# Patient Record
Sex: Male | Born: 1950 | Race: White | Hispanic: No | Marital: Married | Smoking: Never smoker
Health system: Southern US, Community
[De-identification: ages and names within clinical notes are randomized; demographics above are authoritative.]

## PROBLEM LIST (undated history)

## (undated) DIAGNOSIS — M1990 Unspecified osteoarthritis, unspecified site: Secondary | ICD-10-CM

## (undated) DIAGNOSIS — C801 Malignant (primary) neoplasm, unspecified: Secondary | ICD-10-CM

## (undated) DIAGNOSIS — A419 Sepsis, unspecified organism: Secondary | ICD-10-CM

## (undated) DIAGNOSIS — I499 Cardiac arrhythmia, unspecified: Secondary | ICD-10-CM

## (undated) DIAGNOSIS — K2980 Duodenitis without bleeding: Secondary | ICD-10-CM

## (undated) DIAGNOSIS — F32A Depression, unspecified: Secondary | ICD-10-CM

## (undated) DIAGNOSIS — Z87442 Personal history of urinary calculi: Secondary | ICD-10-CM

## (undated) DIAGNOSIS — F329 Major depressive disorder, single episode, unspecified: Secondary | ICD-10-CM

## (undated) DIAGNOSIS — N390 Urinary tract infection, site not specified: Secondary | ICD-10-CM

## (undated) DIAGNOSIS — I480 Paroxysmal atrial fibrillation: Secondary | ICD-10-CM

## (undated) DIAGNOSIS — R972 Elevated prostate specific antigen [PSA]: Secondary | ICD-10-CM

## (undated) HISTORY — PX: VASECTOMY: SHX75

## (undated) HISTORY — DX: Duodenitis without bleeding: K29.80

## (undated) HISTORY — DX: Depression, unspecified: F32.A

## (undated) HISTORY — PX: JOINT REPLACEMENT: SHX530

## (undated) HISTORY — PX: PROSTATE BIOPSY: SHX241

## (undated) HISTORY — DX: Major depressive disorder, single episode, unspecified: F32.9

## (undated) HISTORY — DX: Elevated prostate specific antigen (PSA): R97.20

## (undated) HISTORY — PX: APPENDECTOMY: SHX54

## (undated) HISTORY — DX: Paroxysmal atrial fibrillation: I48.0

## (undated) HISTORY — DX: Sepsis, unspecified organism: A41.9

## (undated) HISTORY — DX: Urinary tract infection, site not specified: N39.0

---

## 2004-04-19 ENCOUNTER — Emergency Department (HOSPITAL_COMMUNITY): Admission: EM | Admit: 2004-04-19 | Discharge: 2004-04-19 | Payer: Self-pay | Admitting: Emergency Medicine

## 2011-11-27 ENCOUNTER — Encounter: Payer: Self-pay | Admitting: *Deleted

## 2011-11-27 DIAGNOSIS — G4700 Insomnia, unspecified: Secondary | ICD-10-CM

## 2011-11-27 DIAGNOSIS — E785 Hyperlipidemia, unspecified: Secondary | ICD-10-CM

## 2011-11-27 DIAGNOSIS — R972 Elevated prostate specific antigen [PSA]: Secondary | ICD-10-CM

## 2011-11-27 DIAGNOSIS — F329 Major depressive disorder, single episode, unspecified: Secondary | ICD-10-CM

## 2011-12-04 ENCOUNTER — Other Ambulatory Visit: Payer: Self-pay | Admitting: Emergency Medicine

## 2011-12-04 ENCOUNTER — Ambulatory Visit (INDEPENDENT_AMBULATORY_CARE_PROVIDER_SITE_OTHER): Payer: 59 | Admitting: Emergency Medicine

## 2011-12-04 VITALS — BP 130/83 | HR 83 | Temp 98.1°F | Resp 16 | Ht 68.0 in | Wt 171.2 lb

## 2011-12-04 DIAGNOSIS — F339 Major depressive disorder, recurrent, unspecified: Secondary | ICD-10-CM

## 2011-12-04 DIAGNOSIS — E782 Mixed hyperlipidemia: Secondary | ICD-10-CM

## 2011-12-04 DIAGNOSIS — Z0000 Encounter for general adult medical examination without abnormal findings: Secondary | ICD-10-CM

## 2011-12-04 LAB — POCT URINALYSIS DIPSTICK
Blood, UA: NEGATIVE
Nitrite, UA: NEGATIVE
Protein, UA: NEGATIVE
Urobilinogen, UA: 0.2
pH, UA: 7

## 2011-12-04 LAB — POCT UA - MICROSCOPIC ONLY
Bacteria, U Microscopic: NEGATIVE
Casts, Ur, LPF, POC: NEGATIVE
Crystals, Ur, HPF, POC: NEGATIVE
Yeast, UA: NEGATIVE

## 2011-12-04 LAB — IFOBT (OCCULT BLOOD): IFOBT: NEGATIVE

## 2011-12-04 MED ORDER — ATORVASTATIN CALCIUM 10 MG PO TABS: 10.0000 mg | ORAL_TABLET | Freq: Every day | ORAL | Status: DC

## 2011-12-04 MED ORDER — PAROXETINE HCL 30 MG PO TABS: 30.0000 mg | ORAL_TABLET | ORAL | Status: DC

## 2011-12-04 MED ORDER — AMITRIPTYLINE HCL 25 MG PO TABS: 25.0000 mg | ORAL_TABLET | Freq: Every day | ORAL | Status: DC

## 2011-12-04 MED ORDER — ZOLPIDEM TARTRATE 10 MG PO TABS: 10.0000 mg | ORAL_TABLET | Freq: Every evening | ORAL | Status: DC | PRN

## 2011-12-04 MED ORDER — ASPIRIN 81 MG PO TABS: 81.0000 mg | ORAL_TABLET | Freq: Every day | ORAL | Status: DC

## 2011-12-04 MED ORDER — BUTALBITAL-APAP-CAFF-COD 50-325-40-30 MG PO CAPS: 1.0000 | ORAL_CAPSULE | ORAL | Status: DC | PRN

## 2011-12-04 NOTE — Progress Notes (Signed)
  Subjective:    Patient ID: Brian [name], male    DOB: November 28, 1950, 61 y.o.   MRN: [id number]  HPI patient enters for his annual physical exam    Review of Systems  Constitutional: Negative.   HENT: Negative.   Eyes: Positive for visual disturbance.       Patient wears glasses.  Respiratory: Negative.   Cardiovascular: Negative.   Gastrointestinal: Negative.   Genitourinary: Negative.        Patient is aware he has an elevated PSA. His PSA had fallen on his last check. We are rechecking a PSA today if it shows a trend of thenar will advise a urological consult. His PSAs have gone from 1.95-2.7 want but on repeat was down to 2.48.  Musculoskeletal: Negative.   Neurological:       Patient has intermittent problems with headaches but they are not debilitating. He takes over-the-counter medications for these.  Hematological: Negative.   Psychiatric/Behavioral:       Currently under treatment for depression with SSRIs.       Objective:   Physical Exam  Constitutional: He appears well-developed and well-nourished.  HENT:  Head: Normocephalic.  Right Ear: External ear normal.  Left Ear: External ear normal.  Nose: Nose normal.  Mouth/Throat: Oropharynx is clear and moist.  Eyes: Conjunctivae and EOM are normal. Pupils are equal, round, and reactive to light. Right eye exhibits no discharge. Left eye exhibits no discharge. No scleral icterus.  Neck: No JVD present. No tracheal deviation present. No thyromegaly present.  Cardiovascular: Normal rate, regular rhythm, normal heart sounds and intact distal pulses.  Exam reveals no gallop and no friction rub.   No murmur heard. Pulmonary/Chest: Breath sounds normal. No stridor. No respiratory distress. He has no wheezes. He has no rales. He exhibits no tenderness.  Genitourinary: Prostate normal. Guaiac negative stool. No penile tenderness.  Musculoskeletal: Normal range of motion. He exhibits no edema and no tenderness.  Lymphadenopathy:     He has no cervical adenopathy.  Neurological: He has normal reflexes. He displays normal reflexes. No cranial nerve deficit. He exhibits normal muscle tone. Coordination normal.  Skin: Skin is warm.  Psychiatric: He has a normal mood and affect. His behavior is normal. Judgment and thought content normal.          Assessment & Plan:  Patient is stable on his current medication regimen. His EKG was done that showed borderline left axis hemiblock but otherwise was unremarkable except for one PAC. His biggest issue has been a which on repeat last time was in normal range. He does suffer from depression which is well controlled with Paxil currently on Lipitor for lipid control. He understands that if his PSA spikes he will need to see the urologist.

## 2011-12-05 LAB — COMPREHENSIVE METABOLIC PANEL
Albumin: 4.5 g/dL (ref 3.5–5.2)
Alkaline Phosphatase: 104 U/L (ref 39–117)
BUN: 16 mg/dL (ref 6–23)
Calcium: 9.7 mg/dL (ref 8.4–10.5)
Glucose, Bld: 94 mg/dL (ref 70–99)
Potassium: 4.2 mEq/L (ref 3.5–5.3)

## 2011-12-05 LAB — CBC WITH DIFFERENTIAL/PLATELET
Basophils Relative: 0 % (ref 0–1)
Eosinophils Absolute: 0.1 10*3/uL (ref 0.0–0.7)
Eosinophils Relative: 2 % (ref 0–5)
HCT: 44.9 % (ref 39.0–52.0)
Hemoglobin: 15.4 g/dL (ref 13.0–17.0)
MCH: 29.8 pg (ref 26.0–34.0)
MCHC: 34.3 g/dL (ref 30.0–36.0)
MCV: 87 fL (ref 78.0–100.0)
Monocytes Absolute: 0.3 10*3/uL (ref 0.1–1.0)
Monocytes Relative: 5 % (ref 3–12)
Neutro Abs: 3.3 10*3/uL (ref 1.7–7.7)

## 2011-12-05 LAB — LIPID PANEL
HDL: 47 mg/dL (ref >39)
LDL Cholesterol: 91 mg/dL (ref 0–99)
Triglycerides: 173 mg/dL — ABNORMAL HIGH (ref <150)

## 2011-12-05 LAB — TSH: TSH: 2.341 u[IU]/mL (ref 0.350–4.500)

## 2011-12-29 ENCOUNTER — Other Ambulatory Visit: Payer: Self-pay | Admitting: Emergency Medicine

## 2012-08-07 ENCOUNTER — Telehealth: Payer: Self-pay | Admitting: *Deleted

## 2012-08-07 DIAGNOSIS — Z0000 Encounter for general adult medical examination without abnormal findings: Secondary | ICD-10-CM

## 2012-08-07 NOTE — Telephone Encounter (Signed)
Pharmacy requesting refill on Ambien 10mg  1 at bedtime.  Last filled 12/29/11.

## 2012-08-08 NOTE — Telephone Encounter (Signed)
It is okay to refill the patient's Ambien one at bedtime 10 mg. He can have 5 refills.

## 2012-08-09 MED ORDER — ZOLPIDEM TARTRATE 10 MG PO TABS: 10.0000 mg | ORAL_TABLET | Freq: Every evening | ORAL | Status: DC | PRN

## 2012-08-10 ENCOUNTER — Telehealth: Payer: Self-pay | Admitting: Family Medicine

## 2012-08-10 NOTE — Telephone Encounter (Signed)
Called in [place] 10mg  1 po qhs prn #[phone] 5 refills [facility] [address]

## 2012-12-09 ENCOUNTER — Encounter: Payer: Self-pay | Admitting: Emergency Medicine

## 2012-12-09 ENCOUNTER — Ambulatory Visit (INDEPENDENT_AMBULATORY_CARE_PROVIDER_SITE_OTHER): Payer: 59 | Admitting: Emergency Medicine

## 2012-12-09 VITALS — BP 114/90 | HR 81 | Temp 97.9°F | Resp 16 | Ht 67.75 in | Wt 168.0 lb

## 2012-12-09 DIAGNOSIS — F32A Depression, unspecified: Secondary | ICD-10-CM

## 2012-12-09 DIAGNOSIS — F329 Major depressive disorder, single episode, unspecified: Secondary | ICD-10-CM

## 2012-12-09 DIAGNOSIS — Z0000 Encounter for general adult medical examination without abnormal findings: Secondary | ICD-10-CM

## 2012-12-09 DIAGNOSIS — F341 Dysthymic disorder: Secondary | ICD-10-CM

## 2012-12-09 DIAGNOSIS — R51 Headache: Secondary | ICD-10-CM

## 2012-12-09 DIAGNOSIS — Z79899 Other long term (current) drug therapy: Secondary | ICD-10-CM

## 2012-12-09 DIAGNOSIS — G4700 Insomnia, unspecified: Secondary | ICD-10-CM

## 2012-12-09 DIAGNOSIS — E7800 Pure hypercholesterolemia, unspecified: Secondary | ICD-10-CM

## 2012-12-09 DIAGNOSIS — E782 Mixed hyperlipidemia: Secondary | ICD-10-CM

## 2012-12-09 LAB — CBC WITH DIFFERENTIAL/PLATELET
Basophils Absolute: 0 10*3/uL (ref 0.0–0.1)
Basophils Relative: 1 % (ref 0–1)
Eosinophils Absolute: 0.1 10*3/uL (ref 0.0–0.7)
Eosinophils Relative: 2 % (ref 0–5)
HCT: 43.2 % (ref 39.0–52.0)
MCHC: 35 g/dL (ref 30.0–36.0)
MCV: 81.4 fL (ref 78.0–100.0)
Monocytes Absolute: 0.4 10*3/uL (ref 0.1–1.0)
RDW: 13.9 % (ref 11.5–15.5)

## 2012-12-09 LAB — POCT URINALYSIS DIPSTICK
Protein, UA: NEGATIVE
Spec Grav, UA: 1.02
Urobilinogen, UA: 0.2

## 2012-12-09 LAB — LIPID PANEL
HDL: 49 mg/dL (ref >39)
LDL Cholesterol: 86 mg/dL (ref 0–99)
Triglycerides: 93 mg/dL (ref <150)
VLDL: 19 mg/dL (ref 0–40)

## 2012-12-09 LAB — IFOBT (OCCULT BLOOD): IFOBT: NEGATIVE

## 2012-12-09 LAB — COMPREHENSIVE METABOLIC PANEL
AST: 23 U/L (ref 0–37)
Alkaline Phosphatase: 105 U/L (ref 39–117)
BUN: 13 mg/dL (ref 6–23)
Calcium: 9.5 mg/dL (ref 8.4–10.5)
Creat: 0.93 mg/dL (ref 0.50–1.35)

## 2012-12-09 LAB — POCT UA - MICROSCOPIC ONLY
Bacteria, U Microscopic: NEGATIVE
Casts, Ur, LPF, POC: NEGATIVE
Crystals, Ur, HPF, POC: NEGATIVE
Yeast, UA: NEGATIVE

## 2012-12-09 MED ORDER — ATORVASTATIN CALCIUM 10 MG PO TABS: 10.0000 mg | ORAL_TABLET | Freq: Every day | ORAL | Status: DC

## 2012-12-09 MED ORDER — AMITRIPTYLINE HCL 25 MG PO TABS: 25.0000 mg | ORAL_TABLET | Freq: Every day | ORAL | Status: DC

## 2012-12-09 MED ORDER — BUTALBITAL-APAP-CAFF-COD 50-325-40-30 MG PO CAPS: 1.0000 | ORAL_CAPSULE | ORAL | Status: DC | PRN

## 2012-12-09 MED ORDER — ZOLPIDEM TARTRATE 10 MG PO TABS: 10.0000 mg | ORAL_TABLET | Freq: Every evening | ORAL | Status: DC | PRN

## 2012-12-09 MED ORDER — PAROXETINE HCL 30 MG PO TABS
ORAL_TABLET | ORAL | Status: DC
Start: 2012-12-09 — End: 2013-12-22

## 2012-12-09 NOTE — Progress Notes (Signed)
  Subjective:    Patient ID: Kamilo Och, male    DOB: [birth year], 62 y.o.   MRN: [id number]  HPI    Review of Systems  Constitutional: Negative.   HENT: Negative.   Eyes: Negative.   Respiratory: Negative.   Cardiovascular: Negative.   Gastrointestinal: Negative.   Endocrine: Negative.   Genitourinary: Positive for frequency.  Musculoskeletal: Negative.   Skin: Negative.   Allergic/Immunologic: Negative.   Neurological: Negative.   Hematological: Negative.   Psychiatric/Behavioral: Negative.        Objective:   Physical Exam HEENT exam is unremarkable. Neck is supple no carotid bruits are heard. Chest is clear to auscultation and percussion. Heart regular rate no murmurs rubs or gallops appreciated. Abdomen is soft liver and spleen not enlarged there are no areas of tenderness. Rectal exam shows a normal-sized prostate no rectal masses are felt . Neurological exam is normal cranial nerves II through XII intact motor 5 out of 5 all muscle groups deep tendon reflexes are symmetrical. Skin exam reveals no suspicious lesions        Assessment & Plan:  Patient stable as present. Meds were all refilled.

## 2013-04-15 ENCOUNTER — Other Ambulatory Visit: Payer: Self-pay | Admitting: Emergency Medicine

## 2013-09-22 ENCOUNTER — Ambulatory Visit (INDEPENDENT_AMBULATORY_CARE_PROVIDER_SITE_OTHER): Payer: 59 | Admitting: Physician Assistant

## 2013-09-22 VITALS — BP 130/80 | HR 103 | Temp 99.2°F | Resp 18 | Ht 68.0 in | Wt 167.0 lb

## 2013-09-22 DIAGNOSIS — J029 Acute pharyngitis, unspecified: Secondary | ICD-10-CM

## 2013-09-22 LAB — POCT RAPID STREP A (OFFICE): RAPID STREP A SCREEN: NEGATIVE

## 2013-09-22 MED ORDER — BENZONATATE 100 MG PO CAPS: 100.0000 mg | ORAL_CAPSULE | Freq: Three times a day (TID) | ORAL | Status: DC | PRN

## 2013-09-22 MED ORDER — CETIRIZINE HCL 10 MG PO TABS: 10.0000 mg | ORAL_TABLET | Freq: Every day | ORAL | Status: DC

## 2013-09-22 MED ORDER — FIRST-DUKES MOUTHWASH MT SUSP: 10.0000 mL | OROMUCOSAL | Status: DC | PRN

## 2013-09-22 NOTE — Progress Notes (Signed)
   Subjective:    Patient ID: Brian [name], male    DOB: 10/30/1950, 63 y.o.   MRN: [id number]  HPI   Brian [name] a very pleasant 63 yr old male here with concern for illness.  Reports onset of sore throat 3 days ago.  Worsening since that time.  Swallowing [name] painful.  Has  Been unable to eat due to pain.  Difficulty sleeping due to pain.  Periodic cough and some nasal drainage.  Felt achy but not feverish.  Low grad temp here at triage.  Possibly some PND.  No drooling, voice change.  No known sick contacts.  Was with grandchildren at Christmas but they were/are well.  Has been sucking on candy to keep throat moist.  Has taken a couple excedrin for achiness.  No flu shot this season.   Review of Systems  Constitutional: Negative for fever and chills.  HENT: Positive for congestion (slight), rhinorrhea (slight) and sore throat.   Respiratory: Positive for cough (slight).   Cardiovascular: Negative.   Gastrointestinal: Negative.   Musculoskeletal: Negative.   Skin: Negative.   Neurological: Negative for headaches.       Objective:   Physical Exam  Vitals reviewed. Constitutional: He [name] oriented to person, place, and time. He appears well-developed and well-nourished. No distress.  HENT:  Head: Normocephalic and atraumatic.  Right Ear: Tympanic membrane and ear canal normal.  Left Ear: Tympanic membrane and ear canal normal.  Mouth/Throat: Uvula [name] midline and mucous membranes are normal. Posterior oropharyngeal edema and posterior oropharyngeal erythema present. No oropharyngeal exudate or tonsillar abscesses.  Eyes: Conjunctivae are normal. No scleral icterus.  Neck: Neck supple.  Cardiovascular: Normal rate, regular rhythm and normal heart sounds.   Pulmonary/Chest: Effort normal and breath sounds normal. He has no wheezes. He has no rales.  Lymphadenopathy:    He has no cervical adenopathy.  Neurological: He [name] alert and oriented to person, place, and time.  Skin: Skin [name] warm  and dry.  Psychiatric: He has a normal mood and affect. His behavior [name] normal.   Results for orders placed in visit on 09/22/13  POCT RAPID STREP A (OFFICE)      Result Value Range   Rapid Strep A Screen Negative  Negative        Assessment & Plan:  Acute pharyngitis - Plan: POCT rapid strep A, Culture, Group A Strep, Diphenhyd-Hydrocort-Nystatin (FIRST-DUKES MOUTHWASH) SUSP, cetirizine (ZYRTEC) 10 MG tablet   Brian [name] a pleasant 63 yr old male here with acute pharyngitis.  Rapid strep [name] negative.  Cx pending.  Suspect viral etiology due to associated congestion, cough, mild laryngitis.  Supportive care with Zyrtec, Advil, Magic Mouthwash. Tessalon Perles if needed for coug.  Push fluids, rest.  Call or RTC if worsening or not improving.  Will follow up on cx results and treat if necessary.   Loleta DickerE. Elizabeth Saraiah Bhat MHS, PA-C [facility] 1/6/20151:10 PM

## 2013-09-22 NOTE — Patient Instructions (Signed)
Strep test is negative today.  I will let you know when your culture is back.  I suspect your symptoms are due to a viral throat infection.  Begin taking the cetirizine once daily to help with any post-nasal drainage.  Take ibuprofen 600-800mg  every 8 hours with food to help with throat pain.  Use the magic mouthwash every 2 hours as needed for throat pain.  Plenty of fluids (water is best!) and rest.  If anything is worsening or not improving, please let us know   Viral Pharyngitis Viral pharyngitis is a viral infection that produces redness, pain, and swelling (inflammation) of the throat. It can spread from person to person (contagious). CAUSES Viral pharyngitis is caused by inhaling a large amount of certain germs called viruses. Many different viruses cause viral pharyngitis. SYMPTOMS Symptoms of viral pharyngitis include:  Sore throat.  Tiredness.  Stuffy nose.  Low-grade fever.  Congestion.  Cough. TREATMENT Treatment includes rest, drinking plenty of fluids, and the use of over-the-counter medication (approved by your caregiver). HOME CARE INSTRUCTIONS   Drink enough fluids to keep your urine clear or pale yellow.  Eat soft, cold foods such as ice cream, frozen ice pops, or gelatin dessert.  Gargle with warm salt water (1 tsp salt per 1 qt of water).  If over age 647, throat lozenges may be used safely.  Only take over-the-counter or prescription medicines for pain, discomfort, or fever as directed by your caregiver. Do not take aspirin. To help prevent spreading viral pharyngitis to others, avoid:  Mouth-to-mouth contact with others.  Sharing utensils for eating and drinking.  Coughing around others. SEEK MEDICAL CARE IF:   You are better in a few days, then become worse.  You have a fever or pain not helped by pain medicines.  There are any other changes that concern you. Document Released: 06/13/2005 Document Revised: 11/26/2011 Document Reviewed:  11/09/2010 [facility] Patient Information 2014 [employer], [place].

## 2013-09-24 LAB — CULTURE, GROUP A STREP: Organism ID, Bacteria: NORMAL

## 2013-11-17 ENCOUNTER — Encounter: Payer: 59 | Admitting: Emergency Medicine

## 2013-12-22 ENCOUNTER — Encounter: Payer: Self-pay | Admitting: Emergency Medicine

## 2013-12-22 ENCOUNTER — Ambulatory Visit (INDEPENDENT_AMBULATORY_CARE_PROVIDER_SITE_OTHER): Payer: 59 | Admitting: Emergency Medicine

## 2013-12-22 VITALS — BP 112/82 | HR 82 | Temp 98.5°F | Resp 16 | Ht 67.5 in | Wt 166.0 lb

## 2013-12-22 DIAGNOSIS — Z79899 Other long term (current) drug therapy: Secondary | ICD-10-CM

## 2013-12-22 DIAGNOSIS — F329 Major depressive disorder, single episode, unspecified: Secondary | ICD-10-CM

## 2013-12-22 DIAGNOSIS — E7800 Pure hypercholesterolemia, unspecified: Secondary | ICD-10-CM

## 2013-12-22 DIAGNOSIS — Z23 Encounter for immunization: Secondary | ICD-10-CM

## 2013-12-22 DIAGNOSIS — E785 Hyperlipidemia, unspecified: Secondary | ICD-10-CM

## 2013-12-22 DIAGNOSIS — F32A Depression, unspecified: Secondary | ICD-10-CM

## 2013-12-22 DIAGNOSIS — Z0000 Encounter for general adult medical examination without abnormal findings: Secondary | ICD-10-CM

## 2013-12-22 DIAGNOSIS — G4700 Insomnia, unspecified: Secondary | ICD-10-CM

## 2013-12-22 LAB — CBC WITH DIFFERENTIAL/PLATELET
Basophils Absolute: 0.1 10*3/uL (ref 0.0–0.1)
Basophils Relative: 1 % (ref 0–1)
Eosinophils Absolute: 0.1 10*3/uL (ref 0.0–0.7)
Eosinophils Relative: 2 % (ref 0–5)
HEMATOCRIT: 42.3 % (ref 39.0–52.0)
HEMOGLOBIN: 14.8 g/dL (ref 13.0–17.0)
LYMPHS PCT: 38 % (ref 12–46)
Lymphs Abs: 2.1 10*3/uL (ref 0.7–4.0)
MCH: 29.8 pg (ref 26.0–34.0)
MCHC: 35 g/dL (ref 30.0–36.0)
MCV: 85.1 fL (ref 78.0–100.0)
MONO ABS: 0.4 10*3/uL (ref 0.1–1.0)
MONOS PCT: 7 % (ref 3–12)
Neutro Abs: 2.8 10*3/uL (ref 1.7–7.7)
Neutrophils Relative %: 52 % (ref 43–77)
Platelets: 197 10*3/uL (ref 150–400)
RBC: 4.97 MIL/uL (ref 4.22–5.81)
RDW: 14.4 % (ref 11.5–15.5)
WBC: 5.4 10*3/uL (ref 4.0–10.5)

## 2013-12-22 LAB — POCT UA - MICROSCOPIC ONLY
CRYSTALS, UR, HPF, POC: NEGATIVE
Casts, Ur, LPF, POC: NEGATIVE
Mucus, UA: POSITIVE
YEAST UA: NEGATIVE

## 2013-12-22 LAB — COMPLETE METABOLIC PANEL WITH GFR
ALBUMIN: 4.3 g/dL (ref 3.5–5.2)
ALT: 17 U/L (ref 0–53)
AST: 20 U/L (ref 0–37)
Alkaline Phosphatase: 97 U/L (ref 39–117)
BUN: 16 mg/dL (ref 6–23)
CALCIUM: 9.5 mg/dL (ref 8.4–10.5)
CHLORIDE: 103 meq/L (ref 96–112)
CO2: 24 mEq/L (ref 19–32)
CREATININE: 0.91 mg/dL (ref 0.50–1.35)
GLUCOSE: 90 mg/dL (ref 70–99)
POTASSIUM: 4.2 meq/L (ref 3.5–5.3)
Sodium: 137 mEq/L (ref 135–145)
Total Bilirubin: 0.5 mg/dL (ref 0.2–1.2)
Total Protein: 6.6 g/dL (ref 6.0–8.3)

## 2013-12-22 LAB — POCT URINALYSIS DIPSTICK
Blood, UA: NEGATIVE
Glucose, UA: NEGATIVE
Ketones, UA: NEGATIVE
Leukocytes, UA: NEGATIVE
NITRITE UA: NEGATIVE
PROTEIN UA: 30
UROBILINOGEN UA: 0.2
pH, UA: 5.5

## 2013-12-22 LAB — LIPID PANEL
Cholesterol: 131 mg/dL (ref 0–200)
HDL: 46 mg/dL (ref >39)
LDL Cholesterol: 66 mg/dL (ref 0–99)
TRIGLYCERIDES: 94 mg/dL (ref <150)
Total CHOL/HDL Ratio: 2.8 Ratio
VLDL: 19 mg/dL (ref 0–40)

## 2013-12-22 LAB — IFOBT (OCCULT BLOOD): IFOBT: NEGATIVE

## 2013-12-22 MED ORDER — PAROXETINE HCL 30 MG PO TABS: ORAL_TABLET | ORAL | Status: DC

## 2013-12-22 MED ORDER — ZOLPIDEM TARTRATE 10 MG PO TABS: ORAL_TABLET | ORAL | Status: DC

## 2013-12-22 MED ORDER — ATORVASTATIN CALCIUM 10 MG PO TABS: 10.0000 mg | ORAL_TABLET | Freq: Every day | ORAL | Status: DC

## 2013-12-22 MED ORDER — BUTALBITAL-APAP-CAFFEINE 50-325-40 MG PO TABS: 1.0000 | ORAL_TABLET | Freq: Four times a day (QID) | ORAL | Status: DC | PRN

## 2013-12-22 MED ORDER — AMITRIPTYLINE HCL 25 MG PO TABS: 25.0000 mg | ORAL_TABLET | Freq: Every day | ORAL | Status: DC

## 2013-12-22 NOTE — Progress Notes (Signed)
  This chart was scribed for Collene GobbleSteven A Lorretta Kerce, MD by Joaquin MusicKristina Sanchez-Matthews, ED Scribe. This patient was seen in room Room/bed 22 and the patient's care was started at 4:14 PM;. Subjective:    Patient ID: Brian BellowWilliam Segovia, male    DOB: 05/04/1951, 63 y.o.   MRN: [id number] Chief Complaint  Patient presents with  . Annual Exam   HPI Brian BellowWilliam Bottoms is a 63 y.o. male [facility]. 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 He states he is in need of a tetanus shot due to his last tetanus being 11 years ago. Pt states he has HA about once every 2 weeks and reports taking OTC medications with relief. Pt denies having a stress test, CP, palpitations, and cardiac work-up.  Review of Systems  Constitutional: Negative for activity change.  Cardiovascular: Negative for chest pain and palpitations.  Neurological: Positive for headaches.  All other systems reviewed and are negative.   Objective:   Physical Exam CONSTITUTIONAL: Well developed/well nourished HEAD: Normocephalic/atraumatic EYES: EOMI/PERRL ENMT: Mucous membranes moist NECK: supple no meningeal signs SPINE:entire spine nontender CV: S1/S2 noted, no murmurs/rubs/gallops noted LUNGS: Lungs are clear to auscultation bilaterally, no apparent distress ABDOMEN: soft, nontender, no rebound or guarding GU:no cva tenderness. Rectal and prostate exam was all normal. NEURO: Pt is awake/alert, moves all extremitiesx4 EXTREMITIES: pulses normal, full ROM SKIN: warm, color normal PSYCH: no abnormalities of mood noted   Triage Vitals:BP 112/82  Pulse 82  Temp(Src) 98.5 F (36.9 C) (Oral)  Resp 16  Ht 5' 7.5" (1.715 m)  Wt 166 lb (75.297 kg)  BMI 25.60 kg/m2  SpO2 96% Assessment & Plan:      I personally performed the services described in this documentation, which was scribed in my  presence. The recorded information has been reviewed and is accurate.

## 2013-12-23 LAB — PSA: PSA: 1.87 ng/mL (ref <=4.00)

## 2014-01-22 HISTORY — PX: CARDIOVASCULAR STRESS TEST: SHX262

## 2014-02-15 HISTORY — PX: TRANSTHORACIC ECHOCARDIOGRAM: SHX275

## 2014-03-24 ENCOUNTER — Encounter: Payer: Self-pay | Admitting: Emergency Medicine

## 2014-05-10 ENCOUNTER — Telehealth: Payer: Self-pay

## 2014-05-10 DIAGNOSIS — G4700 Insomnia, unspecified: Secondary | ICD-10-CM

## 2014-05-10 MED ORDER — AMITRIPTYLINE HCL 25 MG PO TABS: 25.0000 mg | ORAL_TABLET | Freq: Every day | ORAL | Status: DC

## 2014-05-10 NOTE — Telephone Encounter (Signed)
[name] states her husband have been trying to get a 3 months supply of his Elavil s for a while and hasn't heard from anyone. Please call [phone]    [employer]

## 2014-05-10 NOTE — Telephone Encounter (Signed)
Okay to refill all medicines for 90 days 

## 2014-05-10 NOTE — Telephone Encounter (Signed)
Pt will be in for his annual CPE in October- insurance now requires 90 supply. Please advise.

## 2014-05-13 MED ORDER — AMITRIPTYLINE HCL 25 MG PO TABS: 25.0000 mg | ORAL_TABLET | Freq: Every day | ORAL | Status: DC

## 2014-05-13 NOTE — Addendum Note (Signed)
Addended by: Sheppard Plumber A on: 05/13/2014 01:08 PM   Modules accepted: Orders

## 2014-05-13 NOTE — Telephone Encounter (Signed)
Sent refill for 90 day supply and notified pt's wife.

## 2014-08-13 ENCOUNTER — Other Ambulatory Visit: Payer: Self-pay | Admitting: Emergency Medicine

## 2014-08-25 ENCOUNTER — Other Ambulatory Visit: Payer: Self-pay | Admitting: Emergency Medicine

## 2014-08-27 NOTE — Telephone Encounter (Signed)
Faxed

## 2014-12-20 ENCOUNTER — Telehealth: Payer: Self-pay | Admitting: Family Medicine

## 2014-12-20 NOTE — Telephone Encounter (Signed)
lmom to give us a call back to the appt. Center his new appt date and time is with Dr Cleta Albertsaub on Friday 12-24-14 at 9:15 Dr Cleta Albertsaub will be out of clinic on Thursday so we're moving his patient to Friday

## 2014-12-23 ENCOUNTER — Encounter: Payer: Self-pay | Admitting: Emergency Medicine

## 2014-12-24 ENCOUNTER — Encounter: Payer: Self-pay | Admitting: Emergency Medicine

## 2014-12-24 ENCOUNTER — Other Ambulatory Visit: Payer: Self-pay | Admitting: Emergency Medicine

## 2014-12-24 ENCOUNTER — Ambulatory Visit (INDEPENDENT_AMBULATORY_CARE_PROVIDER_SITE_OTHER): Payer: 59 | Admitting: Emergency Medicine

## 2014-12-24 VITALS — BP 123/83 | HR 89 | Temp 97.9°F | Resp 16 | Ht 68.0 in | Wt 169.0 lb

## 2014-12-24 DIAGNOSIS — F329 Major depressive disorder, single episode, unspecified: Secondary | ICD-10-CM

## 2014-12-24 DIAGNOSIS — E7800 Pure hypercholesterolemia, unspecified: Secondary | ICD-10-CM

## 2014-12-24 DIAGNOSIS — Z0000 Encounter for general adult medical examination without abnormal findings: Secondary | ICD-10-CM | POA: Diagnosis not present

## 2014-12-24 DIAGNOSIS — R972 Elevated prostate specific antigen [PSA]: Secondary | ICD-10-CM

## 2014-12-24 DIAGNOSIS — F32A Depression, unspecified: Secondary | ICD-10-CM

## 2014-12-24 LAB — CBC WITH DIFFERENTIAL/PLATELET
BASOS PCT: 1 % (ref 0–1)
Basophils Absolute: 0.1 10*3/uL (ref 0.0–0.1)
EOS ABS: 0.2 10*3/uL (ref 0.0–0.7)
Eosinophils Relative: 3 % (ref 0–5)
HCT: 42.6 % (ref 39.0–52.0)
Hemoglobin: 14.4 g/dL (ref 13.0–17.0)
Lymphocytes Relative: 33 % (ref 12–46)
Lymphs Abs: 1.9 10*3/uL (ref 0.7–4.0)
MCH: 29.6 pg (ref 26.0–34.0)
MCHC: 33.8 g/dL (ref 30.0–36.0)
MCV: 87.7 fL (ref 78.0–100.0)
MPV: 10.1 fL (ref 8.6–12.4)
Monocytes Absolute: 0.5 10*3/uL (ref 0.1–1.0)
Monocytes Relative: 8 % (ref 3–12)
NEUTROS PCT: 55 % (ref 43–77)
Neutro Abs: 3.2 10*3/uL (ref 1.7–7.7)
Platelets: 200 10*3/uL (ref 150–400)
RBC: 4.86 MIL/uL (ref 4.22–5.81)
RDW: 14.6 % (ref 11.5–15.5)
WBC: 5.8 10*3/uL (ref 4.0–10.5)

## 2014-12-24 LAB — POCT URINALYSIS DIPSTICK
Bilirubin, UA: NEGATIVE
GLUCOSE UA: NEGATIVE
KETONES UA: NEGATIVE
NITRITE UA: NEGATIVE
Protein, UA: NEGATIVE
Spec Grav, UA: 1.015
Urobilinogen, UA: 0.2
pH, UA: 7

## 2014-12-24 LAB — COMPLETE METABOLIC PANEL WITH GFR
ALK PHOS: 106 U/L (ref 39–117)
ALT: 21 U/L (ref 0–53)
AST: 25 U/L (ref 0–37)
Albumin: 4 g/dL (ref 3.5–5.2)
BUN: 17 mg/dL (ref 6–23)
CO2: 28 meq/L (ref 19–32)
Calcium: 9.3 mg/dL (ref 8.4–10.5)
Chloride: 102 mEq/L (ref 96–112)
Creat: 0.96 mg/dL (ref 0.50–1.35)
GFR, Est Non African American: 84 mL/min
Glucose, Bld: 84 mg/dL (ref 70–99)
POTASSIUM: 4.1 meq/L (ref 3.5–5.3)
SODIUM: 139 meq/L (ref 135–145)
TOTAL PROTEIN: 6.9 g/dL (ref 6.0–8.3)
Total Bilirubin: 0.4 mg/dL (ref 0.2–1.2)

## 2014-12-24 LAB — TSH: TSH: 1.905 u[IU]/mL (ref 0.350–4.500)

## 2014-12-24 LAB — LIPID PANEL
CHOL/HDL RATIO: 2.9 ratio
Cholesterol: 143 mg/dL (ref 0–200)
HDL: 50 mg/dL (ref >=40)
LDL Cholesterol: 79 mg/dL (ref 0–99)
Triglycerides: 71 mg/dL (ref <150)
VLDL: 14 mg/dL (ref 0–40)

## 2014-12-24 LAB — VITAMIN D 25 HYDROXY (VIT D DEFICIENCY, FRACTURES): Vit D, 25-Hydroxy: 33 ng/mL (ref 30–100)

## 2014-12-24 MED ORDER — BUTALBITAL-APAP-CAFFEINE 50-325-40 MG PO TABS: 1.0000 | ORAL_TABLET | Freq: Four times a day (QID) | ORAL | Status: DC | PRN

## 2014-12-24 MED ORDER — AMITRIPTYLINE HCL 25 MG PO TABS: 25.0000 mg | ORAL_TABLET | Freq: Every evening | ORAL | Status: DC | PRN

## 2014-12-24 MED ORDER — ATORVASTATIN CALCIUM 10 MG PO TABS: 10.0000 mg | ORAL_TABLET | Freq: Every day | ORAL | Status: DC

## 2014-12-24 MED ORDER — ZOLPIDEM TARTRATE 10 MG PO TABS: 10.0000 mg | ORAL_TABLET | Freq: Every day | ORAL | Status: DC

## 2014-12-24 MED ORDER — PAROXETINE HCL 30 MG PO TABS
ORAL_TABLET | ORAL | Status: DC
Start: 2014-12-24 — End: 2015-12-17

## 2014-12-24 NOTE — Progress Notes (Signed)
   Subjective:    Patient ID: Robert BellowWilliam Mcray, male    DOB: 08/30/1951, 64 y.o.   MRN: [id number]  HPI patient enters for his yearly physical exam. He feels well. He has no specific complaints today. He has been stable on his current medication regimen.    Review of Systems  Constitutional: Negative.   HENT: Negative.   Eyes: Negative.   Respiratory: Negative.   Cardiovascular: Negative.   Gastrointestinal: Negative.   Endocrine: Negative.   Genitourinary: Negative.   Musculoskeletal: Negative.   Skin: Negative.   Allergic/Immunologic: Negative.   Neurological: Negative.   Hematological: Negative.   Psychiatric/Behavioral: Negative.        Objective:   Physical Exam  Constitutional: He appears well-developed and well-nourished.  HENT:  Head: Normocephalic.  Eyes: Pupils are equal, round, and reactive to light.  Neck: No tracheal deviation present. No thyromegaly present.  Cardiovascular: Normal rate, regular rhythm, normal heart sounds and intact distal pulses.  Exam reveals no gallop and no friction rub.   No murmur heard. Pulmonary/Chest: Effort normal and breath sounds normal. No respiratory distress. He has no wheezes.  Abdominal: Soft. He exhibits no distension. There is no tenderness. There is no rebound.  Genitourinary: Prostate normal.  Musculoskeletal: Normal range of motion.  Neurological: He is alert. He has normal reflexes. No cranial nerve deficit.  Skin: Skin is warm and dry.  Psychiatric: He has a normal mood and affect. His behavior is normal. Judgment and thought content normal.   Results for orders placed or performed in visit on 12/24/14  POCT urinalysis dipstick  Result Value Ref Range   Color, UA yellow    Clarity, UA clear    Glucose, UA neg    Bilirubin, UA neg    Ketones, UA neg    Spec Grav, UA 1.015    Blood, UA newg    pH, UA 7.0    Protein, UA neg    Urobilinogen, UA 0.2    Nitrite, UA neg    Leukocytes, UA Trace    EKG  normal Meds  ordered this encounter  Medications  . zolpidem (AMBIEN) 10 MG tablet    Sig: Take 1 tablet (10 mg total) by mouth at bedtime.    Dispense:  30 tablet    Refill:  5  . PARoxetine (PAXIL) 30 MG tablet    Sig: TAKE ONE (1) TABLET EACH DAY    Dispense:  30 tablet    Refill:  11    NEED REFILLS FOR 1 YEAR  . butalbital-acetaminophen-caffeine (FIORICET, ESGIC) 50-325-40 MG per tablet    Sig: Take 1 tablet by mouth every 6 (six) hours as needed for headache.    Dispense:  30 tablet    Refill:  4  . atorvastatin (LIPITOR) 10 MG tablet    Sig: Take 1 tablet (10 mg total) by mouth daily.    Dispense:  30 tablet    Refill:  11  . amitriptyline (ELAVIL) 25 MG tablet    Sig: Take 1 tablet (25 mg total) by mouth at bedtime as needed for sleep. TAKE ONE TABLET BY MOUTH AT BEDTIME.   "NEEDS OFFICE VISIT FOR ADDITIONAL REFILLS"    Dispense:  30 tablet    Refill:  11      Assessment & Plan:  No changes were made in his medication regimen today. He is doing well and will continue his treatment plan.

## 2014-12-25 LAB — PSA: PSA: 2.71 ng/mL (ref <=4.00)

## 2014-12-29 ENCOUNTER — Encounter: Payer: Self-pay | Admitting: *Deleted

## 2015-06-21 ENCOUNTER — Encounter: Payer: Self-pay | Admitting: Emergency Medicine

## 2015-08-17 ENCOUNTER — Other Ambulatory Visit: Payer: Self-pay | Admitting: Emergency Medicine

## 2015-08-18 ENCOUNTER — Telehealth: Payer: Self-pay | Admitting: *Deleted

## 2015-08-18 NOTE — Telephone Encounter (Signed)
[facility] [address] per Dr Cleta Albertsaub. Patient was called and advised. Confirmation page received at 8:41 am.

## 2015-09-13 ENCOUNTER — Other Ambulatory Visit: Payer: Self-pay | Admitting: Emergency Medicine

## 2015-09-15 ENCOUNTER — Telehealth: Payer: Self-pay

## 2015-09-15 NOTE — Telephone Encounter (Signed)
Spoke to patient to get pharmacy information.  [facility] [address]  RX for fioricet, esgic 50-325-40 called in per Dr. Cleta Albertsaub.

## 2015-09-16 NOTE — Telephone Encounter (Signed)
Called in.

## 2015-09-18 DIAGNOSIS — I480 Paroxysmal atrial fibrillation: Secondary | ICD-10-CM

## 2015-09-18 HISTORY — DX: Paroxysmal atrial fibrillation: I48.0

## 2015-10-10 ENCOUNTER — Emergency Department (HOSPITAL_COMMUNITY): Payer: 59

## 2015-10-10 ENCOUNTER — Encounter (HOSPITAL_COMMUNITY): Payer: Self-pay

## 2015-10-10 ENCOUNTER — Inpatient Hospital Stay (HOSPITAL_COMMUNITY)
Admission: EM | Admit: 2015-10-10 | Discharge: 2015-10-12 | DRG: 871 | Disposition: A | Discharge status: Home / Self Care | Payer: 59 | Attending: Family Medicine | Admitting: Family Medicine

## 2015-10-10 DIAGNOSIS — R197 Diarrhea, unspecified: Secondary | ICD-10-CM

## 2015-10-10 DIAGNOSIS — R6521 Severe sepsis with septic shock: Secondary | ICD-10-CM | POA: Diagnosis present

## 2015-10-10 DIAGNOSIS — A419 Sepsis, unspecified organism: Secondary | ICD-10-CM

## 2015-10-10 DIAGNOSIS — E872 Acidosis: Secondary | ICD-10-CM | POA: Diagnosis present

## 2015-10-10 DIAGNOSIS — Z7982 Long term (current) use of aspirin: Secondary | ICD-10-CM

## 2015-10-10 DIAGNOSIS — I4891 Unspecified atrial fibrillation: Secondary | ICD-10-CM | POA: Diagnosis present

## 2015-10-10 DIAGNOSIS — Z823 Family history of stroke: Secondary | ICD-10-CM | POA: Diagnosis not present

## 2015-10-10 DIAGNOSIS — Z809 Family history of malignant neoplasm, unspecified: Secondary | ICD-10-CM | POA: Diagnosis not present

## 2015-10-10 DIAGNOSIS — F329 Major depressive disorder, single episode, unspecified: Secondary | ICD-10-CM | POA: Diagnosis present

## 2015-10-10 DIAGNOSIS — Z8249 Family history of ischemic heart disease and other diseases of the circulatory system: Secondary | ICD-10-CM | POA: Diagnosis not present

## 2015-10-10 DIAGNOSIS — I959 Hypotension, unspecified: Secondary | ICD-10-CM | POA: Diagnosis present

## 2015-10-10 DIAGNOSIS — E7800 Pure hypercholesterolemia, unspecified: Secondary | ICD-10-CM | POA: Diagnosis present

## 2015-10-10 DIAGNOSIS — Z87442 Personal history of urinary calculi: Secondary | ICD-10-CM | POA: Diagnosis not present

## 2015-10-10 DIAGNOSIS — N390 Urinary tract infection, site not specified: Secondary | ICD-10-CM | POA: Diagnosis not present

## 2015-10-10 DIAGNOSIS — R0682 Tachypnea, not elsewhere classified: Secondary | ICD-10-CM | POA: Diagnosis present

## 2015-10-10 DIAGNOSIS — K2980 Duodenitis without bleeding: Secondary | ICD-10-CM | POA: Diagnosis present

## 2015-10-10 DIAGNOSIS — E860 Dehydration: Secondary | ICD-10-CM | POA: Diagnosis present

## 2015-10-10 DIAGNOSIS — R1030 Lower abdominal pain, unspecified: Secondary | ICD-10-CM | POA: Diagnosis not present

## 2015-10-10 DIAGNOSIS — E785 Hyperlipidemia, unspecified: Secondary | ICD-10-CM | POA: Diagnosis present

## 2015-10-10 DIAGNOSIS — E876 Hypokalemia: Secondary | ICD-10-CM | POA: Diagnosis present

## 2015-10-10 DIAGNOSIS — F32A Depression, unspecified: Secondary | ICD-10-CM | POA: Diagnosis present

## 2015-10-10 DIAGNOSIS — R112 Nausea with vomiting, unspecified: Secondary | ICD-10-CM | POA: Diagnosis not present

## 2015-10-10 DIAGNOSIS — I480 Paroxysmal atrial fibrillation: Secondary | ICD-10-CM | POA: Clinically undetermined

## 2015-10-10 DIAGNOSIS — Z0000 Encounter for general adult medical examination without abnormal findings: Secondary | ICD-10-CM

## 2015-10-10 HISTORY — DX: Sepsis, unspecified organism: A41.9

## 2015-10-10 HISTORY — DX: Duodenitis without bleeding: K29.80

## 2015-10-10 LAB — COMPREHENSIVE METABOLIC PANEL
ALBUMIN: 4.2 g/dL (ref 3.5–5.0)
ALK PHOS: 103 U/L (ref 38–126)
ALT: 21 U/L (ref 17–63)
AST: 30 U/L (ref 15–41)
Anion gap: 16 — ABNORMAL HIGH (ref 5–15)
BILIRUBIN TOTAL: 1.4 mg/dL — AB (ref 0.3–1.2)
BUN: 25 mg/dL — AB (ref 6–20)
CO2: 21 mmol/L — ABNORMAL LOW (ref 22–32)
CREATININE: 1.11 mg/dL (ref 0.61–1.24)
Calcium: 9.4 mg/dL (ref 8.9–10.3)
Chloride: 107 mmol/L (ref 101–111)
GFR calc Af Amer: >60 mL/min (ref >60)
GLUCOSE: 144 mg/dL — AB (ref 65–99)
Potassium: 3.9 mmol/L (ref 3.5–5.1)
Sodium: 144 mmol/L (ref 135–145)
TOTAL PROTEIN: 6.9 g/dL (ref 6.5–8.1)

## 2015-10-10 LAB — CBC
HCT: 44.9 % (ref 39.0–52.0)
Hemoglobin: 15.2 g/dL (ref 13.0–17.0)
MCH: 30.3 pg (ref 26.0–34.0)
MCHC: 33.9 g/dL (ref 30.0–36.0)
MCV: 89.6 fL (ref 78.0–100.0)
PLATELETS: 164 10*3/uL (ref 150–400)
RBC: 5.01 MIL/uL (ref 4.22–5.81)
RDW: 12.8 % (ref 11.5–15.5)
WBC: 9.1 10*3/uL (ref 4.0–10.5)

## 2015-10-10 LAB — URINE MICROSCOPIC-ADD ON: RBC / HPF: NONE SEEN RBC/hpf (ref 0–5)

## 2015-10-10 LAB — URINALYSIS, ROUTINE W REFLEX MICROSCOPIC
GLUCOSE, UA: NEGATIVE mg/dL
Hgb urine dipstick: NEGATIVE
KETONES UR: 40 mg/dL — AB
NITRITE: NEGATIVE
PH: 8.5 — AB (ref 5.0–8.0)
PROTEIN: NEGATIVE mg/dL
Specific Gravity, Urine: 1.019 (ref 1.005–1.030)

## 2015-10-10 LAB — LACTIC ACID, PLASMA
LACTIC ACID, VENOUS: 1.1 mmol/L (ref 0.5–2.0)
Lactic Acid, Venous: 0.7 mmol/L (ref 0.5–2.0)

## 2015-10-10 LAB — I-STAT CG4 LACTIC ACID, ED
LACTIC ACID, VENOUS: 1.93 mmol/L (ref 0.5–2.0)
Lactic Acid, Venous: 2.35 mmol/L (ref 0.5–2.0)

## 2015-10-10 LAB — POC OCCULT BLOOD, ED: FECAL OCCULT BLD: NEGATIVE

## 2015-10-10 LAB — LIPASE, BLOOD: Lipase: 31 U/L (ref 11–51)

## 2015-10-10 LAB — PROCALCITONIN: Procalcitonin: 0.56 ng/mL

## 2015-10-10 MED ORDER — IOHEXOL 300 MG/ML  SOLN
25.0000 mL | Freq: Once | INTRAMUSCULAR | Status: AC | PRN
  Administered 2015-10-10: 25 mL via ORAL

## 2015-10-10 MED ORDER — ACETAMINOPHEN 650 MG RE SUPP: 650.0000 mg | Freq: Four times a day (QID) | RECTAL | Status: DC | PRN

## 2015-10-10 MED ORDER — SODIUM CHLORIDE 0.9 % IV SOLN: INTRAVENOUS | Status: DC

## 2015-10-10 MED ORDER — MORPHINE SULFATE (PF) 2 MG/ML IV SOLN
2.0000 mg | Freq: Once | INTRAVENOUS | Status: AC
  Administered 2015-10-10: 2 mg via INTRAVENOUS
  Filled 2015-10-10: qty 1

## 2015-10-10 MED ORDER — PROMETHAZINE HCL 25 MG PO TABS
12.5000 mg | ORAL_TABLET | Freq: Four times a day (QID) | ORAL | Status: DC | PRN
  Administered 2015-10-10: 12.5 mg via ORAL
  Filled 2015-10-10: qty 1

## 2015-10-10 MED ORDER — VANCOMYCIN HCL IN DEXTROSE 1-5 GM/200ML-% IV SOLN
1000.0000 mg | Freq: Two times a day (BID) | INTRAVENOUS | Status: DC
  Administered 2015-10-10 – 2015-10-12 (×4): 1000 mg via INTRAVENOUS
  Filled 2015-10-10 (×4): qty 200

## 2015-10-10 MED ORDER — PAROXETINE HCL 30 MG PO TABS
30.0000 mg | ORAL_TABLET | Freq: Every day | ORAL | Status: DC
  Administered 2015-10-10 – 2015-10-12 (×3): 30 mg via ORAL
  Filled 2015-10-10 (×3): qty 1

## 2015-10-10 MED ORDER — PANTOPRAZOLE SODIUM 40 MG IV SOLR
40.0000 mg | Freq: Two times a day (BID) | INTRAVENOUS | Status: DC
  Administered 2015-10-10 – 2015-10-12 (×5): 40 mg via INTRAVENOUS
  Filled 2015-10-10 (×6): qty 40

## 2015-10-10 MED ORDER — BUTALBITAL-APAP-CAFFEINE 50-325-40 MG PO TABS
1.0000 | ORAL_TABLET | Freq: Four times a day (QID) | ORAL | Status: DC | PRN
  Administered 2015-10-10: 1 via ORAL
  Filled 2015-10-10: qty 1

## 2015-10-10 MED ORDER — SODIUM CHLORIDE 0.9 % IV BOLUS (SEPSIS)
1000.0000 mL | Freq: Once | INTRAVENOUS | Status: AC
  Administered 2015-10-10: 1000 mL via INTRAVENOUS

## 2015-10-10 MED ORDER — LORAZEPAM 2 MG/ML IJ SOLN
1.0000 mg | Freq: Once | INTRAMUSCULAR | Status: DC
  Filled 2015-10-10: qty 1

## 2015-10-10 MED ORDER — SODIUM CHLORIDE 0.9 % IV SOLN
INTRAVENOUS | Status: DC
  Administered 2015-10-10 (×2): via INTRAVENOUS

## 2015-10-10 MED ORDER — SODIUM CHLORIDE 0.9 % IJ SOLN
3.0000 mL | Freq: Two times a day (BID) | INTRAMUSCULAR | Status: DC
  Administered 2015-10-10 – 2015-10-11 (×3): 3 mL via INTRAVENOUS

## 2015-10-10 MED ORDER — ACETAMINOPHEN 325 MG PO TABS: 650.0000 mg | ORAL_TABLET | Freq: Four times a day (QID) | ORAL | Status: DC | PRN

## 2015-10-10 MED ORDER — AMITRIPTYLINE HCL 25 MG PO TABS
25.0000 mg | ORAL_TABLET | Freq: Every evening | ORAL | Status: DC | PRN
  Filled 2015-10-10: qty 1

## 2015-10-10 MED ORDER — PROMETHAZINE HCL 25 MG/ML IJ SOLN
25.0000 mg | Freq: Once | INTRAMUSCULAR | Status: AC
  Administered 2015-10-10: 25 mg via INTRAVENOUS
  Filled 2015-10-10: qty 1

## 2015-10-10 MED ORDER — MORPHINE SULFATE (PF) 4 MG/ML IV SOLN
4.0000 mg | Freq: Once | INTRAVENOUS | Status: DC
  Filled 2015-10-10: qty 1

## 2015-10-10 MED ORDER — IOHEXOL 300 MG/ML  SOLN
100.0000 mL | Freq: Once | INTRAMUSCULAR | Status: AC | PRN
  Administered 2015-10-10: 100 mL via INTRAVENOUS

## 2015-10-10 MED ORDER — ZOLPIDEM TARTRATE 10 MG PO TABS
10.0000 mg | ORAL_TABLET | Freq: Every day | ORAL | Status: DC
  Administered 2015-10-10 – 2015-10-11 (×2): 10 mg via ORAL
  Filled 2015-10-10 (×2): qty 1

## 2015-10-10 MED ORDER — ATORVASTATIN CALCIUM 10 MG PO TABS
10.0000 mg | ORAL_TABLET | Freq: Every day | ORAL | Status: DC
  Administered 2015-10-10 – 2015-10-12 (×3): 10 mg via ORAL
  Filled 2015-10-10 (×3): qty 1

## 2015-10-10 MED ORDER — HYDROMORPHONE HCL 1 MG/ML IJ SOLN
1.0000 mg | INTRAMUSCULAR | Status: DC | PRN
  Administered 2015-10-10: 1 mg via INTRAVENOUS
  Filled 2015-10-10: qty 1

## 2015-10-10 MED ORDER — PIPERACILLIN-TAZOBACTAM 3.375 G IVPB 30 MIN
3.3750 g | Freq: Once | INTRAVENOUS | Status: DC
  Filled 2015-10-10: qty 50

## 2015-10-10 MED ORDER — VANCOMYCIN HCL IN DEXTROSE 1-5 GM/200ML-% IV SOLN
1000.0000 mg | Freq: Once | INTRAVENOUS | Status: AC
  Administered 2015-10-10: 1000 mg via INTRAVENOUS
  Filled 2015-10-10: qty 200

## 2015-10-10 MED ORDER — VANCOMYCIN HCL IN DEXTROSE 1-5 GM/200ML-% IV SOLN
1000.0000 mg | Freq: Once | INTRAVENOUS | Status: DC
  Filled 2015-10-10: qty 200

## 2015-10-10 MED ORDER — PIPERACILLIN-TAZOBACTAM 3.375 G IVPB
3.3750 g | Freq: Once | INTRAVENOUS | Status: AC
  Administered 2015-10-10: 3.375 g via INTRAVENOUS
  Filled 2015-10-10: qty 50

## 2015-10-10 MED ORDER — PIPERACILLIN-TAZOBACTAM 3.375 G IVPB
3.3750 g | Freq: Three times a day (TID) | INTRAVENOUS | Status: DC
  Administered 2015-10-10 – 2015-10-12 (×7): 3.375 g via INTRAVENOUS
  Filled 2015-10-10 (×7): qty 50

## 2015-10-10 NOTE — ED Notes (Addendum)
Per EMS-Pt was found on floor in room c/o n/v. Started at 2130-abd cramps and tenderness to the upper abdominal area . Vomited multiple times. Given 4 mg zofran IV and a  bolus en route.

## 2015-10-10 NOTE — Progress Notes (Signed)
ANTIBIOTIC CONSULT NOTE - INITIAL  Pharmacy Consult for Vancomycin, Zosyn Indication: rule out sepsis  Allergies  Allergen Reactions  . Sulfa Antibiotics     childhood    Patient Measurements: Height:  (177.8 cm) Weight: 170 lb (77.111 kg) IBW/kg (Calculated) : 73 Adjusted Body Weight:   Vital Signs: Temp: 98 F (36.7 C) (01/23 0900) Temp Source: Oral (01/23 0900) BP: 79/48 mmHg (01/23 0900) Pulse Rate: 100 (01/23 0900) Intake/Output from previous day:   Intake/Output from this shift:    Labs:  Recent Labs  10/10/15 0124  WBC 9.1  HGB 15.2  PLT 164  CREATININE 1.11   Estimated Creatinine Clearance: 71.2 mL/min (by C-G formula based on Cr of 1.11). No results for input(s): VANCOTROUGH, VANCOPEAK, VANCORANDOM, GENTTROUGH, GENTPEAK, GENTRANDOM, TOBRATROUGH, TOBRAPEAK, TOBRARND, AMIKACINPEAK, AMIKACINTROU, AMIKACIN in the last 72 hours.   Microbiology: No results found for this or any previous visit (from the past 720 hour(s)).  Medical History: Past Medical History  Diagnosis Date  . Depression      Assessment: 20 yoM presents with lower abdominal pain associated with N/V/D.  Found to be hypotensive which improved after fluid bolus, temperature 100.9, lactic acid 2.35.  Patient was started on vancomycin and zosyn for r/o sepsis.  First doses already given in ED.  Source likely UTI or duodenitis per admitting MD.  - CT abdomen:  mild wall thickening in the proximal duodenum suspicious for a degree of duodenitis but no ulceration or fistula, no bowel obstruction or abscess.  - Abdominal x-ray: no evidence of acute intra-abdominal or cardiopulmonary process.  - Urinalysis significant for trace leukocytes and few bacteria. - SCr 1.11, CrCl~69 ml/min (normalized), ~74 ml/min (CG)  Antimicrobials this admission: 1/23 Vancomyin >>  1/23 Zosyn >>   Levels/dose changes this admission: -  Microbiology Results: 1/23 BCx: sent 1/23 UCx: sent  Goal of  Therapy:  Vancomycin trough level 15-20 mcg/ml  Doses adjusted per renal function Eradication of infection  Plan:  1.  Vancomycin 1g IV q12h. 2.  Zosyn 3.375g IV q8h (4 hour infusion time).  3.  F/u SCr, culture results, clinical course.  Clance Boll 10/10/2015,11:02 AM

## 2015-10-10 NOTE — ED Notes (Signed)
Bed: BJ47 Expected date:  Expected time:  Means of arrival:  Comments: EMS 64yo M N/V abd pain

## 2015-10-10 NOTE — ED Notes (Signed)
Attempted to call report, RN unavailable.

## 2015-10-10 NOTE — ED Provider Notes (Signed)
  Physical Exam  BP 100/65 mmHg  Pulse 104  Temp(Src) 100.9 F (38.3 C) (Rectal)  Resp 14  Ht  (1.778 m)  Wt 170 lb (77.111 kg)  BMI 24.39 kg/m2  SpO2 98%  Physical Exam  ED Course  Procedures  MDM  Patient was in signout from Dr. Patria Mane. Had nausea vomiting diarrhea with hypotension. Also abdominal pain. Mildly elevated lactate. CT scan done showed duodenitis. Will admit to internal medicine.     Benjiman Core, MD 10/10/15 [phone]

## 2015-10-10 NOTE — Care Management Note (Signed)
Case Management Note  Patient Details  Name: Daryon Remmert MRN: [id number] Date of Birth: 03/15/1951  Subjective/Objective:            abd pain        Action/Plan:Date: October 10, 2015 Chart reviewed for concurrent status and case management needs. Will continue to follow patient for changes and needs: Marcelle Smiling, BSN, CCM, RN,   [phone]   Expected Discharge Date:   (unknown)               Expected Discharge Plan:  Home/Self Care  In-House Referral:  NA  Discharge planning Services  CM Consult  Post Acute Care Choice:  NA Choice offered to:  NA  DME Arranged:    DME Agency:     HH Arranged:    HH Agency:     Status of Service:  In process, will continue to follow  Medicare Important Message Given:    Date Medicare IM Given:    Medicare IM give by:    Date Additional Medicare IM Given:    Additional Medicare Important Message give by:     If discussed at Long Length of Stay Meetings, dates discussed:    Additional Comments:  Golda Acre, RN 10/10/2015, 12:27 PM

## 2015-10-10 NOTE — H&P (Signed)
[facility] History and Physical  Markey Deady PQZ:[id number] DOB: [birth year] DOA: 10/10/2015  Referring physician: ER physician: Dr. Davonna Belling  PCP: Jenny Reichmann, MD  Chief Complaint: abdominal pain   HPI: 65 year old male with past medical history of depression, dyslipidemia who presented to [facility] with worsening lower abdominal pain associated with nausea, vomiting and diarrhea started last night prior to this admission. Patient reports intermittent, sharp lower abdominal pain, 10 out of 10 in intensity, pain was worse with eating and patient reports not having good by mouth intake because of the pain. He also had about 6 episodes of vomiting at home. No reports of blood in the stool. No fevers or chills. No reports of chest pain or shortness of breath or palpitations. No lightheadedness or loss of consciousness  In ED, blood pressure as low as 60's/40'. After 2 L fluid challenge blood pressure was 100/65. Heart rate 115, respiratory rate 33, T max 100.9 F. blood work was relatively unremarkable other than lactic acid of 2.35 and repeat level within normal limits. CT abdomen on the admission demonstrated mild wall thickening in the proximal duodenum suspicious for a degree of duodenitis but no ulceration or fistula, no bowel obstruction or abscess. On abdominal x-ray there was no evidence of acute intra-abdominal or cardiopulmonary process. Urinalysis was significant for trace leukocytes and few bacteria. Sepsis criteria were met on the admission for which reason he was started on broad-spectrum antibiotics, vancomycin and Zosyn.   Assessment & Plan    Principal Problem: Sepsis / UTI - Sepsis criteria met on the admission with hypotension even after 2 L fluid challenge, tachycardia, tachypnea, fever, lactic acidosis - Source of infection likely UTI, also duodenitis is seen on CT abdomen - Sepsis workup initiated - Started vancomycin and Zosyn - Follow up blood  culture results, urine culture results, Pro calcitonin. Lactic acid 2.35 on admission and repeat level WNL - Monitor on telemetry   Active Problem: Abdominal Pain / Duodenitis - Abdominal pain likely related to duodenitis - Patient can be on clear liquid diet for now - Continue IV fluids, normal saline at 150 mL an hour - Start Protonix 40 mg IV every 12 hours - Continue pain management efforts and antiemetics as needed  Dyslipidemia - Continue atorvastatin  Depression - Continue Elavil and Paxil  DVT prophylaxis:  - SCD's bilaterally   Radiological Exams on Admission: Ct Abdomen Pelvis W Contrast 10/10/2015   Mild wall thickening in the proximal duodenum, suspicious for a degree of duodenitis. No ulceration or fistula but identified by CT in this area. Scattered sigmoid diverticula without diverticulitis. Appendix absent. No periappendiceal region inflammation. There is no bowel obstruction or abscess in the abdomen or pelvis. Multiple prostatic calculi. No renal or ureteral calculus. No hydronephrosis. Subcentimeter left adrenal adenoma, a finding felt to be benign. Rather minimal hiatal hernia. 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 Electronically Signed   By: Lowella Grip III M.D.   On: 10/10/2015 07:29   Dg Abd Acute W/chest 10/10/2015  No evidence of active pulmonary disease. Normal nonobstructive bowel gas pattern. Electronically Signed   By: Lucienne Capers M.D.   On: 10/10/2015 03:46    Code Status: Full Family Communication: Plan of care discussed with the patient  Disposition Plan: Admit for further evaluation, telemetry due to hypotension   Leisa Lenz, MD  [facility] Pager [phone]  Time spent in minutes: 75 minutes  Review of Systems:  Constitutional: Negative for fever, chills and malaise/fatigue. Negative for diaphoresis.  HENT: Negative for hearing loss, ear pain, nosebleeds, congestion, sore  throat, neck pain, tinnitus and ear discharge.   Eyes: Negative for blurred vision, double vision, photophobia, pain, discharge and redness.  Respiratory: Negative for cough, hemoptysis, sputum production, shortness of breath, wheezing and stridor.   Cardiovascular: Negative for chest pain, palpitations, orthopnea, claudication and leg swelling.  Gastrointestinal: per HPI Genitourinary: Negative for dysuria, urgency, frequency, hematuria and flank pain.  Musculoskeletal: Negative for myalgias, back pain, joint pain and falls.  Skin: Negative for itching and rash.  Neurological: Negative for dizziness and weakness. Negative for tingling, tremors, sensory change, speech change, focal weakness, loss of consciousness and headaches.  Endo/Heme/Allergies: Negative for environmental allergies and polydipsia. Does not bruise/bleed easily.  Psychiatric/Behavioral: Negative for suicidal ideas. The patient is not nervous/anxious.      Past Medical History  Diagnosis Date  . Kidney stones   . Depression   . Insomnia, unspecified   . Other and unspecified hyperlipidemia   . Elevated PSA    Past Surgical History  Procedure Laterality Date  . Appendectomy    . Vasectomy     Social History:  reports that he has never smoked. He does not have any smokeless tobacco history on file. He reports that he does not drink alcohol or use illicit drugs.  Allergies  Allergen Reactions  . Sulfa Antibiotics     childhood    Family History:  Family History  Problem Relation Age of Onset  . Coronary artery disease    . Cancer    . Stroke    . Arrhythmia    . Cancer Mother   . Cancer Father   . Heart disease Father   . Stroke Father   . Cancer Maternal Grandmother   . Stroke Maternal Grandfather      Prior to Admission medications   Medication Sig Start Date End Date Taking? Authorizing Provider  amitriptyline (ELAVIL) 25 MG tablet Take 1 tablet (25 mg total) by mouth at bedtime as needed for  sleep. TAKE ONE TABLET BY MOUTH AT BEDTIME.   "NEEDS OFFICE VISIT FOR ADDITIONAL REFILLS" 12/24/14  Yes Darlyne Russian, MD  aspirin 81 MG tablet Take 1 tablet (81 mg total) by mouth daily. 12/04/11  Yes Darlyne Russian, MD  atorvastatin (LIPITOR) 10 MG tablet Take 1 tablet (10 mg total) by mouth daily. 12/24/14  Yes Darlyne Russian, MD  PARoxetine (PAXIL) 30 MG tablet TAKE ONE (1) TABLET EACH DAY 12/24/14  Yes Darlyne Russian, MD  zolpidem (AMBIEN) 10 MG tablet take 1 tablet by mouth at bedtime 08/18/15  Yes Darlyne Russian, MD   Physical Exam: Filed Vitals:   10/10/15 4497 10/10/15 0555 10/10/15 0641 10/10/15 0726  BP: _0 100/65  Pulse:   115 104  Temp:      TempSrc:      Resp: _1 Height:      Weight:      SpO2: 95%  97% 98%    Physical Exam  Constitutional: Appears well-developed and well-nourished. No distress.  HENT: Normocephalic. No tonsillar erythema or exudates Eyes: Conjunctivae are normal. No scleral icterus.  Neck: Normal ROM. Neck supple. No JVD. No tracheal deviation. No thyromegaly.  CVS: RRR, S1/S2 appreciated  Pulmonary: Effort and breath sounds normal, no stridor, rhonchi, wheezes, rales.  Abdominal: Soft. BS +,  no distension, tenderness, rebound or guarding.  Musculoskeletal: Normal  range of motion. No edema and no tenderness.  Lymphadenopathy: No lymphadenopathy noted, cervical, inguinal. Neuro: Alert. Normal reflexes, muscle tone coordination. No focal neurologic deficits. Skin: Skin is warm and dry. No rash noted.  No erythema. No pallor.  Psychiatric: Normal mood and affect. Behavior, judgment, thought content normal.   Labs on Admission:  Basic Metabolic Panel:  Recent Labs Lab 10/10/15 0124  NA 144  K 3.9  CL 107  CO2 21*  GLUCOSE 144*  BUN 25*  CREATININE 1.11  CALCIUM 9.4   Liver Function Tests:  Recent Labs Lab 10/10/15 0124  AST 30  ALT 21  ALKPHOS 103  BILITOT 1.4*  PROT 6.9  ALBUMIN 4.2    Recent Labs Lab  10/10/15 0124  LIPASE 31   No results for input(s): AMMONIA in the last 168 hours. CBC:  Recent Labs Lab 10/10/15 0124  WBC 9.1  HGB 15.2  HCT 44.9  MCV 89.6  PLT 164   Cardiac Enzymes: No results for input(s): CKTOTAL, CKMB, CKMBINDEX, TROPONINI in the last 168 hours. BNP: Invalid input(s): POCBNP CBG: No results for input(s): GLUCAP in the last 168 hours.  If 7PM-7AM, please contact night-coverage [email or web address] Password [email or web address] 10/10/2015, 8:40 AM

## 2015-10-10 NOTE — ED Provider Notes (Signed)
CSN: [id number]     Arrival date & time 10/10/15  0105 History   First MD Initiated Contact with Patient 10/10/15 0119     Chief Complaint  Patient presents with  . Abdominal Pain     (Consider location/radiation/quality/duration/timing/severity/associated sxs/prior Treatment) HPI  Brian [name] is a 65 y.o. male who presents emergency room with nausea and vomiting that began at 10:30 tonight with associated abdominal pain located across his low abdomen. He states that he first had diarrhea at 10 PM. He states that he had no melena, no hematochezia. Approximately half an hour later he began vomiting and estimates that he vomited 6 times. Emesis described as nonbloody nonbilious.  He states is profoundly weak with tingling in his fingers and toes. He stated that the pain and nausea was so bad he just had to lay on the floor and could not get up.  He was brought to the ER via EMS and received IV fluids and Zofran en route, with multiple episodes of vomiting.  He currently states he has 5 out of 10 abdominal pain located across his low abdomen without radiation. He endorses small amount of nausea.  He reports appendectomy in the 1980s. He denies NSAID use, does not drink alcohol. His wife was sick a few days ago with similar symptoms. He denies any diagnosis of diverticulitis, pancreatitis.  He denies CP, SOB, HA, fever, chills, sweats, cough, urinary sx.  Past Medical History  Diagnosis Date  . Kidney stones   . Depression   . Insomnia, unspecified   . Other and unspecified hyperlipidemia   . Elevated PSA    Past Surgical History  Procedure Laterality Date  . Appendectomy    . Vasectomy     Family History  Problem Relation Age of Onset  . Coronary artery disease    . Cancer    . Stroke    . Arrhythmia    . Cancer Mother   . Cancer Father   . Heart disease Father   . Stroke Father   . Cancer Maternal Grandmother   . Stroke Maternal Grandfather    Social History  Substance Use  Topics  . Smoking status: Never Smoker   . Smokeless tobacco: None  . Alcohol Use: No    Review of Systems  All other systems reviewed and are negative.     Allergies  Sulfa antibiotics  Home Medications   Prior to Admission medications   Medication Sig Start Date End Date Taking? Authorizing Provider  amitriptyline (ELAVIL) 25 MG tablet Take 1 tablet (25 mg total) by mouth at bedtime as needed for sleep. TAKE ONE TABLET BY MOUTH AT BEDTIME.   "NEEDS OFFICE VISIT FOR ADDITIONAL REFILLS" 12/24/14  Yes Collene Gobble, MD  aspirin 81 MG tablet Take 1 tablet (81 mg total) by mouth daily. 12/04/11  Yes Collene Gobble, MD  atorvastatin (LIPITOR) 10 MG tablet Take 1 tablet (10 mg total) by mouth daily. 12/24/14  Yes Collene Gobble, MD  butalbital-acetaminophen-caffeine (FIORICET, ESGIC) [phone] MG tablet take 1 tablet by mouth every 6 hours if needed for headache 09/15/15  Yes Collene Gobble, MD  PARoxetine (PAXIL) 30 MG tablet TAKE ONE (1) TABLET EACH DAY 12/24/14  Yes Collene Gobble, MD  zolpidem (AMBIEN) 10 MG tablet take 1 tablet by mouth at bedtime 08/18/15  Yes Collene Gobble, MD   BP 75/56 mmHg  Pulse 108  Temp(Src) 100.9 F (38.3 C) (Rectal)  Resp 16  Ht 5'  10" (1.778 m)  Wt 77.111 kg  BMI 24.39 kg/m2  SpO2 95% Physical Exam  Constitutional: He is oriented to person, place, and time. He appears well-developed and well-nourished. He appears distressed.  HENT:  Head: Normocephalic and atraumatic.  Nose: Nose normal.  Mouth/Throat: Oropharynx is clear and moist. No oropharyngeal exudate.  Oral mucosa dry, no pallor, no cyanosis  Eyes: Conjunctivae and EOM are normal. Pupils are equal, round, and reactive to light. Right eye exhibits no discharge. Left eye exhibits no discharge. No scleral icterus.  Neck: Normal range of motion. Neck supple. No JVD present. No tracheal deviation present. No thyromegaly present.  Cardiovascular: Normal rate, regular rhythm, normal heart sounds and intact  distal pulses.  Exam reveals no gallop and no friction rub.   No murmur heard. Pulses:      Radial pulses are 2+ on the right side, and 2+ on the left side.       Dorsalis pedis pulses are 2+ on the right side, and 2+ on the left side.  Brisk capillary refill  Pulmonary/Chest: Effort normal and breath sounds normal. No accessory muscle usage. Tachypnea noted. No respiratory distress. He has no decreased breath sounds. He has no wheezes. He has no rhonchi. He has no rales. He exhibits no tenderness.  Abdominal: Soft. Bowel sounds are normal. He exhibits no distension and no mass. There is tenderness. There is no rebound and no guarding.  Generalized abdominal pain, no guarding, no rebound tenderness, no CVA tenderness  Genitourinary: Guaiac negative stool.  Musculoskeletal: Normal range of motion. He exhibits no edema or tenderness.  Lymphadenopathy:    He has no cervical adenopathy.  Neurological: He is alert and oriented to person, place, and time. He has normal strength. No cranial nerve deficit or sensory deficit. He exhibits normal muscle tone. Coordination normal. GCS eye subscore is 4. GCS verbal subscore is 5. GCS motor subscore is 6.  Skin: Skin is warm and dry. No rash noted. He is not diaphoretic. No cyanosis or erythema. No pallor. Nails show no clubbing.  Psychiatric: His speech is normal and behavior is normal. Judgment and thought content normal. His mood appears anxious. Cognition and memory are normal.  Nursing note and vitals reviewed.   ED Course  Procedures (including critical care time) Labs Review Labs Reviewed  COMPREHENSIVE METABOLIC PANEL - Abnormal; Notable for the following:    CO2 21 (*)    Glucose, Bld 144 (*)    BUN 25 (*)    Total Bilirubin 1.4 (*)    Anion gap 16 (*)    All other components within normal limits  URINALYSIS, ROUTINE W REFLEX MICROSCOPIC (NOT AT [facility]) - Abnormal; Notable for the following:    Color, Urine AMBER (*)    APPearance CLOUDY (*)     pH 8.5 (*)    Bilirubin Urine SMALL (*)    Ketones, ur 40 (*)    Leukocytes, UA TRACE (*)    All other components within normal limits  URINE MICROSCOPIC-ADD ON - Abnormal; Notable for the following:    Squamous Epithelial / LPF 0-5 (*)    Bacteria, UA RARE (*)    Casts HYALINE CASTS (*)    All other components within normal limits  I-STAT CG4 LACTIC ACID, ED - Abnormal; Notable for the following:    Lactic Acid, Venous 2.35 (*)    All other components within normal limits  CULTURE, BLOOD (ROUTINE X 2)  CULTURE, BLOOD (ROUTINE X 2)  URINE CULTURE  LIPASE, BLOOD  CBC  POC OCCULT BLOOD, ED    Imaging Review Dg Abd Acute W/chest  10/10/2015  CLINICAL DATA:  Nausea, vomiting, diarrhea, and abdominal pain. EXAM: DG ABDOMEN ACUTE W/ 1V CHEST COMPARISON:  None. FINDINGS: Normal heart size and pulmonary vascularity. No focal airspace disease or consolidation in the lungs. No blunting of costophrenic angles. No pneumothorax. Mediastinal contours appear intact. Scattered gas and stool in the colon. No small or large bowel distention. No free intra-abdominal air. No abnormal air-fluid levels. No radiopaque stones. Visualized bones appear intact. Calcified phleboliths in the pelvis. IMPRESSION: No evidence of active pulmonary disease. Normal nonobstructive bowel gas pattern. Electronically Signed   By: Burman Nieves M.D.   On: 10/10/2015 03:46   I have personally reviewed and evaluated these images and lab results as part of my medical decision-making.   EKG Interpretation None      MDM   65 year old male with nausea, diarrhea, vomiting and abdominal pain that began this evening at approximately 10:30 PM, patient presented with normal vital signs but appeared distressed and was hyperventilating and shaking.  His wife was sick a few days ago with similar symptoms  Physical pain improved with fluids, Zofran, morphine and Phenergan. He had no further vomiting in the ER and his abdominal  pain decreased significantly. Patient was reevaluated and he appeared comfortable. No abdominal pain with reexamination.  Patient's initial lab work was significant for urinalysis consistent with dehydration, decreased CO2 consistent with his hyperventilation, mildly elevated BUN and anion gap of 16, may be secondary to hyperventilation with anxiety.  Despite his improvement in clinical appearance, he had soft blood pressure, SBP ranging (89-93) and persistent tachycardia (~115) after 2.5 L normal saline.  The pt was presented to Dr. Patria Mane, who has seen and evaluated the pt.  He advised ambulating pt and reevaluation of vitals.  Pt was lightheaded and SBP 80's.  Lactic acid, BC, 1 L IVF, urine culture, CT abd/pelvis added.  Dr. Patria Mane assumed primary care of pt at shift change.  Lactic acid 2.35, abx added.  Final diagnoses:  None       Danelle Berry, PA-C 10/10/15 1610  Azalia Bilis, MD 10/10/15 [phone]

## 2015-10-11 ENCOUNTER — Encounter (HOSPITAL_COMMUNITY): Payer: Self-pay | Admitting: Cardiology

## 2015-10-11 DIAGNOSIS — I480 Paroxysmal atrial fibrillation: Secondary | ICD-10-CM | POA: Clinically undetermined

## 2015-10-11 DIAGNOSIS — R6520 Severe sepsis without septic shock: Secondary | ICD-10-CM

## 2015-10-11 DIAGNOSIS — R112 Nausea with vomiting, unspecified: Secondary | ICD-10-CM

## 2015-10-11 DIAGNOSIS — I4891 Unspecified atrial fibrillation: Secondary | ICD-10-CM

## 2015-10-11 DIAGNOSIS — R197 Diarrhea, unspecified: Secondary | ICD-10-CM

## 2015-10-11 LAB — CBC
HCT: 35.4 % — ABNORMAL LOW (ref 39.0–52.0)
Hemoglobin: 11.6 g/dL — ABNORMAL LOW (ref 13.0–17.0)
MCH: 30.1 pg (ref 26.0–34.0)
MCHC: 32.8 g/dL (ref 30.0–36.0)
MCV: 91.9 fL (ref 78.0–100.0)
PLATELETS: 119 10*3/uL — AB (ref 150–400)
RBC: 3.85 MIL/uL — AB (ref 4.22–5.81)
RDW: 13.9 % (ref 11.5–15.5)
WBC: 5.5 10*3/uL (ref 4.0–10.5)

## 2015-10-11 LAB — URINE CULTURE: Culture: NO GROWTH

## 2015-10-11 LAB — COMPREHENSIVE METABOLIC PANEL
ALT: 17 U/L (ref 17–63)
AST: 23 U/L (ref 15–41)
Albumin: 3.1 g/dL — ABNORMAL LOW (ref 3.5–5.0)
Alkaline Phosphatase: 60 U/L (ref 38–126)
Anion gap: 7 (ref 5–15)
BUN: 14 mg/dL (ref 6–20)
CHLORIDE: 110 mmol/L (ref 101–111)
CO2: 24 mmol/L (ref 22–32)
CREATININE: 1.1 mg/dL (ref 0.61–1.24)
Calcium: 8.1 mg/dL — ABNORMAL LOW (ref 8.9–10.3)
GFR calc Af Amer: >60 mL/min (ref >60)
GFR calc non Af Amer: >60 mL/min (ref >60)
GLUCOSE: 107 mg/dL — AB (ref 65–99)
Potassium: 3.4 mmol/L — ABNORMAL LOW (ref 3.5–5.1)
SODIUM: 141 mmol/L (ref 135–145)
Total Bilirubin: 0.6 mg/dL (ref 0.3–1.2)
Total Protein: 5.5 g/dL — ABNORMAL LOW (ref 6.5–8.1)

## 2015-10-11 MED ORDER — ASPIRIN 325 MG PO TABS
325.0000 mg | ORAL_TABLET | Freq: Every day | ORAL | Status: DC
  Administered 2015-10-11 – 2015-10-12 (×2): 325 mg via ORAL
  Filled 2015-10-11 (×3): qty 1

## 2015-10-11 MED ORDER — POTASSIUM CHLORIDE CRYS ER 20 MEQ PO TBCR
20.0000 meq | EXTENDED_RELEASE_TABLET | Freq: Once | ORAL | Status: AC
  Administered 2015-10-11: 20 meq via ORAL
  Filled 2015-10-11: qty 1

## 2015-10-11 NOTE — Consult Note (Signed)
CARDIOLOGY CONSULTATION NOTE.  NAME:  Brian [name]   MRN: [id number] DOB:  December 06, 1950   ADMIT DATE: 10/10/2015  Reason for Consult: New onset atrial fibrillation [facility])  Requesting Physician: Dr. Cena Benton [facility]) Primary Care Provider: Lucilla Edin, MD Primary Cardiologist: new (had GXT by Dr. Nadara Eaton - but not established)  HPI: This is a 65 y.o. male with a past medical history significant for depression/dysthymia who was in his usual state of health until this Sunday (January 22) when he started having some abdominal cramping is noted that his wife viral gastroenteritis over weekend as well. The symptoms progressed with worsening lower abdominal pain and nausea as well as vomiting and diarrhea. He presented to the [facility] emergency room on the morning of January 23 (Monday). At time he denied any fevers or chills. No chest tightness or pressure. No rapid irregular heartbeats palpitations.   emergency room was noted to be essentially in septic shock with blood pressures in the 60s systolic. He was given fluid bolus and pressures improved somewhat to the low 100s. His also tachycardic and febrile. He was admitted with concern of duodenitis resulting in sepsis. Upon arrival to the floor, telemetry was reviewed and there is concern that he may be in atrial fibrillation. He did have some heart rates as high as 140s, but currently now in the 90s. He is unaware of any rapid or irregular heartbeats. He is never had any symptoms of rapid irregular heartbeats.  All my evaluation today, he is actually feeling notably improved. The abdominal discomfort is has also improved. He is to have a bowel movement. He is not having any symptoms of shortness of breath or dyspnea. No sensation of rapid irregular heartbeat.  From a cardiac standpoint, he is not noted any prior episodes of rapid irregular heartbeat. He is relatively symptomatic now. He is not had any heart failure symptoms of PND, orthopnea or edema. No  exertional dyspnea or chest pain. Cardiovascular ROS: no chest pain or dyspnea on exertion positive for - rapid heart rate and Associated with nausea, fever and abdominal pain negative for - dyspnea on exertion, edema, irregular heartbeat, loss of consciousness, murmur, orthopnea, palpitations, paroxysmal nocturnal dyspnea, shortness of breath or Near-syncope/ syncope, TIA/amaurosis fugax. He also denied any melena, hematochezia or hematuria.  Past Medical History  Diagnosis Date  . Depression    Past Surgical History  Procedure Laterality Date  . Appendectomy    . Vasectomy    . Cardiovascular stress test  01/22/2014    GXT - Ex 10 min (10.9 METs), HR 164 = 103% MPHR. No ST-T wave chagnes or Sx.  LOW RISK  --[facility] TM Score 10     FAMHx: Family History  Problem Relation Age of Onset  . Coronary artery disease    . Cancer    . Stroke    . Arrhythmia    . Cancer Mother   . Cancer Father   . Heart disease Father   . Stroke Father   . Cancer Maternal Grandmother   . Stroke Maternal Grandfather     SOCHx:  reports that he has never smoked. He has never used smokeless tobacco. He reports that he does not drink alcohol or use illicit drugs.  ALLERGIES: Allergies  Allergen Reactions  . Sulfa Antibiotics     childhood    ROS: a comprehensive review systems was performed  Review of Systems  Constitutional: Positive for malaise/fatigue.       No subjective fevers or  chills, but did have low-grade fever and emergency room  HENT: Negative for congestion, ear discharge and nosebleeds.   Eyes: Negative for blurred vision and double vision.  Respiratory: Negative for cough and shortness of breath.   Cardiovascular:       Per history of present illness  Gastrointestinal: Positive for nausea, vomiting, abdominal pain and diarrhea. Negative for blood in stool and melena.  Musculoskeletal: Negative for myalgias, joint pain and falls.  Neurological: Positive for dizziness (Upon arrival  to the ER.) and weakness (Generalized). Negative for headaches.  Endo/Heme/Allergies: Does not bruise/bleed easily.  Psychiatric/Behavioral: Negative.   All other systems reviewed and are negative.  HOME MEDICATIONS: Prescriptions prior to admission  Medication Sig Dispense Refill Last Dose  . amitriptyline (ELAVIL) 25 MG tablet Take 1 tablet (25 mg total) by mouth at bedtime as needed for sleep. TAKE ONE TABLET BY MOUTH AT BEDTIME.   "NEEDS OFFICE VISIT FOR ADDITIONAL REFILLS" 30 tablet 11 10/08/2015 at Unknown time  . aspirin 81 MG tablet Take 1 tablet (81 mg total) by mouth daily. 30 tablet 11 10/09/2015 at Unknown time  . atorvastatin (LIPITOR) 10 MG tablet Take 1 tablet (10 mg total) by mouth daily. 30 tablet 11 10/09/2015 at Unknown time  . butalbital-acetaminophen-caffeine (FIORICET, ESGIC) 50-325-40 MG tablet take 1 tablet by mouth every 6 hours if needed for headache 30 tablet 4 Past Week at Unknown time  . PARoxetine (PAXIL) 30 MG tablet TAKE ONE (1) TABLET EACH DAY 30 tablet 11 10/09/2015 at Unknown time  . zolpidem (AMBIEN) 10 MG tablet take 1 tablet by mouth at bedtime 30 tablet 5 Past Week at Unknown time    HOSPITAL MEDICATIONS: Scheduled Meds: . aspirin  325 mg Oral Daily  . atorvastatin  10 mg Oral Daily  . pantoprazole (PROTONIX) IV  40 mg Intravenous Q12H  . PARoxetine  30 mg Oral Daily  . piperacillin-tazobactam (ZOSYN)  IV  3.375 g Intravenous Q8H  . sodium chloride  3 mL Intravenous Q12H  . vancomycin  1,000 mg Intravenous Q12H  . zolpidem  10 mg Oral QHS   Continuous Infusions: . sodium chloride 150 mL/hr at 10/10/15 1633   PRN Meds:.acetaminophen **OR** acetaminophen, amitriptyline, butalbital-acetaminophen-caffeine, HYDROmorphone (DILAUDID) injection, promethazine VITALS: Blood pressure 106/72, pulse 99, temperature 99.3 F (37.4 C), temperature source Axillary, resp. rate 20, height  (1.778 m), weight 170 lb (77.111 kg), SpO2 96 %.  PHYSICAL  EXAM: Physical Exam  Constitutional: He is oriented to person, place, and time. He appears well-developed and well-nourished. No distress.  HENT:  Head: Normocephalic and atraumatic.  Nose: Nose normal.  Mouth/Throat: Oropharynx is clear and moist. No oropharyngeal exudate.  Eyes: Conjunctivae and EOM are normal. Pupils are equal, round, and reactive to light. No scleral icterus.  Neck: Normal range of motion. Neck supple. No JVD present. No tracheal deviation present. No thyromegaly present.  Cardiovascular: Normal rate, normal heart sounds and intact distal pulses.  An irregularly irregular rhythm present. PMI is not displaced.  Exam reveals no gallop, no S4, no friction rub, no midsystolic click and no opening snap.   Pulmonary/Chest: No stridor. No respiratory distress. He has no wheezes. He has no rales. He exhibits no tenderness.  Abdominal: Soft. He exhibits distension (Borderline distended, firm). He exhibits no mass. There is tenderness. There is rebound. There is no guarding.  Genitourinary:  Deferred  Musculoskeletal: Normal range of motion. He exhibits no edema or tenderness.  Lymphadenopathy:    He has no cervical  adenopathy.  Neurological: He is alert and oriented to person, place, and time. He has normal reflexes. No cranial nerve deficit.  Skin: Skin is warm and dry. No rash noted. He is not diaphoretic. No erythema. No pallor.  Psychiatric: He has a normal mood and affect. His behavior is normal. Judgment and thought content normal.    LABS: Results for orders placed or performed during the hospital encounter of 10/10/15 (from the past 24 hour(s))  Lactic acid, plasma     Status: None   Collection Time: 10/10/15  2:15 PM  Result Value Ref Range   Lactic Acid, Venous 0.7 0.5 - 2.0 mmol/L  Comprehensive metabolic panel     Status: Abnormal   Collection Time: 10/11/15  6:05 AM  Result Value Ref Range   Sodium 141 135 - 145 mmol/L   Potassium 3.4 (L) 3.5 - 5.1 mmol/L    Chloride 110 101 - 111 mmol/L   CO2 24 22 - 32 mmol/L   Glucose, Bld 107 (H) 65 - 99 mg/dL   BUN 14 6 - 20 mg/dL   Creatinine, Ser 8.65 0.61 - 1.24 mg/dL   Calcium 8.1 (L) 8.9 - 10.3 mg/dL   Total Protein 5.5 (L) 6.5 - 8.1 g/dL   Albumin 3.1 (L) 3.5 - 5.0 g/dL   AST 23 15 - 41 U/L   ALT 17 17 - 63 U/L   Alkaline Phosphatase 60 38 - 126 U/L   Total Bilirubin 0.6 0.3 - 1.2 mg/dL   GFR calc non Af Amer >60 >60 mL/min   GFR calc Af Amer >60 >60 mL/min   Anion gap 7 5 - 15  CBC     Status: Abnormal   Collection Time: 10/11/15  6:05 AM  Result Value Ref Range   WBC 5.5 4.0 - 10.5 K/uL   RBC 3.85 (L) 4.22 - 5.81 MIL/uL   Hemoglobin 11.6 (L) 13.0 - 17.0 g/dL   HCT 78.4 (L) 69.6 - 29.5 %   MCV 91.9 78.0 - 100.0 fL   MCH 30.1 26.0 - 34.0 pg   MCHC 32.8 30.0 - 36.0 g/dL   RDW 28.4 13.2 - 44.0 %   Platelets 119 (L) 150 - 400 K/uL    IMAGING: No results found.  IMPRESSION: Principal Problem:   Sepsis secondary to UTI [facility]) Active Problems:   Duodenitis   New onset atrial fibrillation (HCC)   Dyslipidemia   Depression   Lower abdominal pain  The patient has incidental finding of atrial fibrillation with borderline control rate in the setting of ongoing/resolving sepsis from abdominal infection. He has systemic inflammatory response driving his rapid rate which would be higher with A. fib and it would be in sinus rhythm. Now his rate is better controlled as his symptoms under control.   He is asymptomatic. Has not had any untoward symptoms in the past. He had a negative Myoview 2 years ago. He was able tolerate hypotension and tachycardia without any anginal symptoms or heart failure suggesting that he does not have significant CAD.  Currently this would be considered lone atrial fibrillation that has been triggered. With borderline blood pressures, we'll have any room to use AV nodal agents to treat (could consider digoxin for rate control if necessary). However with his borderline  pressures if he were to go into rapid rate with ongoing infection, I would recommend use of amiodarone.  This patients CHA2DS2-VASc Score and unadjusted Ischemic Stroke Rate (% per year) is equal to 0.2 % stroke  rate/year from a score of 0 -- would not anticoagulate now. Especially in the setting of active abdominal infection and bleeding. (Above score calculated as 1 point each if present [CHF, HTN, DM, Vascular=MI/PAD/Aortic Plaque, Age if 65-74, or Male] - 0 Above score calculated as 2 points each if present [Age > 75, or Stroke/TIA/TE] - 0)  For now plan will be to monitor his rate unless it goes fast but not treat otherwise.  If he does have symptomatically rapid rate with hypotension, would use amiodarone IV.  For now, provided he is a symptomatically relatively well controlled rate, my recommendation would be to have him wear an outpatient monitor for 1 month to determine his A. fib burden seen in clinic for follow-up to determine course of action.  Don't think he needs a stress test, as he is essentially passed the stress test of hypotension with tachycardia.   Thank you for this consult. We will reassess the patient tomorrow.   Time Spent Directly with Patient: 40 minutes  Viridiana Spaid, Piedad Climes, M.D., M.S. Interventional Cardiologist   Pager # [phone] Phone # [phone] [address] [place], [place] [address]

## 2015-10-11 NOTE — Progress Notes (Signed)
[facility] PROGRESS NOTE  Brian [name] ZOX:[id number] DOB: [birth year] DOA: 10/10/2015 PCP: Lucilla Edin, MD   Assessment/Plan: Principal Problem:   Sepsis  - Unclear if whether from urinary versus GI source. Most likely GI source is urine culture negative. Continue broad-spectrum IV antibiotics for now. Consider narrowing if improvement in condition next a.m.  Active Problems:   Depression - Stable patient on Paxil    Duodenitis - continue broad spectrum antibiotics for now. - Will consider narrowing to metronidazole and Cipro next a.m.    Lower abdominal pain - Most likely due to duodenitis  Hypokalemia - Replace orally today    Dyslipidemia - stable     New onset atrial fibrillation (HCC) - aspirin - Cardiology consulted and recommended further monitoring. If patient has elevated HR and soft blood pressures plan is to start IV amiodarone    Nausea vomiting and diarrhea - resolved  Code Status: full Family Communication:  D/c patient directly Disposition Plan: Pending improvement in condition   Consultants:  cardiology  Procedures:  none  Antibiotics:   Vancomycin  Zosyn  HPI/Subjective: Pt has no new complaints and reports feeling better  Objective: Filed Vitals:   10/11/15 1202 10/11/15 1457  BP: 106/72 106/72  Pulse: 99 105  Temp: 99.3 F (37.4 C) 98.1 F (36.7 C)  Resp: 20 20    Intake/Output Summary (Last 24 hours) at 10/11/15 1528 Last data filed at 10/11/15 0513  Gross per 24 hour  Intake    240 ml  Output   1050 ml  Net   -810 ml   Filed Weights   10/10/15 0110  Weight: 77.111 kg (170 lb)    Exam:   General:  Patient in no acute distress, alert and awake  Cardiovascular: S1 and S2 within normal limits, no rubs  Respiratory: No increased work of breathing, no wheezes, equal chest rise  Abdomen: Soft, nondistended, nontender  Musculoskeletal: No cyanosis or clubbing   Data Reviewed: Basic Metabolic  Panel:  Recent Labs Lab 10/10/15 0124 10/11/15 0605  NA 144 141  K 3.9 3.4*  CL 107 110  CO2 21* 24  GLUCOSE 144* 107*  BUN 25* 14  CREATININE 1.11 1.10  CALCIUM 9.4 8.1*   Liver Function Tests:  Recent Labs Lab 10/10/15 0124 10/11/15 0605  AST 30 23  ALT 21 17  ALKPHOS 103 60  BILITOT 1.4* 0.6  PROT 6.9 5.5*  ALBUMIN 4.2 3.1*    Recent Labs Lab 10/10/15 0124  LIPASE 31   No results for input(s): AMMONIA in the last 168 hours. CBC:  Recent Labs Lab 10/10/15 0124 10/11/15 0605  WBC 9.1 5.5  HGB 15.2 11.6*  HCT 44.9 35.4*  MCV 89.6 91.9  PLT 164 119*   Cardiac Enzymes: No results for input(s): CKTOTAL, CKMB, CKMBINDEX, TROPONINI in the last 168 hours. BNP (last 3 results) No results for input(s): BNP in the last 8760 hours.  ProBNP (last 3 results) No results for input(s): PROBNP in the last 8760 hours.  CBG: No results for input(s): GLUCAP in the last 168 hours.  Recent Results (from the past 240 hour(s))  Urine culture     Status: None   Collection Time: 10/10/15  1:52 AM  Result Value Ref Range Status   Specimen Description URINE, CLEAN CATCH  Final   Special Requests NONE  Final   Culture   Final    NO GROWTH 1 DAY Performed at [facility]    Report Status 10/11/2015  FINAL  Final  Blood culture (routine x 2)     Status: None (Preliminary result)   Collection Time: 10/10/15  5:21 AM  Result Value Ref Range Status   Specimen Description BLOOD RIGHT HAND  Final   Special Requests BOTTLES DRAWN AEROBIC AND ANAEROBIC  Final   Culture   Final    NO GROWTH 1 DAY Performed at [facility]    Report Status PENDING  Incomplete  Blood culture (routine x 2)     Status: None (Preliminary result)   Collection Time: 10/10/15  5:27 AM  Result Value Ref Range Status   Specimen Description BLOOD RIGHT ANTECUBITAL  Final   Special Requests BOTTLES DRAWN AEROBIC AND ANAEROBIC  Final   Culture   Final    NO GROWTH 1  DAY Performed at [facility]    Report Status PENDING  Incomplete     Studies: Ct Abdomen Pelvis W Contrast  10/10/2015  CLINICAL DATA:  Abdominal pain with nausea and vomiting for 1 day EXAM: CT ABDOMEN AND PELVIS WITH CONTRAST TECHNIQUE: Multidetector CT imaging of the abdomen and pelvis was performed using the standard protocol following bolus administration of intravenous contrast. Oral contrast was also administered. CONTRAST:  OMNIPAQUE IOHEXOL 300 MG/ML SOLN, 25mL OMNIPAQUE IOHEXOL 300 MG/ML SOLN COMPARISON:  None. FINDINGS: Lower chest: Lung bases are clear. There is a rather minimal hiatal hernia. Hepatobiliary: No focal liver lesions are identified. There is no appreciable gallbladder wall thickening. There is no biliary duct dilatation. Pancreas: There is no pancreatic mass or inflammatory focus. Spleen: No splenic lesions are identified. Adrenals/Urinary Tract: Right adrenal appears normal. There is a 7 x 6 mm left adrenal adenoma. There is mild scarring in the posterior mid right kidney region. There is no renal mass or hydronephrosis on either side. There is no renal or ureteral calculus on either side. Urinary bladder is midline with wall thickness within normal limits. Stomach/Bowel: There are scattered sigmoid diverticula without diverticulitis. There is no small or large bowel wall thickening. No mesenteric thickening. There is mild fold thickening in the proximal duodenum without fistula or apparent focal ulceration. No bowel obstruction. No free air or portal venous air. Vascular/Lymphatic: There is no abdominal aortic aneurysm. There is rather minimal calcification in the aorta and common iliac arteries. The major mesenteric vessels appear patent. There is no demonstrable adenopathy in the abdomen or pelvis. Reproductive: There are multiple prostatic calculi. Prostate and seminal vesicles appear normal in size and contour. There is no pelvic mass or pelvic fluid collection.  Other: Appendix is absent. There is no periappendiceal region inflammation. No abscess or ascites is apparent in the abdomen or pelvis. Musculoskeletal: There is degenerative change in the lumbar spine. There is minimal anterolisthesis of L5 on S1, felt to be due to underlying spondylosis. There are small pars defects at L5 bilaterally. There is no intramuscular or abdominal wall lesions. IMPRESSION: Mild wall thickening in the proximal duodenum, suspicious for a degree of duodenitis. No ulceration or fistula but identified by CT in this area. Scattered sigmoid diverticula without diverticulitis. Appendix absent. No periappendiceal region inflammation. There is no bowel obstruction or abscess in the abdomen or pelvis. Multiple prostatic calculi. No renal or ureteral calculus. No hydronephrosis. Subcentimeter left adrenal adenoma, a finding felt to be benign. Rather minimal hiatal hernia. 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 Electronically Signed   By: Bretta Bang III M.D.  On: 10/10/2015 07:29   Dg Abd Acute W/chest  10/10/2015  CLINICAL DATA:  Nausea, vomiting, diarrhea, and abdominal pain. EXAM: DG ABDOMEN ACUTE W/ 1V CHEST COMPARISON:  None. FINDINGS: Normal heart size and pulmonary vascularity. No focal airspace disease or consolidation in the lungs. No blunting of costophrenic angles. No pneumothorax. Mediastinal contours appear intact. Scattered gas and stool in the colon. No small or large bowel distention. No free intra-abdominal air. No abnormal air-fluid levels. No radiopaque stones. Visualized bones appear intact. Calcified phleboliths in the pelvis. IMPRESSION: No evidence of active pulmonary disease. Normal nonobstructive bowel gas pattern. Electronically Signed   By: Burman Nieves M.D.   On: 10/10/2015 03:46    Scheduled Meds: . aspirin  325 mg Oral Daily  . atorvastatin  10 mg Oral Daily  . pantoprazole (PROTONIX) IV  40 mg  Intravenous Q12H  . PARoxetine  30 mg Oral Daily  . piperacillin-tazobactam (ZOSYN)  IV  3.375 g Intravenous Q8H  . sodium chloride  3 mL Intravenous Q12H  . vancomycin  1,000 mg Intravenous Q12H  . zolpidem  10 mg Oral QHS   Continuous Infusions: . sodium chloride 150 mL/hr at 10/10/15 1633   Time spent:  > 35 minutes   Penny Pia  [facility] Pager [phone]. If 7PM-7AM, please contact night-coverage at [email or web address], password [facility] 10/11/2015, 3:28 PM  LOS: 1 day

## 2015-10-11 NOTE — Progress Notes (Signed)
Received page that patient is in atrial fibrillation according to telemetry monitoring. I have requested a new set of vitals and a 12 lead ekg. Patient reportedly is sitting up eating in no acute distress.  Consulted cardiology for new onset of afib.  Will await 12 lead ekg and vitals before making further recommendations. If patient with soft blood pressures may have to consider amiodarone. Low chads will place on aspirin  Brian [name]  Full progress note to follow

## 2015-10-12 LAB — BASIC METABOLIC PANEL
Anion gap: 6 (ref 5–15)
BUN: 11 mg/dL (ref 6–20)
CHLORIDE: 109 mmol/L (ref 101–111)
CO2: 26 mmol/L (ref 22–32)
CREATININE: 1.17 mg/dL (ref 0.61–1.24)
Calcium: 8.5 mg/dL — ABNORMAL LOW (ref 8.9–10.3)
GFR calc Af Amer: >60 mL/min (ref >60)
GFR calc non Af Amer: >60 mL/min (ref >60)
GLUCOSE: 101 mg/dL — AB (ref 65–99)
POTASSIUM: 3.5 mmol/L (ref 3.5–5.1)
Sodium: 141 mmol/L (ref 135–145)

## 2015-10-12 LAB — GLUCOSE, CAPILLARY: Glucose-Capillary: 102 mg/dL — ABNORMAL HIGH (ref 65–99)

## 2015-10-12 MED ORDER — ASPIRIN 325 MG PO TABS: 325.0000 mg | ORAL_TABLET | Freq: Every day | ORAL | Status: DC

## 2015-10-12 MED ORDER — METRONIDAZOLE 500 MG PO TABS
500.0000 mg | ORAL_TABLET | Freq: Three times a day (TID) | ORAL | Status: DC
Start: 2015-10-12 — End: 2015-10-25

## 2015-10-12 MED ORDER — CIPROFLOXACIN HCL 500 MG PO TABS
500.0000 mg | ORAL_TABLET | Freq: Two times a day (BID) | ORAL | Status: DC
Start: 2015-10-12 — End: 2015-10-25

## 2015-10-12 MED ORDER — CIPROFLOXACIN HCL 500 MG PO TABS
500.0000 mg | ORAL_TABLET | Freq: Two times a day (BID) | ORAL | Status: DC
  Filled 2015-10-12 (×2): qty 1

## 2015-10-12 MED ORDER — PANTOPRAZOLE SODIUM 20 MG PO TBEC: 20.0000 mg | DELAYED_RELEASE_TABLET | Freq: Every day | ORAL | Status: DC

## 2015-10-12 MED ORDER — METRONIDAZOLE 500 MG PO TABS
500.0000 mg | ORAL_TABLET | Freq: Three times a day (TID) | ORAL | Status: DC
  Administered 2015-10-12: 500 mg via ORAL
  Filled 2015-10-12 (×3): qty 1

## 2015-10-12 NOTE — Discharge Summary (Signed)
Physician Discharge Summary  Brian [name] ZOX:[id number] DOB: [birth year] DOA: 10/10/2015  PCP: Lucilla Edin, MD  Admit date: 10/10/2015 Discharge date: 10/12/2015  Time spent: > 35 minutes  Recommendations for Outpatient Follow-up:  1. Monitor wbc levels 2. Ensure patient f/u's with cardiologist   Discharge Diagnoses:  Principal Problem:   Sepsis secondary to UTI [facility]) Active Problems:   Depression   Duodenitis   Lower abdominal pain   Dyslipidemia   New onset atrial fibrillation (HCC)   Nausea vomiting and diarrhea   Severe sepsis [facility])   Discharge Condition: stable  Diet recommendation: heart healthy  Filed Weights   10/10/15 0110 10/12/15 0521  Weight: 77.111 kg (170 lb) 78 kg (171 lb 15.3 oz)    History of present illness:  From original HPI: 65 year old male with past medical history of depression, dyslipidemia who presented to [facility] with worsening lower abdominal pain associated with nausea, vomiting and diarrhea started last night prior to this admission. Patient reports intermittent, sharp lower abdominal pain, 10 out of 10 in intensity, pain was worse with eating and patient reports not having good by mouth intake because of the pain. He also had about 6 episodes of vomiting at home. Diagnosed with duodenitis after CT imaging evaluation  Hospital Course:  Duodenitis - Will treat patient with 10 days of antibiotic regimen - improved after initial broad spectrum antibiotic regimen IV zosyn and vancomycin - tolerating oral intake well - d/c on protonix  New onset Afib - rate controlled off rate controlling medication - will have patient f/u with cardiology as outpatient - increase his aspirin daily dose   For other known comorbidities will have patient continue home medication regimen  Procedures:  None  Consultations:  CArdiology  Discharge Exam: Filed Vitals:   10/11/15 2156 10/12/15 0521  BP: 102/65 103/68  Pulse: 88 84  Temp:  98.7 F (37.1 C) 98.3 F (36.8 C)  Resp: 18 18    General: pt in nad, alert and awake Cardiovascular: rrr, no mrg Respiratory: cta bl, no wheezes  Discharge Instructions   Discharge Instructions    Call MD for:  severe uncontrolled pain    Complete by:  As directed      Call MD for:  temperature >100.4    Complete by:  As directed      Diet - low sodium heart healthy    Complete by:  As directed      Discharge instructions    Complete by:  As directed   Please be sure to follow up with your cardiologist in 1-2 weeks after discharge.     Increase activity slowly    Complete by:  As directed           Current Discharge Medication List    START taking these medications   Details  ciprofloxacin (CIPRO) 500 MG tablet Take 1 tablet (500 mg total) by mouth 2 (two) times daily. Qty: 14 tablet, Refills: 0    metroNIDAZOLE (FLAGYL) 500 MG tablet Take 1 tablet (500 mg total) by mouth every 8 (eight) hours. Qty: 21 tablet, Refills: 0    pantoprazole (PROTONIX) 20 MG tablet Take 1 tablet (20 mg total) by mouth daily. Qty: 30 tablet, Refills: 0      CONTINUE these medications which have CHANGED   Details  aspirin 325 MG tablet Take 1 tablet (325 mg total) by mouth daily. Qty: 30 tablet, Refills: 0      CONTINUE these medications which have NOT  CHANGED   Details  amitriptyline (ELAVIL) 25 MG tablet Take 1 tablet (25 mg total) by mouth at bedtime as needed for sleep. TAKE ONE TABLET BY MOUTH AT BEDTIME.   "NEEDS OFFICE VISIT FOR ADDITIONAL REFILLS" Qty: 30 tablet, Refills: 11    atorvastatin (LIPITOR) 10 MG tablet Take 1 tablet (10 mg total) by mouth daily. Qty: 30 tablet, Refills: 11   Associated Diagnoses: Elevated cholesterol    butalbital-acetaminophen-caffeine (FIORICET, ESGIC) 50-325-40 MG tablet take 1 tablet by mouth every 6 hours if needed for headache Qty: 30 tablet, Refills: 4    PARoxetine (PAXIL) 30 MG tablet TAKE ONE (1) TABLET EACH DAY Qty: 30 tablet,  Refills: 11   Associated Diagnoses: Depression    zolpidem (AMBIEN) 10 MG tablet take 1 tablet by mouth at bedtime Qty: 30 tablet, Refills: 5       Allergies  Allergen Reactions  . Sulfa Antibiotics     childhood      The results of significant diagnostics from this hospitalization (including imaging, microbiology, ancillary and laboratory) are listed below for reference.    Significant Diagnostic Studies: Ct Abdomen Pelvis W Contrast  10/10/2015  CLINICAL DATA:  Abdominal pain with nausea and vomiting for 1 day EXAM: CT ABDOMEN AND PELVIS WITH CONTRAST TECHNIQUE: Multidetector CT imaging of the abdomen and pelvis was performed using the standard protocol following bolus administration of intravenous contrast. Oral contrast was also administered. CONTRAST:  OMNIPAQUE IOHEXOL 300 MG/ML SOLN, 25mL OMNIPAQUE IOHEXOL 300 MG/ML SOLN COMPARISON:  None. FINDINGS: Lower chest: Lung bases are clear. There is a rather minimal hiatal hernia. Hepatobiliary: No focal liver lesions are identified. There is no appreciable gallbladder wall thickening. There is no biliary duct dilatation. Pancreas: There is no pancreatic mass or inflammatory focus. Spleen: No splenic lesions are identified. Adrenals/Urinary Tract: Right adrenal appears normal. There is a 7 x 6 mm left adrenal adenoma. There is mild scarring in the posterior mid right kidney region. There is no renal mass or hydronephrosis on either side. There is no renal or ureteral calculus on either side. Urinary bladder is midline with wall thickness within normal limits. Stomach/Bowel: There are scattered sigmoid diverticula without diverticulitis. There is no small or large bowel wall thickening. No mesenteric thickening. There is mild fold thickening in the proximal duodenum without fistula or apparent focal ulceration. No bowel obstruction. No free air or portal venous air. Vascular/Lymphatic: There is no abdominal aortic aneurysm. There is rather  minimal calcification in the aorta and common iliac arteries. The major mesenteric vessels appear patent. There is no demonstrable adenopathy in the abdomen or pelvis. Reproductive: There are multiple prostatic calculi. Prostate and seminal vesicles appear normal in size and contour. There is no pelvic mass or pelvic fluid collection. Other: Appendix is absent. There is no periappendiceal region inflammation. No abscess or ascites is apparent in the abdomen or pelvis. Musculoskeletal: There is degenerative change in the lumbar spine. There is minimal anterolisthesis of L5 on S1, felt to be due to underlying spondylosis. There are small pars defects at L5 bilaterally. There is no intramuscular or abdominal wall lesions. IMPRESSION: Mild wall thickening in the proximal duodenum, suspicious for a degree of duodenitis. No ulceration or fistula but identified by CT in this area. Scattered sigmoid diverticula without diverticulitis. Appendix absent. No periappendiceal region inflammation. There is no bowel obstruction or abscess in the abdomen or pelvis. Multiple prostatic calculi. No renal or ureteral calculus. No hydronephrosis. Subcentimeter left adrenal adenoma, a finding felt  to be benign. Rather minimal hiatal hernia. 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 Electronically Signed   By: Bretta Bang III M.D.   On: 10/10/2015 07:29   Dg Abd Acute W/chest  10/10/2015  CLINICAL DATA:  Nausea, vomiting, diarrhea, and abdominal pain. EXAM: DG ABDOMEN ACUTE W/ 1V CHEST COMPARISON:  None. FINDINGS: Normal heart size and pulmonary vascularity. No focal airspace disease or consolidation in the lungs. No blunting of costophrenic angles. No pneumothorax. Mediastinal contours appear intact. Scattered gas and stool in the colon. No small or large bowel distention. No free intra-abdominal air. No abnormal air-fluid levels. No radiopaque stones. Visualized bones appear  intact. Calcified phleboliths in the pelvis. IMPRESSION: No evidence of active pulmonary disease. Normal nonobstructive bowel gas pattern. Electronically Signed   By: Burman Nieves M.D.   On: 10/10/2015 03:46    Microbiology: Recent Results (from the past 240 hour(s))  Urine culture     Status: None   Collection Time: 10/10/15  1:52 AM  Result Value Ref Range Status   Specimen Description URINE, CLEAN CATCH  Final   Special Requests NONE  Final   Culture   Final    NO GROWTH 1 DAY Performed at [facility]    Report Status 10/11/2015 FINAL  Final  Blood culture (routine x 2)     Status: None (Preliminary result)   Collection Time: 10/10/15  5:21 AM  Result Value Ref Range Status   Specimen Description BLOOD RIGHT HAND  Final   Special Requests BOTTLES DRAWN AEROBIC AND ANAEROBIC  Final   Culture   Final    NO GROWTH 2 DAYS Performed at [facility]    Report Status PENDING  Incomplete  Blood culture (routine x 2)     Status: None (Preliminary result)   Collection Time: 10/10/15  5:27 AM  Result Value Ref Range Status   Specimen Description BLOOD RIGHT ANTECUBITAL  Final   Special Requests BOTTLES DRAWN AEROBIC AND ANAEROBIC  Final   Culture   Final    NO GROWTH 2 DAYS Performed at [facility]    Report Status PENDING  Incomplete     Labs: Basic Metabolic Panel:  Recent Labs Lab 10/10/15 0124 10/11/15 0605 10/12/15 0515  NA 144 141 141  K 3.9 3.4* 3.5  CL 107 110 109  CO2 21* 24 26  GLUCOSE 144* 107* 101*  BUN 25* 14 11  CREATININE 1.11 1.10 1.17  CALCIUM 9.4 8.1* 8.5*   Liver Function Tests:  Recent Labs Lab 10/10/15 0124 10/11/15 0605  AST 30 23  ALT 21 17  ALKPHOS 103 60  BILITOT 1.4* 0.6  PROT 6.9 5.5*  ALBUMIN 4.2 3.1*    Recent Labs Lab 10/10/15 0124  LIPASE 31   No results for input(s): AMMONIA in the last 168 hours. CBC:  Recent Labs Lab 10/10/15 0124 10/11/15 0605  WBC 9.1 5.5  HGB 15.2 11.6*   HCT 44.9 35.4*  MCV 89.6 91.9  PLT 164 119*   Cardiac Enzymes: No results for input(s): CKTOTAL, CKMB, CKMBINDEX, TROPONINI in the last 168 hours. BNP: BNP (last 3 results) No results for input(s): BNP in the last 8760 hours.  ProBNP (last 3 results) No results for input(s): PROBNP in the last 8760 hours.  CBG:  Recent Labs Lab 10/12/15 0728  GLUCAP 102*       Signed:  Penny Pia MD.  [facility] 10/12/2015, 12:32  PM

## 2015-10-12 NOTE — Progress Notes (Signed)
Pt VSS. Tele d/c'd. IV removed, intact. Reviewed discharge summary, medication regimen, follow up appts at bedside. Pt understands, no other questions.

## 2015-10-15 LAB — CULTURE, BLOOD (ROUTINE X 2)
CULTURE: NO GROWTH
Culture: NO GROWTH

## 2015-10-19 HISTORY — PX: OTHER SURGICAL HISTORY: SHX169

## 2015-10-25 ENCOUNTER — Ambulatory Visit (INDEPENDENT_AMBULATORY_CARE_PROVIDER_SITE_OTHER): Payer: 59 | Admitting: Cardiology

## 2015-10-25 ENCOUNTER — Encounter: Payer: Self-pay | Admitting: Cardiology

## 2015-10-25 ENCOUNTER — Ambulatory Visit (INDEPENDENT_AMBULATORY_CARE_PROVIDER_SITE_OTHER): Payer: 59

## 2015-10-25 VITALS — BP 118/68 | HR 105 | Ht 70.0 in | Wt 168.3 lb

## 2015-10-25 DIAGNOSIS — Z8249 Family history of ischemic heart disease and other diseases of the circulatory system: Secondary | ICD-10-CM

## 2015-10-25 DIAGNOSIS — E785 Hyperlipidemia, unspecified: Secondary | ICD-10-CM | POA: Diagnosis not present

## 2015-10-25 DIAGNOSIS — I4891 Unspecified atrial fibrillation: Secondary | ICD-10-CM | POA: Diagnosis not present

## 2015-10-25 NOTE — Progress Notes (Signed)
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 The pt has no prior history of CAD or arrhythmia. He had a normal echo and stress test in June 2015 as a screening (FM Hx of CAD). The pt was admitted to [facility] with N&V diarrhea and abdominal pain. He was felt to be septic and was started on ABs and admitted. On telemetry he was noted to have PAF. He was unaware of this. His B/P was low so Diltiazem or Lopressor not added. He converted spontaneously. [name] notes he is a CAHDs VASc =0. He was discharged in NSR and is seen today in f/u. He has been doing well, no c/o of palpations.    Current Outpatient Prescriptions  Medication Sig Dispense Refill  . amitriptyline (ELAVIL) 25 MG tablet Take 1 tablet (25 mg total) by mouth at bedtime as needed for sleep. TAKE ONE TABLET BY MOUTH AT BEDTIME.   "NEEDS OFFICE VISIT FOR ADDITIONAL REFILLS" 30 tablet 11  . aspirin 325 MG tablet Take 1 tablet (325 mg total) by mouth daily. 30 tablet 0  . atorvastatin (LIPITOR) 10 MG tablet Take 1 tablet (10 mg total) by mouth daily. 30 tablet 11  . butalbital-acetaminophen-caffeine (FIORICET, ESGIC) 50-325-40 MG tablet take 1 tablet by mouth every 6 hours if needed for headache 30 tablet 4  . pantoprazole (PROTONIX) 20 MG tablet Take 1 tablet (20 mg total) by mouth daily. 30 tablet 0  . PARoxetine (PAXIL) 30 MG tablet TAKE ONE (1) TABLET EACH DAY 30 tablet 11  . zolpidem (AMBIEN) 10 MG tablet take 1 tablet by mouth at bedtime 30 tablet 5   No current facility-administered medications for this visit.    Allergies  Allergen Reactions  . Sulfa Antibiotics     childhood    Social History   Social History  . Marital Status: Married    Spouse Name: N/A  . Number of Children: N/A  . Years of Education: N/A   Occupational  History  . Not on file.   Social History Main Topics  . Smoking status: Never Smoker   . Smokeless tobacco: Never Used  . Alcohol Use: No  . Drug Use: No  . Sexual Activity:    Partners: Female   Other Topics Concern  . Not on file   Social History Narrative   Married. 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     Review of Systems: General: negative for chills, fever, night sweats or weight changes.  Cardiovascular: negative for chest pain, dyspnea on exertion, edema, orthopnea, palpitations, paroxysmal nocturnal dyspnea or shortness of breath Dermatological: negative for rash Respiratory: negative for cough or wheezing Urologic: negative for hematuria Abdominal: negative for nausea, vomiting, diarrhea, bright red blood per rectum, melena, or hematemesis Neurologic: negative for visual changes, syncope, or dizziness All other systems reviewed and are otherwise negative except as noted above.    Blood pressure 118/68, pulse 105, height  (1.778 m), weight 168 lb 4.8 oz (76.34 kg).  General appearance: alert, cooperative and no distress Neck: no carotid bruit and no JVD CV: RRR without murmur Lungs: clear to auscultation bilaterally Extremities: extremities normal, atraumatic, no cyanosis or edema Skin: Skin color, texture, turgor normal. No rashes or lesions Neurologic: Grossly normal  EKG  NSR, ST 105  ASSESSMENT AND PLAN:   New onset atrial fibrillation (HCC) PAF in setting of sepsis Jan 2017. CHADs =0  Dyslipidemia LDL 79 - on Lipitor  Jan 2017   PLAN  Will obtain 48 hr Holter to determine if he has unrecognized PAF, if so would add low dose beta blocker, if not no further treatment. F/U [name] in 4-6 weeks.    Note: TSH was WNL during recent hospitalization.  Corine Shelter K PA-C 10/25/2015 8:18 AM

## 2015-10-25 NOTE — Patient Instructions (Signed)
Your physician has recommended that you wear a 48 HOUR holter monitor. Holter monitors are medical devices that record the heart's electrical activity. Doctors most often use these monitors to diagnose arrhythmias. Arrhythmias are problems with the speed or rhythm of the heartbeat. The monitor is a small, portable device. You can wear one while you do your normal daily activities. This is usually used to diagnose what is causing palpitations/syncope (passing out).    Your physician recommends that you schedule a follow-up appointment in: 4-6 WEEKS WITH DR [facility]

## 2015-10-25 NOTE — Assessment & Plan Note (Signed)
LDL 79 - on Lipitor  Jan 2017

## 2015-10-25 NOTE — Assessment & Plan Note (Signed)
PAF in setting of sepsis Jan 2017. CHADs =0

## 2015-11-11 ENCOUNTER — Ambulatory Visit (INDEPENDENT_AMBULATORY_CARE_PROVIDER_SITE_OTHER): Payer: 59 | Admitting: Physician Assistant

## 2015-11-11 ENCOUNTER — Ambulatory Visit (INDEPENDENT_AMBULATORY_CARE_PROVIDER_SITE_OTHER): Payer: 59

## 2015-11-11 VITALS — BP 118/76 | HR 88 | Temp 98.2°F | Resp 18 | Ht 68.0 in | Wt 165.0 lb

## 2015-11-11 DIAGNOSIS — R059 Cough, unspecified: Secondary | ICD-10-CM

## 2015-11-11 DIAGNOSIS — Z6825 Body mass index (BMI) 25.0-25.9, adult: Secondary | ICD-10-CM | POA: Insufficient documentation

## 2015-11-11 DIAGNOSIS — R05 Cough: Secondary | ICD-10-CM

## 2015-11-11 MED ORDER — AZITHROMYCIN 500 MG PO TABS: 500.0000 mg | ORAL_TABLET | Freq: Every day | ORAL | Status: AC

## 2015-11-11 MED ORDER — HYDROCOD POLST-CPM POLST ER 10-8 MG/5ML PO SUER
5.0000 mL | Freq: Two times a day (BID) | ORAL | Status: DC | PRN
Start: 2015-11-11 — End: 2015-11-29

## 2015-11-11 NOTE — Progress Notes (Signed)
Patient ID: Brian [name], male    DOB: 1951/09/09, 65 y.o.   MRN: [id number]  PCP: Lucilla Edin, MD  Subjective:   Chief Complaint  Patient presents with  . Cough    since Monday    HPI Presents for evaluation of cough x 5 days.  Runny nose. Not having nasal congestion. "Raspy, deep, rattley, wet" cough. Better with lying down. "When I get vertical, I guess it settles in the lungs and gets to rattling around." Concerned that "it could get into something more serious." Used left over Robitussin AC, which was helpful, but only had a couple of doses. Mucinex capsules x 24 hours." They help, they're better than nothing."  No SOB, dizziness. CP and headache only while coughing. No GI symptoms. No GU symptoms. No unexplained myalgias/arthralgias.   Review of Systems As above.    Patient Active Problem List   Diagnosis Date Noted  . BMI 25.0-25.9,adult 11/11/2015  . Family history of coronary artery disease in father 10/25/2015  . New onset atrial fibrillation (HCC) 10/11/2015  . Dyslipidemia 10/10/2015  . Depression      Prior to Admission medications   Medication Sig Start Date End Date Taking? Authorizing Provider  amitriptyline (ELAVIL) 25 MG tablet Take 1 tablet (25 mg total) by mouth at bedtime as needed for sleep. TAKE ONE TABLET BY MOUTH AT BEDTIME.   "NEEDS OFFICE VISIT FOR ADDITIONAL REFILLS" 12/24/14  Yes Collene Gobble, MD  aspirin 325 MG tablet Take 1 tablet (325 mg total) by mouth daily. 10/12/15  Yes Penny Pia, MD  atorvastatin (LIPITOR) 10 MG tablet Take 1 tablet (10 mg total) by mouth daily. 12/24/14  Yes Collene Gobble, MD  butalbital-acetaminophen-caffeine (FIORICET, ESGIC) [phone] MG tablet take 1 tablet by mouth every 6 hours if needed for headache 09/15/15  Yes Collene Gobble, MD  PARoxetine (PAXIL) 30 MG tablet TAKE ONE (1) TABLET EACH DAY 12/24/14  Yes Collene Gobble, MD  zolpidem (AMBIEN) 10 MG tablet take 1 tablet by mouth at bedtime 08/18/15  Yes  Collene Gobble, MD     Allergies  Allergen Reactions  . Sulfa Antibiotics     childhood       Objective:  Physical Exam  Constitutional: He is oriented to person, place, and time. He appears well-developed and well-nourished. He is active and cooperative. No distress.  BP 118/76 mmHg  Pulse 88  Temp(Src) 98.2 F (36.8 C)  Resp 18  Ht  (1.727 m)  Wt 165 lb (74.844 kg)  BMI 25.09 kg/m2  SpO2 96%  HENT:  Head: Normocephalic and atraumatic.  Right Ear: Hearing normal.  Left Ear: Hearing normal.  Eyes: Conjunctivae are normal. No scleral icterus.  Neck: Normal range of motion. Neck supple. No thyromegaly present.  Cardiovascular: Normal rate, regular rhythm and normal heart sounds.   Pulses:      Radial pulses are 2+ on the right side, and 2+ on the left side.  Pulmonary/Chest: Effort normal. He has wheezes (coarse wheezes are diffuse).  Lymphadenopathy:       Head (right side): No tonsillar, no preauricular, no posterior auricular and no occipital adenopathy present.       Head (left side): No tonsillar, no preauricular, no posterior auricular and no occipital adenopathy present.    He has no cervical adenopathy.       Right: No supraclavicular adenopathy present.       Left: No supraclavicular adenopathy present.  Neurological: He is  alert and oriented to person, place, and time. No sensory deficit.  Skin: Skin is warm, dry and intact. No rash noted. No cyanosis or erythema. Nails show no clubbing.  Psychiatric: He has a normal mood and affect. His speech is normal and behavior is normal.       Dg Chest 2 View  11/11/2015  CLINICAL DATA:  Shortness of breath. EXAM: CHEST  2 VIEW COMPARISON:  10/10/2015 . FINDINGS: Mediastinum and hilar structures normal . Heart size normal. Mild left infrahilar infiltrate cannot be excluded. No pleural effusion or pneumothorax. Apical pleural thickening noted consistent with scarring. No acute bony abnormality identified. Degenerative  changes thoracic spine . IMPRESSION: Mild left infrahilar infiltrate cannot be excluded. Exam otherwise unremarkable. Electronically Signed   By: Maisie Fus  Register   On: 11/11/2015 12:08       Assessment & Plan:   1. Cough Possible Mild LEFT lower infiltrate. Cover with azithromycin. Supportive care.  Anticipatory guidance.  RTC if symptoms worsen/persist. - DG Chest 2 View; Future - azithromycin (ZITHROMAX) 500 MG tablet; Take 1 tablet (500 mg total) by mouth daily.  Dispense: 3 tablet; Refill: 0 - chlorpheniramine-HYDROcodone (TUSSIONEX PENNKINETIC ER) 10-8 MG/5ML SUER; Take 5 mLs by mouth every 12 (twelve) hours as needed for cough.  Dispense: 100 mL; Refill: 0   Fernande Bras, PA-C Physician Assistant-Certified [facility]

## 2015-11-11 NOTE — Patient Instructions (Signed)
Because you received an x-ray today, you will receive an invoice from [facility]. Please contact [facility] at [phone] with questions or concerns regarding your invoice. Our billing staff will not be able to assist you with those questions. °

## 2015-11-29 ENCOUNTER — Ambulatory Visit (INDEPENDENT_AMBULATORY_CARE_PROVIDER_SITE_OTHER): Payer: 59 | Admitting: Cardiology

## 2015-11-29 ENCOUNTER — Encounter: Payer: Self-pay | Admitting: Cardiology

## 2015-11-29 VITALS — BP 142/100 | HR 80 | Ht 70.0 in | Wt 167.0 lb

## 2015-11-29 DIAGNOSIS — I480 Paroxysmal atrial fibrillation: Secondary | ICD-10-CM

## 2015-11-29 DIAGNOSIS — E785 Hyperlipidemia, unspecified: Secondary | ICD-10-CM

## 2015-11-29 DIAGNOSIS — Z8249 Family history of ischemic heart disease and other diseases of the circulatory system: Secondary | ICD-10-CM

## 2015-11-29 NOTE — Patient Instructions (Signed)
MAY TAKE 81 MG ASPIRIN INSTEAD OF 325 MG  DAILY  NO OTHER CHANGES IN MEDICATIONS  Your physician wants you to follow-up in 12 MONTHS WITH DR HARDING.  You will receive a reminder letter in the mail two months in advance. If you don't receive a letter, please call our office to schedule the follow-up appointment.  If you need a refill on your cardiac medications before your next appointment, please call your pharmacy.

## 2015-11-29 NOTE — Progress Notes (Addendum)
PCP: Lucilla Edin, MD  Clinic Note: Chief Complaint  Patient presents with  . Follow-up     no chest pain, no shortness of breath, no edema, no pain or cramping in legs, no lightheadedness or dizziness, no fatigue  . Atrial Fibrillation    HPI: Brian [name] is a 65 y.o. male with a PMH below who presents today for Hospital follow-up #2 for atrial fibrillation.Brian [name] was last seen on Oct 25, 2015 for post-hospital f/u -- noted to be in Afib RVR while admitted for GI Viral Duodenitis.  Recent Hospitalizations:  Jan 23 - Duodenitis  Studies Reviewed:   Scanned echocardiogram from June 2015: Normal LV size and function. EF 68%. Mild MR, mild TR. No pulmonary hypertension. Otherwise essentially normal.  Holter monitor from February 2017: Mostly normal sinus rhythm. Recommended avoiding stimulants, energy drinks and decongestants. No signs of A. fib.Marland Kitchen PVCs noted in pairs, singles, bigeminy and trigeminy. No A. fib.  Interval History: Brian [name] presents here today really feeling fine. He has not had any untoward symptoms since his hospital stay. He has not had any recurrence of chest tightness pressure, no rapid irregular heartbeats. He has been totally healthy without any GI issues either. He says he really doesn't notice any palpitations. He didn't notice it is in atrial fibrillation when he was in the hospital for his GI issues. He simply noted having a little dyspnea.  Currently, he denies any chest pain or shortness of breath with rest or exertion.  No PND, orthopnea or edema.  No lightheadedness, dizziness, weakness or syncope/near syncope. No TIA/amaurosis fugax symptoms. No melena, hematochezia, hematuria, or epstaxis. No claudication.  ROS: A comprehensive was performed. Pertinent symptoms noted above Review of Systems  Constitutional: Negative for weight loss, malaise/fatigue and diaphoresis.  HENT: Negative for nosebleeds.   Respiratory: Negative for cough,  shortness of breath and wheezing.   Cardiovascular: Negative.  Negative for claudication.       Per history of present illness  Gastrointestinal: Negative for heartburn, vomiting, blood in stool and melena.  Genitourinary: Negative for hematuria.  Musculoskeletal: Negative for myalgias, joint pain and falls.  Neurological: Negative for dizziness, speech change, seizures, loss of consciousness, weakness and headaches.  Endo/Heme/Allergies: Does not bruise/bleed easily.  Psychiatric/Behavioral: The patient is not nervous/anxious.   All other systems reviewed and are negative.   Past Medical History  Diagnosis Date  . Depression   . PAF (paroxysmal atrial fibrillation) (HCC) Jan 2017    lone AF in setting of sepsis  . Sepsis secondary to UTI (HCC) 10/10/2015  . Duodenitis 10/10/2015    Past Surgical History  Procedure Laterality Date  . Appendectomy    . Vasectomy    . Cardiovascular stress test  01/22/2014    GXT - Ex 10 min (10.9 METs), HR 164 = 103% MPHR. No ST-T wave chagnes or Sx.  LOW RISK  --[facility] TM Score 10    Prior to Admission medications   Medication Sig Start Date End Date Taking? Authorizing Provider  amitriptyline (ELAVIL) 25 MG tablet Take 1 tablet (25 mg total) by mouth at bedtime as needed for sleep. TAKE ONE TABLET BY MOUTH AT BEDTIME.   "NEEDS OFFICE VISIT FOR ADDITIONAL REFILLS" 12/24/14  Yes Collene Gobble, MD  aspirin 325 MG tablet Take 1 tablet (325 mg total) by mouth daily. 10/12/15  Yes Penny Pia, MD  atorvastatin (LIPITOR) 10 MG tablet Take 1 tablet (10 mg total) by mouth daily. 12/24/14  Yes  Collene GobbleSteven A Daub, MD  butalbital-acetaminophen-caffeine (FIORICET, ESGIC) [phone] MG tablet take 1 tablet by mouth every 6 hours if needed for headache 09/15/15  Yes Collene GobbleSteven A Daub, MD  PARoxetine (PAXIL) 30 MG tablet TAKE ONE (1) TABLET EACH DAY 12/24/14  Yes Collene GobbleSteven A Daub, MD  zolpidem (AMBIEN) 10 MG tablet take 1 tablet by mouth at bedtime 08/18/15  Yes Collene GobbleSteven A Daub, MD    Allergies  Allergen Reactions  . Sulfa Antibiotics     childhood    Social History   Social History  . Marital Status: Married    Spouse Name: Dominga FerryDiane Foulk  . Number of Children: 2  . Years of Education: College   Occupational History  . retired bus [employer]    Social History Main Topics  . Smoking status: Never Smoker   . Smokeless tobacco: Never Used  . Alcohol Use: No  . Drug Use: No  . Sexual Activity:    Partners: Female   Other Topics Concern  . None   Social History Narrative   Lives with his wife, a Teacher, early years/prepharmacist.   Family History  Problem Relation Age of Onset  . Coronary artery disease    . Cancer    . Stroke    . Arrhythmia    . Cancer Mother   . Cancer Father   . Heart disease Father   . Stroke Father   . Cancer Maternal Grandmother   . Stroke Maternal Grandfather     Wt Readings from Last 3 Encounters:  11/29/15 167 lb (75.751 kg)  11/11/15 165 lb (74.844 kg)  10/25/15 168 lb 4.8 oz (76.34 kg)    PHYSICAL EXAM BP 142/100 mmHg  Pulse 80  Ht 5\' 10"  (1.778 m)  Wt 167 lb (75.751 kg)  BMI 23.96 kg/m2 General appearance: alert, cooperative, appears stated age, no distress and Well-nourished, well-groomed Neck: no adenopathy, no carotid bruit and no JVD Lungs: clear to auscultation bilaterally, normal percussion bilaterally and non-labored Heart: regular rate and rhythm, S1& S2 normal, no murmur, click, rub or gallop; nondisplaced PMI Abdomen: soft, non-tender; bowel sounds normal; no masses,  no organomegaly; no HJR Extremities: extremities normal, atraumatic, no cyanosis or edema Pulses: 2+ and symmetric;  Skin: mobility and turgor normal, no edema, no evidence of bleeding or bruising and no lesions noted  Neurologic: Mental status: Alert, oriented, thought content appropriate Cranial nerves: normal (II-XII grossly intact)    Adult ECG Report - not checked  Other studies Reviewed: Additional studies/ records that were reviewed today  include:  Recent Labs:  No new labs    ASSESSMENT / PLAN: -His blood pressure is little elevated today. Will need to monitor for true hypertension   Problem List Items Addressed This Visit    Paroxysmal atrial fibrillation (HCC) - Primary (Chronic)    No further recurrences. I think this may have simply been triggered by the GI event. I don't think we need to look for ischemic evaluation at this time. He would prefer to hold off on that. Distantly continue risk factor management. If he has recurrences then we would need to consider an ischemic evaluation plus or minus beta blocker.  Based on his CHADS2VASC2 score 0, he is only on baby aspirin.      Family history of coronary artery disease in father (Chronic)    His father was a heavy smoker with diabetes. He is neither. He is terrified of having his father's outcome, therefore has been very active, nonsmoking and as shy  away from things would make him on healthy. For risk reduction he is on atorvastatin and aspirin which we can reduce to 81 mg. He is not having any anginal symptoms to suggest an ischemic etiology of A. fib. It is probably more related to his ongoing illness at the time.      Dyslipidemia (Chronic)    On Lipitor. Pretty well-controlled on most recent labs.         Current medicines are reviewed at length with the patient today. (+/- concerns) n/a The following changes have been made: none MAY TAKE 81 MG ASPIRIN INSTEAD OF 325 MG  DAILY  NO OTHER CHANGES IN MEDICATIONS  Your physician wants you to follow-up in 12 MONTHS WITH DR Haynes Giannotti.  Studies Ordered:   No orders of the defined types were placed in this encounter.      Marykay Lex, M.D., M.S. Interventional Cardiologist   Pager # [phone] Phone # [phone] [address] [place], [place] [address]

## 2015-12-01 ENCOUNTER — Encounter: Payer: Self-pay | Admitting: Cardiology

## 2015-12-01 NOTE — Assessment & Plan Note (Signed)
No further recurrences. I think this may have simply been triggered by the GI event. I don't think we need to look for ischemic evaluation at this time. He would prefer to hold off on that. Distantly continue risk factor management. If he has recurrences then we would need to consider an ischemic evaluation plus or minus beta blocker.  Based on his CHADS2VASC2 score 0, he is only on baby aspirin.

## 2015-12-01 NOTE — Assessment & Plan Note (Signed)
On Lipitor. Pretty well-controlled on most recent labs.

## 2015-12-01 NOTE — Assessment & Plan Note (Signed)
His father was a heavy smoker with diabetes. He is neither. He is terrified of having his father's outcome, therefore has been very active, nonsmoking and as shy away from things would make him on healthy. For risk reduction he is on atorvastatin and aspirin which we can reduce to 81 mg. He is not having any anginal symptoms to suggest an ischemic etiology of A. fib. It is probably more related to his ongoing illness at the time.

## 2015-12-17 ENCOUNTER — Other Ambulatory Visit: Payer: Self-pay | Admitting: Emergency Medicine

## 2016-01-05 ENCOUNTER — Ambulatory Visit (INDEPENDENT_AMBULATORY_CARE_PROVIDER_SITE_OTHER): Payer: 59 | Admitting: Emergency Medicine

## 2016-01-05 ENCOUNTER — Encounter: Payer: Self-pay | Admitting: Emergency Medicine

## 2016-01-05 VITALS — BP 118/90 | HR 105 | Temp 98.0°F | Resp 16 | Ht 68.0 in | Wt 165.0 lb

## 2016-01-05 DIAGNOSIS — R972 Elevated prostate specific antigen [PSA]: Secondary | ICD-10-CM

## 2016-01-05 DIAGNOSIS — L989 Disorder of the skin and subcutaneous tissue, unspecified: Secondary | ICD-10-CM

## 2016-01-05 DIAGNOSIS — E7800 Pure hypercholesterolemia, unspecified: Secondary | ICD-10-CM | POA: Diagnosis not present

## 2016-01-05 DIAGNOSIS — G44039 Episodic paroxysmal hemicrania, not intractable: Secondary | ICD-10-CM

## 2016-01-05 DIAGNOSIS — Z0000 Encounter for general adult medical examination without abnormal findings: Secondary | ICD-10-CM | POA: Diagnosis not present

## 2016-01-05 DIAGNOSIS — F329 Major depressive disorder, single episode, unspecified: Secondary | ICD-10-CM | POA: Diagnosis not present

## 2016-01-05 DIAGNOSIS — H9193 Unspecified hearing loss, bilateral: Secondary | ICD-10-CM

## 2016-01-05 DIAGNOSIS — G4700 Insomnia, unspecified: Secondary | ICD-10-CM | POA: Diagnosis not present

## 2016-01-05 DIAGNOSIS — Z1159 Encounter for screening for other viral diseases: Secondary | ICD-10-CM | POA: Diagnosis not present

## 2016-01-05 DIAGNOSIS — F32A Depression, unspecified: Secondary | ICD-10-CM

## 2016-01-05 LAB — POCT URINALYSIS DIP (MANUAL ENTRY)
BILIRUBIN UA: NEGATIVE
BILIRUBIN UA: NEGATIVE
Blood, UA: NEGATIVE
Glucose, UA: NEGATIVE
LEUKOCYTES UA: NEGATIVE
NITRITE UA: NEGATIVE
PH UA: 5.5
Protein Ur, POC: NEGATIVE
Spec Grav, UA: 1.025
Urobilinogen, UA: 0.2

## 2016-01-05 LAB — CBC WITH DIFFERENTIAL/PLATELET
BASOS PCT: 1 %
Basophils Absolute: 44 cells/uL (ref 0–200)
EOS ABS: 132 {cells}/uL (ref 15–500)
Eosinophils Relative: 3 %
HEMATOCRIT: 41.9 % (ref 38.5–50.0)
HEMOGLOBIN: 14.1 g/dL (ref 13.2–17.1)
LYMPHS ABS: 1628 {cells}/uL (ref 850–3900)
Lymphocytes Relative: 37 %
MCH: 29.2 pg (ref 27.0–33.0)
MCHC: 33.7 g/dL (ref 32.0–36.0)
MCV: 86.7 fL (ref 80.0–100.0)
MONO ABS: 352 {cells}/uL (ref 200–950)
MPV: 9.6 fL (ref 7.5–12.5)
Monocytes Relative: 8 %
Neutro Abs: 2244 cells/uL (ref 1500–7800)
Neutrophils Relative %: 51 %
PLATELETS: 184 10*3/uL (ref 140–400)
RBC: 4.83 MIL/uL (ref 4.20–5.80)
RDW: 14.6 % (ref 11.0–15.0)
WBC: 4.4 10*3/uL (ref 3.8–10.8)

## 2016-01-05 LAB — COMPLETE METABOLIC PANEL WITH GFR
ALBUMIN: 4 g/dL (ref 3.6–5.1)
ALT: 29 U/L (ref 9–46)
AST: 28 U/L (ref 10–35)
Alkaline Phosphatase: 86 U/L (ref 40–115)
BUN: 14 mg/dL (ref 7–25)
CALCIUM: 9.1 mg/dL (ref 8.6–10.3)
CHLORIDE: 105 mmol/L (ref 98–110)
CO2: 25 mmol/L (ref 20–31)
CREATININE: 0.8 mg/dL (ref 0.70–1.25)
GFR, Est African American: >89 mL/min (ref >=60)
GFR, Est Non African American: >89 mL/min (ref >=60)
Glucose, Bld: 92 mg/dL (ref 65–99)
Potassium: 4 mmol/L (ref 3.5–5.3)
Sodium: 140 mmol/L (ref 135–146)
Total Bilirubin: 0.4 mg/dL (ref 0.2–1.2)
Total Protein: 6.3 g/dL (ref 6.1–8.1)

## 2016-01-05 LAB — POC MICROSCOPIC URINALYSIS (UMFC): Mucus: ABSENT

## 2016-01-05 LAB — LIPID PANEL
CHOLESTEROL: 151 mg/dL (ref 125–200)
HDL: 55 mg/dL (ref >=40)
LDL Cholesterol: 81 mg/dL (ref <130)
Total CHOL/HDL Ratio: 2.7 Ratio (ref <=5.0)
Triglycerides: 77 mg/dL (ref <150)
VLDL: 15 mg/dL (ref <30)

## 2016-01-05 LAB — HEPATITIS C ANTIBODY: HCV Ab: NEGATIVE

## 2016-01-05 LAB — TSH: TSH: 1.58 m[IU]/L (ref 0.40–4.50)

## 2016-01-05 MED ORDER — BUTALBITAL-APAP-CAFFEINE 50-325-40 MG PO TABS
ORAL_TABLET | ORAL | Status: DC
Start: 2016-01-05 — End: 2016-11-12

## 2016-01-05 MED ORDER — PAROXETINE HCL 30 MG PO TABS: 30.0000 mg | ORAL_TABLET | Freq: Every day | ORAL | Status: DC

## 2016-01-05 MED ORDER — ATORVASTATIN CALCIUM 10 MG PO TABS: 10.0000 mg | ORAL_TABLET | Freq: Every day | ORAL | Status: DC

## 2016-01-05 MED ORDER — AMITRIPTYLINE HCL 25 MG PO TABS: 25.0000 mg | ORAL_TABLET | Freq: Every evening | ORAL | Status: DC | PRN

## 2016-01-05 MED ORDER — ZOLPIDEM TARTRATE 10 MG PO TABS: 10.0000 mg | ORAL_TABLET | Freq: Every day | ORAL | Status: DC

## 2016-01-05 NOTE — Patient Instructions (Addendum)
   IF you received an x-ray today, you will receive an invoice from [facility]. Please contact [facility] at [phone] with questions or concerns regarding your invoice.   IF you received labwork today, you will receive an invoice from [facility]. Please contact [facility] at [phone] with questions or concerns regarding your invoice.   Our billing staff will not be able to assist you with questions regarding bills from these companies.  You will be contacted with the lab results as soon as they are available. The fastest way to get your results is to activate your My Chart account. Instructions are located on the last page of this paperwork. If you have not heard from us regarding the results in 2 weeks, please contact this office.     Health mainHealth Maintenance, Male A healthy lifestyle and preventative care can promote health and wellness.  Maintain regular health, dental, and eye exams.  Eat a healthy diet. Foods like vegetables, fruits, whole grains, low-fat dairy products, and lean protein foods contain the nutrients you need and are low in calories. Decrease your intake of foods high in solid fats, added sugars, and salt. Get information about a proper diet from your health care provider, if necessary.  Regular physical exercise is one of the most important things you can do for your health. Most adults should get at least 150 minutes of moderate-intensity exercise (any activity that increases your heart rate and causes you to sweat) each week. In addition, most adults need muscle-strengthening exercises on 2 or more days a week.   Maintain a healthy weight. The body mass index (BMI) is a screening tool to identify possible weight problems. It provides an estimate of body fat based on height and weight. Your health care provider can find your BMI and can help you achieve or maintain a healthy weight. For males 20 years and  older:  A BMI below 18.5 is considered underweight.  A BMI of 18.5 to 24.9 is normal.  A BMI of 25 to 29.9 is considered overweight.  A BMI of 30 and above is considered obese.  Maintain normal blood lipids and cholesterol by exercising and minimizing your intake of saturated fat. Eat a balanced diet with plenty of fruits and vegetables. Blood tests for lipids and cholesterol should begin at age 20 and be repeated every 5 years. If your lipid or cholesterol levels are high, you are over age 50, or you are at high risk for heart disease, you may need your cholesterol levels checked more frequently.Ongoing high lipid and cholesterol levels should be treated with medicines if diet and exercise are not working.  If you smoke, find out from your health care provider how to quit. If you do not use tobacco, do not start.  Lung cancer screening is recommended for adults aged 55-80 years who are at high risk for developing lung cancer because of a history of smoking. A yearly low-dose CT scan of the lungs is recommended for people who have at least a 30-pack-year history of smoking and are current smokers or have quit within the past 15 years. A pack year of smoking is smoking an average of 1 pack of cigarettes a day for 1 year (for example, a 30-pack-year history of smoking could mean smoking 1 pack a day for 30 years or 2 packs a day for 15 years). Yearly screening should continue until the smoker has stopped smoking for at least 15 years. Yearly screening should   be stopped for people who develop a health problem that would prevent them from having lung cancer treatment.  If you choose to drink alcohol, do not have more than 2 drinks per day. One drink is considered to be 12 oz (360 mL) of beer, 5 oz (150 mL) of wine, or 1.5 oz (45 mL) of liquor.  Avoid the use of street drugs. Do not share needles with anyone. Ask for help if you need support or instructions about stopping the use of drugs.  High  blood pressure causes heart disease and increases the risk of stroke. High blood pressure is more likely to develop in:  People who have blood pressure in the end of the normal range (100-139/85-89 mm Hg).  People who are overweight or obese.  People who are African American.  If you are 18-39 years of age, have your blood pressure checked every 3-5 years. If you are 40 years of age or older, have your blood pressure checked every year. You should have your blood pressure measured twice--once when you are at a hospital or clinic, and once when you are not at a hospital or clinic. Record the average of the two measurements. To check your blood pressure when you are not at a hospital or clinic, you can use:  An automated blood pressure machine at a pharmacy.  A home blood pressure monitor.  If you are 45-79 years old, ask your health care provider if you should take aspirin to prevent heart disease.  Diabetes screening involves taking a blood sample to check your fasting blood sugar level. This should be done once every 3 years after age 45 if you are at a normal weight and without risk factors for diabetes. Testing should be considered at a younger age or be carried out more frequently if you are overweight and have at least 1 risk factor for diabetes.  Colorectal cancer can be detected and often prevented. Most routine colorectal cancer screening begins at the age of 50 and continues through age 75. However, your health care provider may recommend screening at an earlier age if you have risk factors for colon cancer. On a yearly basis, your health care provider may provide home test kits to check for hidden blood in the stool. A small camera at the end of a tube may be used to directly examine the colon (sigmoidoscopy or colonoscopy) to detect the earliest forms of colorectal cancer. Talk to your health care provider about this at age 50 when routine screening begins. A direct exam of the colon  should be repeated every 5-10 years through age 75, unless early forms of precancerous polyps or small growths are found.  People who are at an increased risk for hepatitis B should be screened for this virus. You are considered at high risk for hepatitis B if:  You were born in a country where hepatitis B occurs often. Talk with your health care provider about which countries are considered high risk.  Your parents were born in a high-risk country and you have not received a shot to protect against hepatitis B (hepatitis B vaccine).  You have HIV or AIDS.  You use needles to inject street drugs.  You live with, or have sex with, someone who has hepatitis B.  You are a man who has sex with other men (MSM).  You get hemodialysis treatment.  You take certain medicines for conditions like cancer, organ transplantation, and autoimmune conditions.  Hepatitis C blood testing is   recommended for all people born from 1945 through 1965 and any individual with known risk factors for hepatitis C.  Healthy men should no longer receive prostate-specific antigen (PSA) blood tests as part of routine cancer screening. Talk to your health care provider about prostate cancer screening.  Testicular cancer screening is not recommended for adolescents or adult males who have no symptoms. Screening includes self-exam, a health care provider exam, and other screening tests. Consult with your health care provider about any symptoms you have or any concerns you have about testicular cancer.  Practice safe sex. Use condoms and avoid high-risk sexual practices to reduce the spread of sexually transmitted infections (STIs).  You should be screened for STIs, including gonorrhea and chlamydia if:  You are sexually active and are younger than 24 years.  You are older than 24 years, and your health care provider tells you that you are at risk for this type of infection.  Your sexual activity has changed since you  were last screened, and you are at an increased risk for chlamydia or gonorrhea. Ask your health care provider if you are at risk.  If you are at risk of being infected with HIV, it is recommended that you take a prescription medicine daily to prevent HIV infection. This is called pre-exposure prophylaxis (PrEP). You are considered at risk if:  You are a man who has sex with other men (MSM).  You are a heterosexual man who is sexually active with multiple partners.  You take drugs by injection.  You are sexually active with a partner who has HIV.  Talk with your health care provider about whether you are at high risk of being infected with HIV. If you choose to begin PrEP, you should first be tested for HIV. You should then be tested every 3 months for as long as you are taking PrEP.  Use sunscreen. Apply sunscreen liberally and repeatedly throughout the day. You should seek shade when your shadow is shorter than you. Protect yourself by wearing long sleeves, pants, a wide-brimmed hat, and sunglasses year round whenever you are outdoors.  Tell your health care provider of new moles or changes in moles, especially if there is a change in shape or color. Also, tell your health care provider if a mole is larger than the size of a pencil eraser.  A one-time screening for abdominal aortic aneurysm (AAA) and surgical repair of large AAAs by ultrasound is recommended for men aged 65-75 years who are current or former smokers.  Stay current with your vaccines (immunizations).   This information is not intended to replace advice given to you by your health care provider. Make sure you discuss any questions you have with your health care provider.   Document Released: 03/01/2008 Document Revised: 09/24/2014 Document Reviewed: 01/29/2011 Elsevier Interactive Patient Education 2016 [employer]  

## 2016-01-05 NOTE — Progress Notes (Signed)
Patient ID: Brian BellowWilliam Helminiak, male   DOB: 07/12/1951, 65 y.o.   MRN: [id number]     By signing my name below, I, Littie Deedsichard Sun, attest that this documentation has been prepared under the direction and in the presence of Lesle ChrisSteven Shawana Knoch, MD.  Electronically Signed: Littie Deedsichard Sun, Medical Scribe. 01/05/2016. 9:27 AM.   Chief Complaint:  Chief Complaint  Patient presents with  . Annual Exam  . Medication Refill    HPI: Brian [name] is a 65 y.o. male with a history of PAF who [facility] for a complete physical exam.   Patient was admitted to the hospital for duodenitis 3 months ago and was also found to have new onset atrial fibrillation at the time. He saw Dr. Herbie BaltimoreHarding last month and was told to cut back on his caffeine. He will see Dr. Herbie BaltimoreHarding in a year. He states he is asymptomatic when he is arrhythmic and could not tell he was in atrial fibrillation while he was in the hospital.   His last colonoscopy was 2 years ago, which was normal. He sees his dentist twice a year and his eye doctor once a year. Patient is getting a dog in a few weeks and plans to start exercising more regularly then.  Past Medical History  Diagnosis Date  . Depression   . PAF (paroxysmal atrial fibrillation) (HCC) Jan 2017    lone AF in setting of sepsis  . Sepsis secondary to UTI (HCC) 10/10/2015  . Duodenitis 10/10/2015   Past Surgical History  Procedure Laterality Date  . Appendectomy    . Vasectomy    . Cardiovascular stress test  01/22/2014    GXT - Ex 10 min (10.9 METs), HR 164 = 103% MPHR. No ST-T wave chagnes or Sx.  LOW RISK  --[facility] TM Score 10    Social History   Social History  . Marital Status: Married    Spouse Name: Dominga FerryDiane Speights  . Number of Children: 2  . Years of Education: College   Occupational History  . retired bus [employer]    Social History Main Topics  . Smoking status: Never Smoker   . Smokeless tobacco: Never Used  . Alcohol Use: No  . Drug Use: No  . Sexual Activity:   Partners: Female   Other Topics Concern  . None   Social History Narrative   Lives with his wife, a Teacher, early years/prepharmacist.   Family History  Problem Relation Age of Onset  . Coronary artery disease    . Cancer    . Stroke    . Arrhythmia    . Cancer Mother   . Cancer Father   . Heart disease Father   . Stroke Father   . Cancer Maternal Grandmother   . Stroke Maternal Grandfather    Allergies  Allergen Reactions  . Sulfa Antibiotics     childhood   Prior to Admission medications   Medication Sig Start Date End Date Taking? Authorizing Provider  amitriptyline (ELAVIL) 25 MG tablet Take 1 tablet (25 mg total) by mouth at bedtime as needed for sleep. TAKE ONE TABLET BY MOUTH AT BEDTIME.   "NEEDS OFFICE VISIT FOR ADDITIONAL REFILLS" 12/24/14  Yes Collene GobbleSteven A Mieczyslaw Stamas, MD  aspirin 81 MG tablet Take 81 mg by mouth daily.   Yes Historical Provider, MD  atorvastatin (LIPITOR) 10 MG tablet Take 1 tablet (10 mg total) by mouth daily. 12/24/14  Yes Collene GobbleSteven A Azha Constantin, MD  butalbital-acetaminophen-caffeine (FIORICET, ESGIC) [phone] MG tablet take 1  tablet by mouth every 6 hours if needed for headache 09/15/15  Yes Collene Gobble, MD  PARoxetine (PAXIL) 30 MG tablet take 1 tablet by mouth once daily 12/18/15  Yes Collene Gobble, MD  zolpidem (AMBIEN) 10 MG tablet take 1 tablet by mouth at bedtime 08/18/15  Yes Collene Gobble, MD     ROS: The patient denies fevers, chills, night sweats, unintentional weight loss, chest pain, palpitations, wheezing, dyspnea on exertion, nausea, vomiting, abdominal pain, dysuria, hematuria, melena, numbness, weakness, or tingling.   All other systems have been reviewed and were otherwise negative with the exception of those mentioned in the HPI and as above.    PHYSICAL EXAM: Filed Vitals:   01/05/16 0917 01/05/16 0923  BP: 120/90 118/90  Pulse: 105   Temp: 98 F (36.7 C)   Resp: 16    Body mass index is 25.09 kg/(m^2).   General: Alert, no acute distress HEENT:   Normocephalic, atraumatic, oropharynx patent. Eye: Nonie Hoyer [facility] Cardiovascular:  Regular rate and rhythm, no rubs murmurs or gallops.  No Carotid bruits, radial pulse intact. No pedal edema.  Respiratory: Clear to auscultation bilaterally.  No wheezes, rales, or rhonchi.  No cyanosis, no use of accessory musculature Abdominal: No organomegaly, abdomen is soft and non-tender, positive bowel sounds.  No masses. Musculoskeletal: Gait intact. No edema, tenderness Skin: Multiple nevi and moles, non-suspicious appearing. Neurologic: Facial musculature symmetric. Psychiatric: Patient acts appropriately throughout our interaction. Lymphatic: No cervical or submandibular lymphadenopathy Genitourinary: Prostate normal size, non-tender.    LABS:    EKG/XRAY:   Primary read interpreted by Dr. Cleta Alberts at [facility].   ASSESSMENT/PLAN: Patient overall doing well.He required hospitalization for a intestinal infection and was noted to have atrial fib. He followed up with Dr. Herbie Baltimore cardiologist and had a Holter monitor showing PVCs and PACs.He is not currently on any type of anticoagulation. He feels well. He is going to start a walking program. His depression is under control and he is sleeping well. There will be no changes in his medication regimen.He needs check up on a yearly basis. His last PSA was between 2 and 3. His prostate exam was normal and PSA was done today. Referrals made to dermatology for skin check and to audiology for hearing check.I personally performed the services described in this documentation, which was scribed in my presence. The recorded information has been reviewed and is accurate.  Gross sideeffects, risk and benefits, and alternatives of medications d/w patient. Patient is aware that all medications have potential sideeffects and we are unable to predict every sideeffect or drug-drug interaction that may occur.  Lesle Chris MD 01/05/2016 9:27 AM

## 2016-01-06 LAB — PSA: PSA: 1.97 ng/mL (ref <=4.00)

## 2016-01-18 ENCOUNTER — Other Ambulatory Visit: Payer: Self-pay | Admitting: Emergency Medicine

## 2016-01-21 ENCOUNTER — Other Ambulatory Visit: Payer: Self-pay | Admitting: Emergency Medicine

## 2016-02-19 ENCOUNTER — Other Ambulatory Visit: Payer: Self-pay | Admitting: Emergency Medicine

## 2016-02-22 ENCOUNTER — Telehealth: Payer: Self-pay

## 2016-02-22 NOTE — Telephone Encounter (Signed)
Pt is needing a refill on his paxil and the pharmacy is stating they do not have a rx for him on this medication and the wife is hoping to get this with refills   Best number [phone]

## 2016-02-23 NOTE — Telephone Encounter (Signed)
Notified pt RFs were sent in yesterday.

## 2016-06-02 IMAGING — CR DG CHEST 2V
3 series · 3 of 3 positions shown · non-contrast
Comparison: 10/10/2015 .

CLINICAL DATA: Shortness of breath.

EXAM:
CHEST  2 VIEW

[PA (1 of 2)]
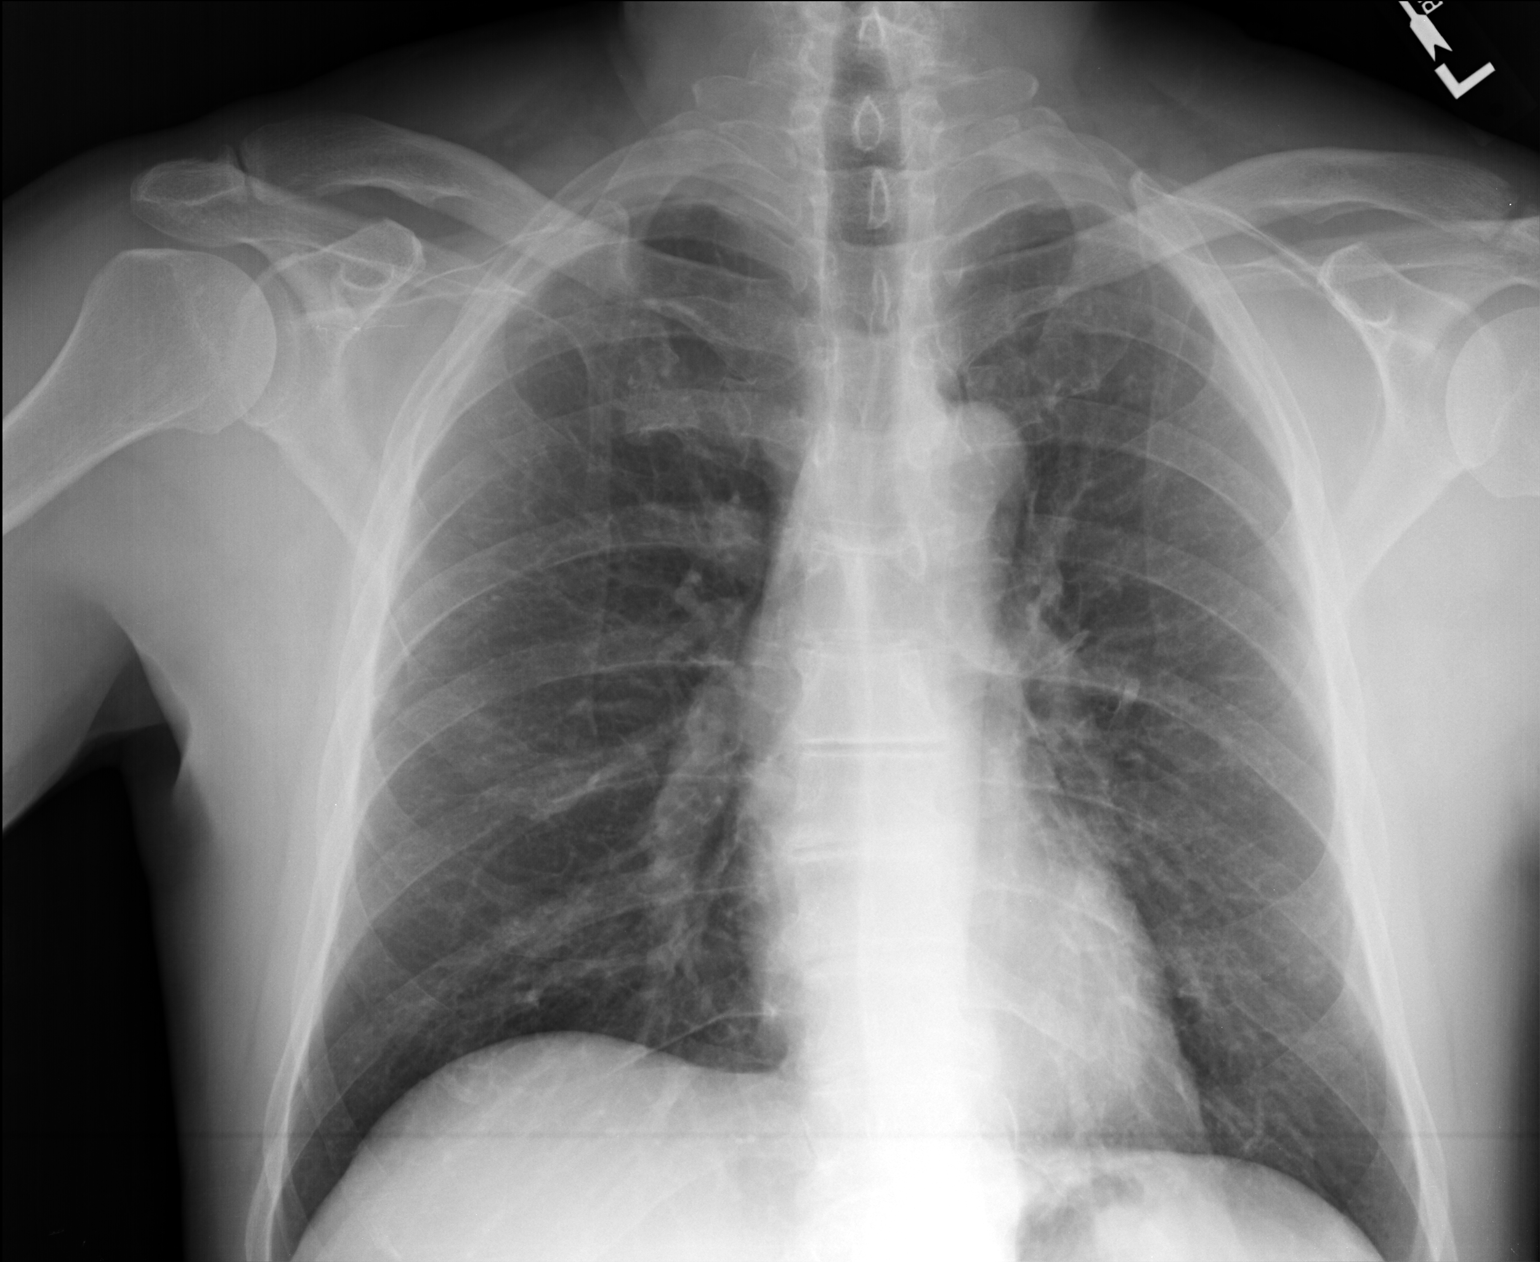

[lateral]
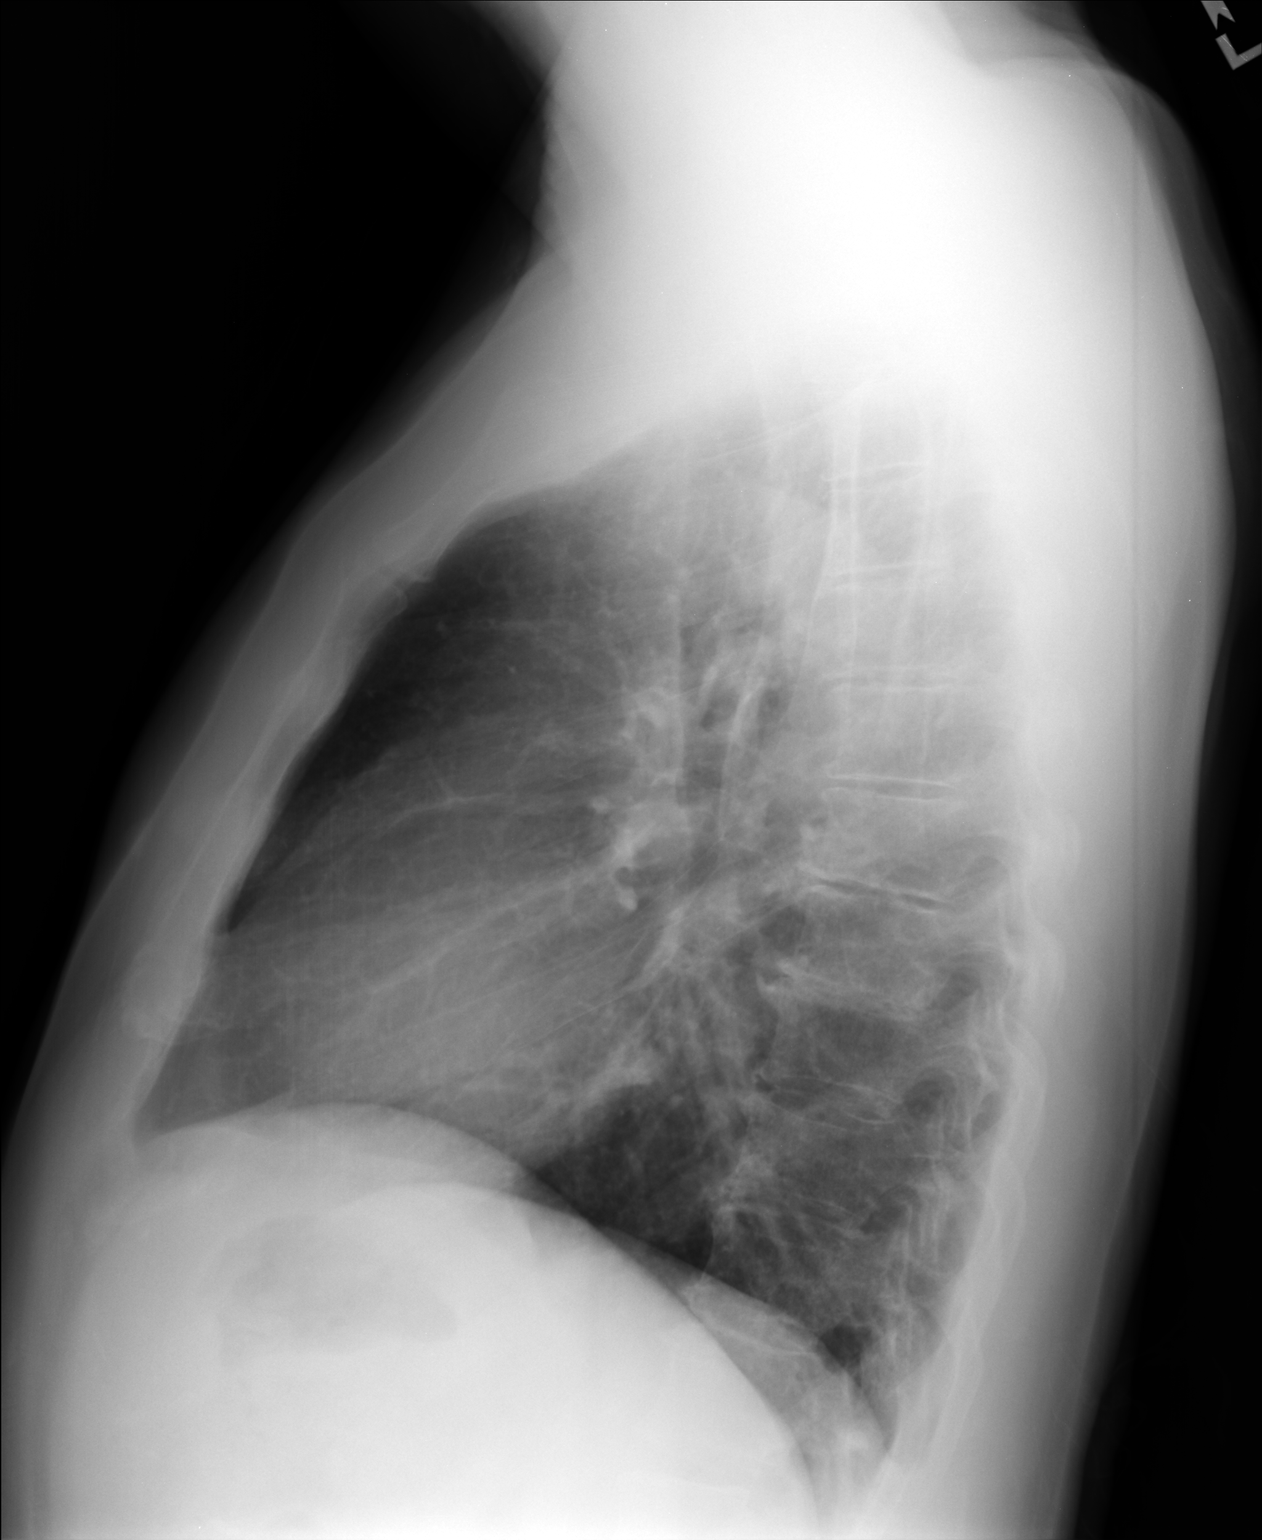

[PA (2 of 2)]
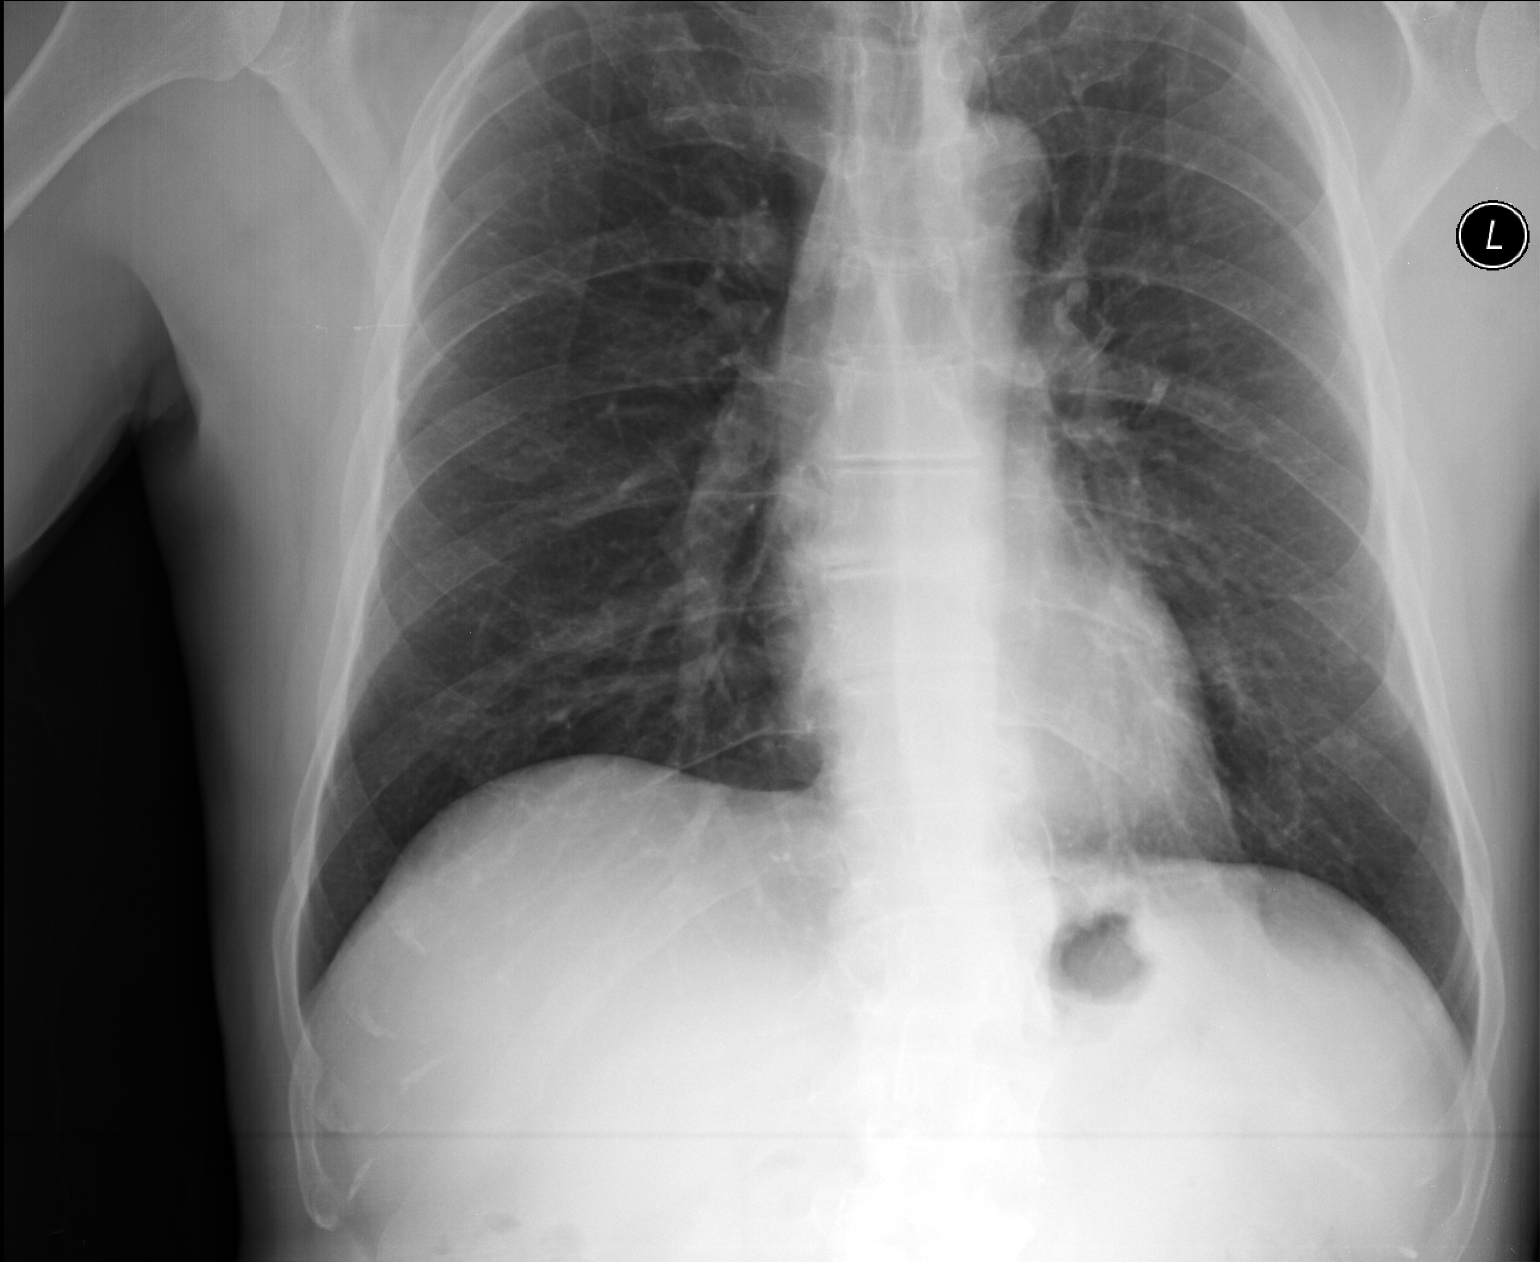

[3 of 3 positions shown; findings below may reference images not displayed]

FINDINGS: Mediastinum and hilar structures normal . Heart size normal. Mild
left infrahilar infiltrate cannot be excluded. No pleural effusion
or pneumothorax. Apical pleural thickening noted consistent with
scarring. No acute bony abnormality identified. Degenerative changes
thoracic spine .
IMPRESSION: Mild left infrahilar infiltrate cannot be excluded. Exam otherwise
unremarkable.

## 2016-08-11 ENCOUNTER — Other Ambulatory Visit: Payer: Self-pay | Admitting: Emergency Medicine

## 2016-08-11 DIAGNOSIS — G4700 Insomnia, unspecified: Secondary | ICD-10-CM

## 2016-08-14 ENCOUNTER — Telehealth: Payer: Self-pay | Admitting: *Deleted

## 2016-08-14 NOTE — Telephone Encounter (Signed)
Called in Ambien  Changed Paxil,Ambien to 0 refills spoke with pharmacist

## 2016-08-14 NOTE — Telephone Encounter (Signed)
Left Detailed message that patient will need to schedule an appointment to establish care with a doctor comfortable filling Ambien.  He will be given 1 month refill

## 2016-10-04 ENCOUNTER — Ambulatory Visit: Payer: 59 | Admitting: Family Medicine

## 2016-10-04 ENCOUNTER — Other Ambulatory Visit: Payer: Self-pay | Admitting: Family Medicine

## 2016-10-04 DIAGNOSIS — G4700 Insomnia, unspecified: Secondary | ICD-10-CM

## 2016-10-05 ENCOUNTER — Other Ambulatory Visit: Payer: Self-pay | Admitting: Emergency Medicine

## 2016-10-05 NOTE — Telephone Encounter (Signed)
°  Relation to YQ:MVHQpt:self Call back number:[phone] Pharmacy: [id number] [address] - , [place] - [phone] (Phone) [phone] (Fax)    Reason for call:  Patient requesting a refill PARoxetine (PAXIL) 30 MG tablet, patient scheduled new patient 11/12/16 (Mon).

## 2016-10-08 MED ORDER — PAROXETINE HCL 30 MG PO TABS: 30.0000 mg | ORAL_TABLET | Freq: Every day | ORAL | 1 refills | Status: DC

## 2016-10-09 ENCOUNTER — Other Ambulatory Visit: Payer: Self-pay | Admitting: Emergency Medicine

## 2016-10-12 ENCOUNTER — Other Ambulatory Visit: Payer: Self-pay | Admitting: Emergency Medicine

## 2016-10-12 DIAGNOSIS — G4700 Insomnia, unspecified: Secondary | ICD-10-CM

## 2016-10-12 MED ORDER — ZOLPIDEM TARTRATE 10 MG PO TABS: 10.0000 mg | ORAL_TABLET | Freq: Every day | ORAL | 0 refills | Status: DC

## 2016-10-12 NOTE — Telephone Encounter (Signed)
Called pharmacy- it looks like Deliah BostonMichael Clark PA-C had printed off an rx for 30 with 5 RF back in November- however this rx was actually called in as 30 with NO RF.  Authorized one RF of 30 for pt

## 2016-10-12 NOTE — Telephone Encounter (Signed)
[facility]  Refill on zolpidem (AMBIEN) 10 MG tablet. Former pt of Dr. Cleta Albertsaub. Pt has new pt appt schedule for 11/12/16. Last filled 08/14/16. Is it ok to refill? Please advise.

## 2016-11-09 ENCOUNTER — Telehealth: Payer: Self-pay

## 2016-11-09 NOTE — Telephone Encounter (Signed)
Pre visit calll made to patient.

## 2016-11-12 ENCOUNTER — Ambulatory Visit (INDEPENDENT_AMBULATORY_CARE_PROVIDER_SITE_OTHER): Payer: 59 | Admitting: Family Medicine

## 2016-11-12 ENCOUNTER — Encounter: Payer: Self-pay | Admitting: Family Medicine

## 2016-11-12 VITALS — BP 110/84 | HR 85 | Temp 97.9°F | Ht 68.0 in | Wt 167.4 lb

## 2016-11-12 DIAGNOSIS — Z0000 Encounter for general adult medical examination without abnormal findings: Secondary | ICD-10-CM

## 2016-11-12 DIAGNOSIS — F329 Major depressive disorder, single episode, unspecified: Secondary | ICD-10-CM

## 2016-11-12 DIAGNOSIS — Z131 Encounter for screening for diabetes mellitus: Secondary | ICD-10-CM

## 2016-11-12 DIAGNOSIS — Z125 Encounter for screening for malignant neoplasm of prostate: Secondary | ICD-10-CM | POA: Diagnosis not present

## 2016-11-12 DIAGNOSIS — H9193 Unspecified hearing loss, bilateral: Secondary | ICD-10-CM | POA: Diagnosis not present

## 2016-11-12 DIAGNOSIS — G4700 Insomnia, unspecified: Secondary | ICD-10-CM

## 2016-11-12 DIAGNOSIS — E7800 Pure hypercholesterolemia, unspecified: Secondary | ICD-10-CM

## 2016-11-12 DIAGNOSIS — Z130 Encounter for screening for diseases of the blood and blood-forming organs and certain disorders involving the immune mechanism: Secondary | ICD-10-CM | POA: Diagnosis not present

## 2016-11-12 DIAGNOSIS — G43C0 Periodic headache syndromes in child or adult, not intractable: Secondary | ICD-10-CM | POA: Diagnosis not present

## 2016-11-12 DIAGNOSIS — G44039 Episodic paroxysmal hemicrania, not intractable: Secondary | ICD-10-CM | POA: Diagnosis not present

## 2016-11-12 DIAGNOSIS — F32A Depression, unspecified: Secondary | ICD-10-CM

## 2016-11-12 LAB — LIPID PANEL
CHOLESTEROL: 148 mg/dL (ref 0–200)
HDL: 51.6 mg/dL (ref >39.00)
LDL Cholesterol: 80 mg/dL (ref 0–99)
NONHDL: 96.71
Total CHOL/HDL Ratio: 3
Triglycerides: 85 mg/dL (ref 0.0–149.0)
VLDL: 17 mg/dL (ref 0.0–40.0)

## 2016-11-12 LAB — COMPREHENSIVE METABOLIC PANEL
ALBUMIN: 4.1 g/dL (ref 3.5–5.2)
ALK PHOS: 106 U/L (ref 39–117)
ALT: 17 U/L (ref 0–53)
AST: 20 U/L (ref 0–37)
BILIRUBIN TOTAL: 0.3 mg/dL (ref 0.2–1.2)
BUN: 14 mg/dL (ref 6–23)
CO2: 29 mEq/L (ref 19–32)
Calcium: 9.4 mg/dL (ref 8.4–10.5)
Chloride: 105 mEq/L (ref 96–112)
Creatinine, Ser: 0.82 mg/dL (ref 0.40–1.50)
GFR: 100.54 mL/min (ref >60.00)
GLUCOSE: 62 mg/dL — AB (ref 70–99)
POTASSIUM: 4.3 meq/L (ref 3.5–5.1)
SODIUM: 141 meq/L (ref 135–145)
TOTAL PROTEIN: 6.5 g/dL (ref 6.0–8.3)

## 2016-11-12 LAB — CBC
HEMATOCRIT: 43.2 % (ref 39.0–52.0)
HEMOGLOBIN: 14.4 g/dL (ref 13.0–17.0)
MCHC: 33.4 g/dL (ref 30.0–36.0)
MCV: 89.2 fl (ref 78.0–100.0)
Platelets: 196 10*3/uL (ref 150.0–400.0)
RBC: 4.84 Mil/uL (ref 4.22–5.81)
RDW: 13.9 % (ref 11.5–15.5)
WBC: 5.3 10*3/uL (ref 4.0–10.5)

## 2016-11-12 LAB — PSA: PSA: 1.9 ng/mL (ref 0.10–4.00)

## 2016-11-12 LAB — HEMOGLOBIN A1C: HEMOGLOBIN A1C: 6.1 % (ref 4.6–6.5)

## 2016-11-12 MED ORDER — ZOLPIDEM TARTRATE 10 MG PO TABS: 10.0000 mg | ORAL_TABLET | Freq: Every day | ORAL | 2 refills | Status: DC

## 2016-11-12 MED ORDER — AMITRIPTYLINE HCL 25 MG PO TABS: 25.0000 mg | ORAL_TABLET | Freq: Every evening | ORAL | 3 refills | Status: DC | PRN

## 2016-11-12 MED ORDER — ATORVASTATIN CALCIUM 10 MG PO TABS: 10.0000 mg | ORAL_TABLET | Freq: Every day | ORAL | 3 refills | Status: DC

## 2016-11-12 MED ORDER — PAROXETINE HCL 30 MG PO TABS: 30.0000 mg | ORAL_TABLET | Freq: Every day | ORAL | 3 refills | Status: DC

## 2016-11-12 MED ORDER — BUTALBITAL-APAP-CAFFEINE 50-325-40 MG PO TABS: ORAL_TABLET | ORAL | 1 refills | Status: DC

## 2016-11-12 NOTE — Progress Notes (Signed)
[facility] [address], [address] [place], [place] [address] [phone]  Date:  11/12/2016   Name:  Brian [name]   DOB:  [birth year]   MRN:  [id number]  PCP:  Abbe Amsterdam, MD    Chief Complaint: Establish Care (Former pt of Dr. Cleta Alberts. Due for labs. Decline flu vaccines.)   History of Present Illness:  Brian [name] is a 66 y.o. very pleasant male patient who presents with the following:  Last seen in April 2016 by Dr. Cleta Alberts who is retiring.  He is here to establish care and have a CPE today History of dyslipidemia, depression, a fib- pt of Dr. Herbie Baltimore wtih cardiology . He was in a fib with RVR dx in 2/17 when he was sick with viral gastroenteritis.  This was thought due to his GI illness and has not come back.  He did a 48 hour holter at the time that was normal He has a family history of CAD- he is on baby aspirin, lipitor- no personal history of CAD His father had a fib, MI  Last labs in April of 17 He did have a bowl of cereal today but nothing fatty- would like to do labs today  He does not get a lot of formal exercise He is retired from [employer]; he stays active however with lots of different activities He alternates between [place] for chronic insomnia; he does this so he will hopefully not become dependent on [place].  The elavil does work but it will still take 2 + hours for him to fall asleep after using this med He is doing well on paxil and does not want to come off of this drug at this time; he has been on it for a long time and notes that his depression is under excellent control He does use fioricet prn for headache on occasion  Reviewed [facility]-  30 ambien rx on 1/26, 11/28, 10/14, 9/01.  No rx for fioricet noted   Dr. Margo Aye is his dermatologist He declines a flu shot today  He does have hearing aids that are working well for him, he likes having them- just got them a few weeks ago   Patient  Active Problem List   Diagnosis Date Noted  . BMI 25.0-25.9,adult 11/11/2015  . Family history of coronary artery disease in father 10/25/2015  . Paroxysmal atrial fibrillation (HCC) 10/11/2015  . Dyslipidemia 10/10/2015  . Depression     Past Medical History:  Diagnosis Date  . Depression   . Duodenitis 10/10/2015  . PAF (paroxysmal atrial fibrillation) (HCC) Jan 2017   lone AF in setting of sepsis  . Sepsis secondary to UTI (HCC) 10/10/2015    Past Surgical History:  Procedure Laterality Date  . APPENDECTOMY    . CARDIOVASCULAR STRESS TEST  01/22/2014   GXT - Ex 10 min (10.9 METs), HR 164 = 103% MPHR. No ST-T wave chagnes or Sx.  LOW RISK  --[facility] TM Score 10   . VASECTOMY      Social History  Substance Use Topics  . Smoking status: Never Smoker  . Smokeless tobacco: Never Used  . Alcohol use No    Family History  Problem Relation Age of Onset  . Cancer Mother   . Cancer Father   . Heart disease Father   . Stroke Father   . Cancer Maternal Grandmother   . Stroke Maternal Grandfather   . Coronary artery  disease    . Cancer    . Stroke    . Arrhythmia      Allergies  Allergen Reactions  . Sulfa Antibiotics     childhood    Medication list has been reviewed and updated.  Current Outpatient Prescriptions on File Prior to Visit  Medication Sig Dispense Refill  . amitriptyline (ELAVIL) 25 MG tablet Take 1 tablet (25 mg total) by mouth at bedtime as needed for sleep. TAKE ONE TABLET BY MOUTH AT BEDTIME. 30 tablet 11  . aspirin 81 MG tablet Take 81 mg by mouth daily.    Marland Kitchen. atorvastatin (LIPITOR) 10 MG tablet Take 1 tablet (10 mg total) by mouth daily. 30 tablet 11  . butalbital-acetaminophen-caffeine (FIORICET, ESGIC) 50-325-40 MG tablet take 1 tablet by mouth every 6 hours if needed for headache 30 tablet 5  . PARoxetine (PAXIL) 30 MG tablet Take 1 tablet (30 mg total) by mouth daily. 90 tablet 1  . zolpidem (AMBIEN) 10 MG tablet Take 1 tablet (10 mg total) by mouth  at bedtime. Take 1/2 or 1 as needed for sleep 30 tablet 0   No current facility-administered medications on file prior to visit.     Review of Systems:  As per HPI- otherwise negative. No CP or SOB, no fever or chills, no weight change   Physical Examination: Vitals:   11/12/16 0949  BP: 110/84  Pulse: 85  Temp: 97.9 F (36.6 C)   Vitals:   11/12/16 0949  Weight: 167 lb 6.4 oz (75.9 kg)  Height: 5\' 8"  (1.727 m)   Body mass index is 25.45 kg/m. Ideal Body Weight: Weight in (lb) to have BMI = 25: 164.1  GEN: WDWN, NAD, Non-toxic, A & O x 3, normal weight, looks well HEENT: Atraumatic, Normocephalic. Neck supple. No masses, No LAD.  Bilateral TM wnl, oropharynx normal.  PEERL,EOMI.   Ears and Nose: No external deformity. CV: RRR, No M/G/R. No JVD. No thrill. No extra heart sounds. PULM: CTA B, no wheezes, crackles, rhonchi. No retractions. No resp. distress. No accessory muscle use. ABD: S, NT, ND. No rebound. No HSM. EXTR: No c/c/e NEURO Normal gait.  PSYCH: Normally interactive. Conversant. Not depressed or anxious appearing.  Calm demeanor.    Assessment and Plan: Physical exam  Elevated cholesterol - Plan: Lipid panel, atorvastatin (LIPITOR) 10 MG tablet  Insomnia, unspecified type - Plan: amitriptyline (ELAVIL) 25 MG tablet, zolpidem (AMBIEN) 10 MG tablet  Depression, unspecified depression type - Plan: PARoxetine (PAXIL) 30 MG tablet  Bilateral hearing loss, unspecified hearing loss type  Periodic headache syndrome, not intractable  Screening for diabetes mellitus - Plan: Comprehensive metabolic panel, Hemoglobin A1c  Screening for prostate cancer - Plan: PSA  Screening for deficiency anemia - Plan: CBC  Episodic paroxysmal hemicrania, not intractable - Plan: butalbital-acetaminophen-caffeine (FIORICET, ESGIC) 50-325-40 MG tablet  Here today to establish care and have a CPE Labs pending as above for screening and to follow-up his lipids He is doing well  with his new hearing aids Will plan further follow- up pending labs. See patient instructions for more details.     Signed Abbe AmsterdamJessica Darcel Zick, MD

## 2016-11-12 NOTE — Progress Notes (Signed)
Pre visit review using our clinic review tool, if applicable. No additional management support is needed unless otherwise documented below in the visit note. 

## 2016-11-12 NOTE — Patient Instructions (Signed)
It was very nice to see you today- take care and I will be in touch with your labs asap We will check your cholesterol, PSA, blood sugar I refilled your medications today; continue to use the ambien with care, do not use it with any other sedating medications or alcohol and never drive after taking it

## 2017-01-05 ENCOUNTER — Other Ambulatory Visit: Payer: Self-pay | Admitting: Family Medicine

## 2017-01-05 DIAGNOSIS — G44039 Episodic paroxysmal hemicrania, not intractable: Secondary | ICD-10-CM

## 2017-01-09 ENCOUNTER — Other Ambulatory Visit: Payer: Self-pay | Admitting: Family Medicine

## 2017-03-01 ENCOUNTER — Other Ambulatory Visit: Payer: Self-pay | Admitting: Family Medicine

## 2017-03-01 DIAGNOSIS — G4700 Insomnia, unspecified: Secondary | ICD-10-CM

## 2017-03-01 DIAGNOSIS — G44039 Episodic paroxysmal hemicrania, not intractable: Secondary | ICD-10-CM

## 2017-03-01 MED ORDER — ZOLPIDEM TARTRATE 10 MG PO TABS: 10.0000 mg | ORAL_TABLET | Freq: Every day | ORAL | 2 refills | Status: DC

## 2017-03-04 ENCOUNTER — Other Ambulatory Visit: Payer: Self-pay

## 2017-03-04 DIAGNOSIS — G4700 Insomnia, unspecified: Secondary | ICD-10-CM

## 2017-03-04 MED ORDER — ZOLPIDEM TARTRATE 10 MG PO TABS: 10.0000 mg | ORAL_TABLET | Freq: Every day | ORAL | 2 refills | Status: DC

## 2017-03-17 ENCOUNTER — Other Ambulatory Visit: Payer: Self-pay | Admitting: Family Medicine

## 2017-03-17 DIAGNOSIS — G44039 Episodic paroxysmal hemicrania, not intractable: Secondary | ICD-10-CM

## 2017-03-29 ENCOUNTER — Other Ambulatory Visit: Payer: Self-pay | Admitting: Family Medicine

## 2017-03-29 DIAGNOSIS — G44039 Episodic paroxysmal hemicrania, not intractable: Secondary | ICD-10-CM

## 2017-04-09 ENCOUNTER — Other Ambulatory Visit: Payer: Self-pay

## 2017-04-09 DIAGNOSIS — G44039 Episodic paroxysmal hemicrania, not intractable: Secondary | ICD-10-CM

## 2017-04-09 MED ORDER — BUTALBITAL-APAP-CAFFEINE 50-325-40 MG PO TABS: ORAL_TABLET | ORAL | 1 refills | Status: DC

## 2017-04-18 ENCOUNTER — Telehealth: Payer: Self-pay | Admitting: Family Medicine

## 2017-04-18 NOTE — Telephone Encounter (Signed)
fiorcet rx was phoned in last week in my absence, but per pt this did not appear at the pharmacy I called this in again for him

## 2017-05-31 ENCOUNTER — Other Ambulatory Visit: Payer: Self-pay | Admitting: Emergency Medicine

## 2017-05-31 DIAGNOSIS — G44039 Episodic paroxysmal hemicrania, not intractable: Secondary | ICD-10-CM

## 2017-05-31 MED ORDER — BUTALBITAL-APAP-CAFFEINE 50-325-40 MG PO TABS: ORAL_TABLET | ORAL | 0 refills | Status: DC

## 2017-05-31 NOTE — Telephone Encounter (Signed)
Received refill request for butalbital-acetaminophen-caffeine (FIORICET, ESGIC) 50- 325- 40 MG tablet. Last office visit : 11/12/16, and last refill:  04/09/17.

## 2017-07-16 ENCOUNTER — Other Ambulatory Visit: Payer: Self-pay | Admitting: Family Medicine

## 2017-07-16 DIAGNOSIS — G44039 Episodic paroxysmal hemicrania, not intractable: Secondary | ICD-10-CM

## 2017-07-20 ENCOUNTER — Other Ambulatory Visit: Payer: Self-pay | Admitting: Family Medicine

## 2017-07-20 DIAGNOSIS — G4700 Insomnia, unspecified: Secondary | ICD-10-CM

## 2017-07-23 ENCOUNTER — Other Ambulatory Visit: Payer: Self-pay | Admitting: Emergency Medicine

## 2017-07-23 DIAGNOSIS — G4700 Insomnia, unspecified: Secondary | ICD-10-CM

## 2017-07-23 MED ORDER — ZOLPIDEM TARTRATE 10 MG PO TABS: 10.0000 mg | ORAL_TABLET | Freq: Every day | ORAL | 2 refills | Status: DC

## 2017-08-07 ENCOUNTER — Encounter: Payer: Self-pay | Admitting: Family Medicine

## 2017-08-07 ENCOUNTER — Other Ambulatory Visit: Payer: Self-pay | Admitting: Family Medicine

## 2017-08-07 DIAGNOSIS — G44039 Episodic paroxysmal hemicrania, not intractable: Secondary | ICD-10-CM

## 2017-08-10 MED ORDER — BUTALBITAL-APAP-CAFFEINE 50-325-40 MG PO TABS: ORAL_TABLET | ORAL | 0 refills | Status: DC

## 2017-10-07 ENCOUNTER — Other Ambulatory Visit: Payer: Self-pay | Admitting: Family Medicine

## 2017-10-07 DIAGNOSIS — G44039 Episodic paroxysmal hemicrania, not intractable: Secondary | ICD-10-CM

## 2017-10-08 ENCOUNTER — Other Ambulatory Visit: Payer: Self-pay | Admitting: Family Medicine

## 2017-10-08 DIAGNOSIS — G44039 Episodic paroxysmal hemicrania, not intractable: Secondary | ICD-10-CM

## 2017-10-08 MED ORDER — BUTALBITAL-APAP-CAFFEINE 50-325-40 MG PO TABS: ORAL_TABLET | ORAL | 1 refills | Status: DC

## 2017-10-12 ENCOUNTER — Other Ambulatory Visit: Payer: Self-pay | Admitting: Family Medicine

## 2017-10-12 DIAGNOSIS — G44039 Episodic paroxysmal hemicrania, not intractable: Secondary | ICD-10-CM

## 2017-10-17 ENCOUNTER — Other Ambulatory Visit: Payer: Self-pay | Admitting: Family Medicine

## 2017-10-17 DIAGNOSIS — G44039 Episodic paroxysmal hemicrania, not intractable: Secondary | ICD-10-CM

## 2017-10-17 MED ORDER — BUTALBITAL-APAP-CAFFEINE 50-325-40 MG PO TABS: ORAL_TABLET | ORAL | 1 refills | Status: DC

## 2017-11-05 ENCOUNTER — Encounter: Payer: Self-pay | Admitting: Family Medicine

## 2017-11-10 NOTE — Progress Notes (Addendum)
[facility] [address], [address] [place], [place] [address] [phone]  Date:  11/18/2017   Name:  Brian [name]   DOB:  [birth year]   MRN:  [id number]  PCP:  Brian Cables, MD    Chief Complaint: Annual Exam   History of Present Illness:  Brian [name] is a 67 y.o. very pleasant male patient who presents with the following:  Here today for a CPE/ welcome to medicare History of a fib, hyperlipidemia, depression Last seen by myself about a year ago  Last seen in April 2016 by Dr. Cleta Alberts who is retiring.  He is here to establish care and have a CPE today History of dyslipidemia, depression, a fib- pt of Dr. Herbie Baltimore wtih cardiology . He was in a fib with RVR dx in 2/17 when he was sick with viral gastroenteritis.  This was thought due to his GI illness and has not come back.  He did a 48 hour holter at the time that was normal He has a family history of CAD- he is on baby aspirin, lipitor- no personal history of CAD His father had a fib, MI He does not get a lot of formal exercise He is retired from the police dept; he stays active however with lots of different activities He alternates between [place] for chronic insomnia; he does this so he will hopefully not become dependent on [place].  The elavil does work but it will still take 2 + hours for him to fall asleep after using this med He is doing well on paxil and does not want to come off of this drug at this time; he has been on it for a long time and notes that his depression is under excellent control He does use fioricet prn for headache on occasion Reviewed [facility]-  30 ambien rx on 1/26, 11/28, 10/14, 9/01.  No rx for fioricet noted  Dr. Margo Aye is his dermatologist He does have hearing aids that are working well for him, he likes having them- just got them a few weeks ago  Flu: he declines this  Labs due today prevnar 13- will do today shingrix- discussed  with him, he plans to do at drug store  Vision screening: done EKG: 2/17 End of life planning:  He is taking care of his grands two days of the week, building furniture, helping at church and doing boy scounts His grand-daughter is 17 yo and grand-son is 26 yo  Sleeping is ok-  He uses elavil most nights- 50 mg He uses ambien on rare occasion- he can only get 90 of these per year anyway with his insurance so he does not take them often  He may take a fiorcet twice a week for his headaches. He has had headaches for years and years- will feel them in his temples.  They have been more frequent since the fall, maybe occuring twice a week instead of twice a week as in the past No change in the character of his HA except for them being more frequent HA are a family trait- experienced by many other family members He does not have any associated vision or hearing change  He does not have any other neurological concerns  He has never had any more sx of a fib that he is aware of - this occurred once in 2017 when he was sick  No CP or SOB  No falls in the  last year He feels that he is doing well with paxil - mood is good, he has been on this for years   He is married to Brian [name] and she is doing well  He loves his hearing aids Patient Active Problem List   Diagnosis Date Noted  . BMI 25.0-25.9,adult 11/11/2015  . Family history of coronary artery disease in father 10/25/2015  . Paroxysmal atrial fibrillation (HCC) 10/11/2015  . Dyslipidemia 10/10/2015  . Depression     Past Medical History:  Diagnosis Date  . Depression   . Duodenitis 10/10/2015  . PAF (paroxysmal atrial fibrillation) (HCC) Jan 2017   lone AF in setting of sepsis  . Sepsis secondary to UTI (HCC) 10/10/2015    Past Surgical History:  Procedure Laterality Date  . APPENDECTOMY    . CARDIOVASCULAR STRESS TEST  01/22/2014   GXT - Ex 10 min (10.9 METs), HR 164 = 103% MPHR. No ST-T wave chagnes or Sx.  LOW RISK  --[facility] TM  Score 10   . VASECTOMY      Social History   Tobacco Use  . Smoking status: Never Smoker  . Smokeless tobacco: Never Used  Substance Use Topics  . Alcohol use: No    Alcohol/week: 0.0 oz  . Drug use: No    Family History  Problem Relation Age of Onset  . Cancer Mother   . Cancer Father   . Heart disease Father   . Stroke Father   . Cancer Maternal Grandmother   . Stroke Maternal Grandfather   . Coronary artery disease Unknown   . Cancer Unknown   . Stroke Unknown   . Arrhythmia Unknown     Allergies  Allergen Reactions  . Sulfa Antibiotics     childhood    Medication list has been reviewed and updated.  Current Outpatient Medications on File Prior to Visit  Medication Sig Dispense Refill  . aspirin 81 MG tablet Take 81 mg by mouth daily.    . butalbital-acetaminophen-caffeine (FIORICET, ESGIC) 50-325-40 MG tablet TAKE 1 TABLET BY MOUTH EVERY 6 HOURS AS NEEDED FOR HEADACHE. DO not TAKE WITH additional TYLENOL TABLETS 30 tablet 1  . Multiple Vitamin (MULTIVITAMIN) tablet Take 1 tablet by mouth daily.    Marland Kitchen zolpidem (AMBIEN) 10 MG tablet Take 1 tablet (10 mg total) at bedtime by mouth. Take 1/2 or 1 as needed for sleep 30 tablet 2   No current facility-administered medications on file prior to visit.     Review of Systems:  As per HPI- otherwise negative.   Physical Examination: Vitals:   11/18/17 0821  BP: 116/82  Pulse: 96  Resp: 16  Temp: 98.1 F (36.7 C)  SpO2: 96%   Vitals:   11/18/17 0821  Weight: 170 lb 3.2 oz (77.2 kg)  Height: 5\' 8"  (1.727 m)   Body mass index is 25.88 kg/m. Ideal Body Weight: Weight in (lb) to have BMI = 25: 164.1  GEN: WDWN, NAD, Non-toxic, A & O x 3 HEENT: Atraumatic, Normocephalic. Neck supple. No masses, No LAD. Ears and Nose: No external deformity. CV: RRR, No M/G/R. No JVD. No thrill. No extra heart sounds. PULM: CTA B, no wheezes, crackles, rhonchi. No retractions. No resp. distress. No accessory muscle  use. ABD: S, NT, ND, +BS. No rebound. No HSM. EXTR: No c/c/e NEURO Normal gait.  PSYCH: Normally interactive. Conversant. Not depressed or anxious appearing.  Calm demeanor.    Assessment and Plan: Welcome to Medicare preventive visit  Screening for  prostate cancer - Plan: PSA, Medicare  Dyslipidemia - Plan: Lipid panel  Medication monitoring encounter - Plan: CBC, Comprehensive metabolic panel  Insomnia, unspecified type - Plan: amitriptyline (ELAVIL) 25 MG tablet  Elevated cholesterol - Plan: atorvastatin (LIPITOR) 10 MG tablet  Depression, unspecified depression type - Plan: PARoxetine (PAXIL) 30 MG tablet  Immunization due - Plan: Pneumococcal conjugate vaccine 13-valent IM  Received his labs, message to pt  Your blood count is normal, metabolic profile is normal, cholesterol looks great, continue lipitor  Your PSA is quite stable over the last 2 years which is always reassuring!   Overall looking great- take care and we can visit in one year unless you need anything sooner  Results for orders placed or performed in visit on 11/18/17  CBC  Result Value Ref Range   WBC 5.7 4.0 - 10.5 K/uL   RBC 4.86 4.22 - 5.81 Mil/uL   Platelets 177.0 150.0 - 400.0 K/uL   Hemoglobin 15.0 13.0 - 17.0 g/dL   HCT 16.1 09.6 - 04.5 %   MCV 89.7 78.0 - 100.0 fl   MCHC 34.4 30.0 - 36.0 g/dL   RDW 40.9 81.1 - 91.4 %  Comprehensive metabolic panel  Result Value Ref Range   Sodium 139 135 - 145 mEq/L   Potassium 4.4 3.5 - 5.1 mEq/L   Chloride 103 96 - 112 mEq/L   CO2 30 19 - 32 mEq/L   Glucose, Bld 87 70 - 99 mg/dL   BUN 19 6 - 23 mg/dL   Creatinine, Ser 7.82 0.40 - 1.50 mg/dL   Total Bilirubin 0.4 0.2 - 1.2 mg/dL   Alkaline Phosphatase 115 39 - 117 U/L   AST 18 0 - 37 U/L   ALT 15 0 - 53 U/L   Total Protein 6.9 6.0 - 8.3 g/dL   Albumin 4.0 3.5 - 5.2 g/dL   Calcium 9.7 8.4 - 95.6 mg/dL   GFR 21.30 >86.57 mL/min  Lipid panel  Result Value Ref Range   Cholesterol 169 0 - 200 mg/dL    Triglycerides 84.6 0.0 - 149.0 mg/dL   HDL 96.29 >52.84 mg/dL   VLDL 13.2 0.0 - 44.0 mg/dL   LDL Cholesterol 96 0 - 99 mg/dL   Total CHOL/HDL Ratio 3    NonHDL 112.28   PSA, Medicare  Result Value Ref Range   PSA 2.03 0.10 - 4.00 ng/ml     Signed Abbe Amsterdam, MD    Subjective:   Brian [name] is a 67 y.o. male who presents for a Welcome to Medicare exam.   Review of Systems: No chest pain or SOB No urinary or genital concerns No ED No rashes Energy level is good Chronic insomnia is controlled with elavil  Weight is stable, perhaps up a couple of lbs   Wt Readings from Last 3 Encounters:  11/18/17 170 lb 3.2 oz (77.2 kg)  11/12/16 167 lb 6.4 oz (75.9 kg)  01/05/16 165 lb (74.8 kg)          Objective:    Today's Vitals   11/18/17 0821  BP: 116/82  Pulse: 96  Resp: 16  Temp: 98.1 F (36.7 C)  TempSrc: Oral  SpO2: 96%  Weight: 170 lb 3.2 oz (77.2 kg)  Height: 5\' 8"  (1.727 m)   Body mass index is 25.88 kg/m.  Medications Outpatient Encounter Medications as of 11/18/2017  Medication Sig  . amitriptyline (ELAVIL) 25 MG tablet Take 2 tablets (50 mg total) by mouth at bedtime as  needed for sleep.  Marland Kitchen aspirin 81 MG tablet Take 81 mg by mouth daily.  Marland Kitchen atorvastatin (LIPITOR) 10 MG tablet Take 1 tablet (10 mg total) by mouth daily.  . butalbital-acetaminophen-caffeine (FIORICET, ESGIC) 50-325-40 MG tablet TAKE 1 TABLET BY MOUTH EVERY 6 HOURS AS NEEDED FOR HEADACHE. DO not TAKE WITH additional TYLENOL TABLETS  . Multiple Vitamin (MULTIVITAMIN) tablet Take 1 tablet by mouth daily.  Marland Kitchen PARoxetine (PAXIL) 30 MG tablet Take 1 tablet (30 mg total) by mouth daily.  Marland Kitchen zolpidem (AMBIEN) 10 MG tablet Take 1 tablet (10 mg total) at bedtime by mouth. Take 1/2 or 1 as needed for sleep  . [DISCONTINUED] amitriptyline (ELAVIL) 25 MG tablet Take 1 tablet (25 mg total) by mouth at bedtime as needed for sleep. (Patient taking differently: Take 50 mg by mouth at bedtime as  needed for sleep. )  . [DISCONTINUED] atorvastatin (LIPITOR) 10 MG tablet Take 1 tablet (10 mg total) by mouth daily.  . [DISCONTINUED] PARoxetine (PAXIL) 30 MG tablet Take 1 tablet (30 mg total) by mouth daily.   No facility-administered encounter medications on file as of 11/18/2017.      History: Past Medical History:  Diagnosis Date  . Depression   . Duodenitis 10/10/2015  . PAF (paroxysmal atrial fibrillation) (HCC) Jan 2017   lone AF in setting of sepsis  . Sepsis secondary to UTI (HCC) 10/10/2015   Past Surgical History:  Procedure Laterality Date  . APPENDECTOMY    . CARDIOVASCULAR STRESS TEST  01/22/2014   GXT - Ex 10 min (10.9 METs), HR 164 = 103% MPHR. No ST-T wave chagnes or Sx.  LOW RISK  --[facility] TM Score 10   . VASECTOMY      Family History  Problem Relation Age of Onset  . Cancer Mother   . Cancer Father   . Heart disease Father   . Stroke Father   . Cancer Maternal Grandmother   . Stroke Maternal Grandfather   . Coronary artery disease Unknown   . Cancer Unknown   . Stroke Unknown   . Arrhythmia Unknown    Social History   Occupational History  . Occupation: retired bus Psychiatric nurse  . Smoking status: Never Smoker  . Smokeless tobacco: Never Used  Substance and Sexual Activity  . Alcohol use: No    Alcohol/week: 0.0 oz  . Drug use: No  . Sexual activity: Yes    Partners: Female   Tobacco Counseling Counseling given: Not Answered   Immunizations and Health Maintenance Immunization History  Administered Date(s) Administered  . Influenza Split 06/08/2010, 07/18/2012  . Tdap 06/08/2010  . Zoster 02/26/2013   Health Maintenance Due  Topic Date Due  . HIV Screening  11/20/1965    Activities of Daily Living No flowsheet data found.  Physical Exam  GEN: WDWN, NAD, Non-toxic, A & O x 3 HEENT: Atraumatic, Normocephalic. Neck supple. No masses, No LAD. Ears and Nose: No external deformity. CV: RRR, No M/G/R. No JVD. No thrill. No extra  heart sounds. PULM: CTA B, no wheezes, crackles, rhonchi. No retractions. No resp. distress. No accessory muscle use. ABD: S, NT, ND, +BS. No rebound. No HSM. EXTR: No c/c/e NEURO Normal gait.  PSYCH: Normally interactive. Conversant. Not depressed or anxious appearing.  Calm demeanor.    Advanced Directives: discussed with pt.  He reports that both he and his wife have this taken care of with their family lawyer and have discussed with family members  Assessment:    This is a routine wellness  examination for this patient .   Vision/Hearing screen  Visual Acuity Screening   Right eye Left eye Both eyes  Without correction:     With correction: 20/20 20/20 20/20   Hearing Screening Comments: 358ft both ears whisper test passed   Dietary issues and exercise activities discussed:  "stay alive to see you next spring!" pt jokes.  He is feeling well, he does not have any other concerns really     Goals    None      Depression Screen PHQ 2/9 Scores 11/18/2017 11/11/2015 12/24/2014 12/22/2013  PHQ - 2 Score 0 0 0 0     Fall Risk Fall Risk  11/18/2017  Falls in the past year? No    Cognitive Function    pt has no concerns about his memory or cognitive function.  He is fully independent in his daily activities and is active in the community as well     Patient Care Team: Copland, Gwenlyn FoundJessica C, MD as PCP - General (Family Medicine) Bernette RedbirdBuccini, Robert, MD (Gastroenterology)     Plan:    Welcome to Medicare preventive visit  Screening for prostate cancer - Plan: PSA, Medicare  Dyslipidemia - Plan: Lipid panel  Medication monitoring encounter - Plan: CBC, Comprehensive metabolic panel  Insomnia, unspecified type - Plan: amitriptyline (ELAVIL) 25 MG tablet  Elevated cholesterol - Plan: atorvastatin (LIPITOR) 10 MG tablet  Depression, unspecified depression type - Plan: PARoxetine (PAXIL) 30 MG tablet  Immunization due - Plan: Pneumococcal conjugate vaccine 13-valent IM    I  have personally reviewed and noted the following in the patient's chart:   . Medical and social history . Use of alcohol, tobacco or illicit drugs  . Current medications and supplements . Functional ability and status . Nutritional status . Physical activity . Advanced directives . List of other physicians . Hospitalizations, surgeries, and ER visits in previous 12 months . Vitals . Screenings to include cognitive, depression, and falls . Referrals and appointments  In addition, I have reviewed and discussed with patient certain preventive protocols, quality metrics, and best practice recommendations. A written personalized care plan for preventive services as well as general preventive health recommendations were provided to patient.    He has discussed his end of life wishes with his wife and children   Abbe AmsterdamJessica Copland, MD 11/18/2017

## 2017-11-14 ENCOUNTER — Encounter: Payer: 59 | Admitting: Family Medicine

## 2017-11-18 ENCOUNTER — Encounter: Payer: Self-pay | Admitting: Family Medicine

## 2017-11-18 ENCOUNTER — Ambulatory Visit (INDEPENDENT_AMBULATORY_CARE_PROVIDER_SITE_OTHER): Payer: Medicare Other | Admitting: Family Medicine

## 2017-11-18 VITALS — BP 116/82 | HR 96 | Temp 98.1°F | Resp 16 | Ht 68.0 in | Wt 170.2 lb

## 2017-11-18 DIAGNOSIS — F329 Major depressive disorder, single episode, unspecified: Secondary | ICD-10-CM

## 2017-11-18 DIAGNOSIS — Z23 Encounter for immunization: Secondary | ICD-10-CM | POA: Diagnosis not present

## 2017-11-18 DIAGNOSIS — E7800 Pure hypercholesterolemia, unspecified: Secondary | ICD-10-CM

## 2017-11-18 DIAGNOSIS — Z125 Encounter for screening for malignant neoplasm of prostate: Secondary | ICD-10-CM | POA: Diagnosis not present

## 2017-11-18 DIAGNOSIS — Z0000 Encounter for general adult medical examination without abnormal findings: Secondary | ICD-10-CM | POA: Diagnosis not present

## 2017-11-18 DIAGNOSIS — E785 Hyperlipidemia, unspecified: Secondary | ICD-10-CM | POA: Diagnosis not present

## 2017-11-18 DIAGNOSIS — G4700 Insomnia, unspecified: Secondary | ICD-10-CM

## 2017-11-18 DIAGNOSIS — Z5181 Encounter for therapeutic drug level monitoring: Secondary | ICD-10-CM

## 2017-11-18 DIAGNOSIS — F32A Depression, unspecified: Secondary | ICD-10-CM

## 2017-11-18 LAB — COMPREHENSIVE METABOLIC PANEL
ALK PHOS: 115 U/L (ref 39–117)
ALT: 15 U/L (ref 0–53)
AST: 18 U/L (ref 0–37)
Albumin: 4 g/dL (ref 3.5–5.2)
BUN: 19 mg/dL (ref 6–23)
CHLORIDE: 103 meq/L (ref 96–112)
CO2: 30 mEq/L (ref 19–32)
Calcium: 9.7 mg/dL (ref 8.4–10.5)
Creatinine, Ser: 0.91 mg/dL (ref 0.40–1.50)
GFR: 88.87 mL/min (ref >60.00)
GLUCOSE: 87 mg/dL (ref 70–99)
POTASSIUM: 4.4 meq/L (ref 3.5–5.1)
SODIUM: 139 meq/L (ref 135–145)
TOTAL PROTEIN: 6.9 g/dL (ref 6.0–8.3)
Total Bilirubin: 0.4 mg/dL (ref 0.2–1.2)

## 2017-11-18 LAB — CBC
HEMATOCRIT: 43.6 % (ref 39.0–52.0)
HEMOGLOBIN: 15 g/dL (ref 13.0–17.0)
MCHC: 34.4 g/dL (ref 30.0–36.0)
MCV: 89.7 fl (ref 78.0–100.0)
Platelets: 177 10*3/uL (ref 150.0–400.0)
RBC: 4.86 Mil/uL (ref 4.22–5.81)
RDW: 13.5 % (ref 11.5–15.5)
WBC: 5.7 10*3/uL (ref 4.0–10.5)

## 2017-11-18 LAB — PSA, MEDICARE: PSA: 2.03 ng/ml (ref 0.10–4.00)

## 2017-11-18 LAB — LIPID PANEL
CHOLESTEROL: 169 mg/dL (ref 0–200)
HDL: 56.5 mg/dL (ref >39.00)
LDL CALC: 96 mg/dL (ref 0–99)
NONHDL: 112.28
Total CHOL/HDL Ratio: 3
Triglycerides: 81 mg/dL (ref 0.0–149.0)
VLDL: 16.2 mg/dL (ref 0.0–40.0)

## 2017-11-18 MED ORDER — AMITRIPTYLINE HCL 25 MG PO TABS: 50.0000 mg | ORAL_TABLET | Freq: Every evening | ORAL | 3 refills | Status: DC | PRN

## 2017-11-18 MED ORDER — ATORVASTATIN CALCIUM 10 MG PO TABS: 10.0000 mg | ORAL_TABLET | Freq: Every day | ORAL | 3 refills | Status: DC

## 2017-11-18 MED ORDER — PAROXETINE HCL 30 MG PO TABS: 30.0000 mg | ORAL_TABLET | Freq: Every day | ORAL | 3 refills | Status: DC

## 2017-11-18 NOTE — Patient Instructions (Addendum)
Always a pleasure to see you in the office!   I will be in touch with your labs asap Please think about getting the shingrix vaccine series at the drug store at your convenience We will do the 2nd pneumonia vaccine in a year    Health Maintenance, Male A healthy lifestyle and preventive care is important for your health and wellness. Ask your health care provider about what schedule of regular examinations is right for you. What should I know about weight and diet? Eat a Healthy Diet  Eat plenty of vegetables, fruits, whole grains, low-fat dairy products, and lean protein.  Do not eat a lot of foods high in solid fats, added sugars, or salt.  Maintain a Healthy Weight Regular exercise can help you achieve or maintain a healthy weight. You should:  Do at least 150 minutes of exercise each week. The exercise should increase your heart rate and make you sweat (moderate-intensity exercise).  Do strength-training exercises at least twice a week.  Watch Your Levels of Cholesterol and Blood Lipids  Have your blood tested for lipids and cholesterol every 5 years starting at 65 years of age. If you are at high risk for heart disease, you should start having your blood tested when you are 65 years old. You may need to have your cholesterol levels checked more often if: ? Your lipid or cholesterol levels are high. ? You are older than 65 years of age. ? You are at high risk for heart disease.  What should I know about cancer screening? Many types of cancers can be detected early and may often be prevented. Lung Cancer  You should be screened every year for lung cancer if: ? You are a current smoker who has smoked for at least 30 years. ? You are a former smoker who has quit within the past 15 years.  Talk to your health care provider about your screening options, when you should start screening, and how often you should be screened.  Colorectal Cancer  Routine colorectal cancer screening  usually begins at 65 years of age and should be repeated every 5-10 years until you are 65 years old. You may need to be screened more often if early forms of precancerous polyps or small growths are found. Your health care provider may recommend screening at an earlier age if you have risk factors for colon cancer.  Your health care provider may recommend using home test kits to check for hidden blood in the stool.  A small camera at the end of a tube can be used to examine your colon (sigmoidoscopy or colonoscopy). This checks for the earliest forms of colorectal cancer.  Prostate and Testicular Cancer  Depending on your age and overall health, your health care provider may do certain tests to screen for prostate and testicular cancer.  Talk to your health care provider about any symptoms or concerns you have about testicular or prostate cancer.  Skin Cancer  Check your skin from head to toe regularly.  Tell your health care provider about any new moles or changes in moles, especially if: ? There is a change in a mole's size, shape, or color. ? You have a mole that is larger than a pencil eraser.  Always use sunscreen. Apply sunscreen liberally and repeat throughout the day.  Protect yourself by wearing long sleeves, pants, a wide-brimmed hat, and sunglasses when outside.  What should I know about heart disease, diabetes, and high blood pressure?  If you  are 47-51 years of age, have your blood pressure checked every 3-5 years. If you are 17 years of age or older, have your blood pressure checked every year. You should have your blood pressure measured twice-once when you are at a hospital or clinic, and once when you are not at a hospital or clinic. Record the average of the two measurements. To check your blood pressure when you are not at a hospital or clinic, you can use: ? An automated blood pressure machine at a pharmacy. ? A home blood pressure monitor.  Talk to your health care  provider about your target blood pressure.  If you are between 83-58 years old, ask your health care provider if you should take aspirin to prevent heart disease.  Have regular diabetes screenings by checking your fasting blood sugar level. ? If you are at a normal weight and have a low risk for diabetes, have this test once every three years after the age of 29. ? If you are overweight and have a high risk for diabetes, consider being tested at a younger age or more often.  A one-time screening for abdominal aortic aneurysm (AAA) by ultrasound is recommended for men aged 53-75 years who are current or former smokers. What should I know about preventing infection? Hepatitis B If you have a higher risk for hepatitis B, you should be screened for this virus. Talk with your health care provider to find out if you are at risk for hepatitis B infection. Hepatitis C Blood testing is recommended for:  Everyone born from [birth year] through 1963.  Anyone with known risk factors for hepatitis C.  Sexually Transmitted Diseases (STDs)  You should be screened each year for STDs including gonorrhea and chlamydia if: ? You are sexually active and are younger than 65 years of age. ? You are older than 65 years of age and your health care provider tells you that you are at risk for this type of infection. ? Your sexual activity has changed since you were last screened and you are at an increased risk for chlamydia or gonorrhea. Ask your health care provider if you are at risk.  Talk with your health care provider about whether you are at high risk of being infected with HIV. Your health care provider may recommend a prescription medicine to help prevent HIV infection.  What else can I do?  Schedule regular health, dental, and eye exams.  Stay current with your vaccines (immunizations).  Do not use any tobacco products, such as cigarettes, chewing tobacco, and e-cigarettes. If you need help quitting, ask  your health care provider.  Limit alcohol intake to no more than 2 drinks per day. One drink equals 12 ounces of beer, 5 ounces of wine, or 1 ounces of hard liquor.  Do not use street drugs.  Do not share needles.  Ask your health care provider for help if you need support or information about quitting drugs.  Tell your health care provider if you often feel depressed.  Tell your health care provider if you have ever been abused or do not feel safe at home. This information is not intended to replace advice given to you by your health care provider. Make sure you discuss any questions you have with your health care provider. Document Released: 03/01/2008 Document Revised: 05/02/2016 Document Reviewed: 06/07/2015 Elsevier Interactive Patient Education  [employer].

## 2017-12-25 ENCOUNTER — Other Ambulatory Visit: Payer: Self-pay | Admitting: Family Medicine

## 2017-12-25 DIAGNOSIS — G44039 Episodic paroxysmal hemicrania, not intractable: Secondary | ICD-10-CM

## 2017-12-27 ENCOUNTER — Other Ambulatory Visit: Payer: Self-pay | Admitting: Emergency Medicine

## 2017-12-27 DIAGNOSIS — G44039 Episodic paroxysmal hemicrania, not intractable: Secondary | ICD-10-CM

## 2017-12-27 NOTE — Telephone Encounter (Signed)
Received refill request for butalbital-acetaminophen-caffeine (FIORICET, ESGIC) 50-325-40 MG tablet.

## 2017-12-28 ENCOUNTER — Other Ambulatory Visit: Payer: Self-pay | Admitting: Family Medicine

## 2017-12-28 DIAGNOSIS — G44039 Episodic paroxysmal hemicrania, not intractable: Secondary | ICD-10-CM

## 2018-03-06 ENCOUNTER — Other Ambulatory Visit: Payer: Self-pay | Admitting: Family Medicine

## 2018-03-06 DIAGNOSIS — G44039 Episodic paroxysmal hemicrania, not intractable: Secondary | ICD-10-CM

## 2018-03-12 ENCOUNTER — Other Ambulatory Visit: Payer: Self-pay | Admitting: Family Medicine

## 2018-03-12 ENCOUNTER — Telehealth: Payer: Self-pay

## 2018-03-12 DIAGNOSIS — G44039 Episodic paroxysmal hemicrania, not intractable: Secondary | ICD-10-CM

## 2018-03-12 MED ORDER — BUTALBITAL-APAP-CAFFEINE 50-325-40 MG PO TABS: ORAL_TABLET | ORAL | 1 refills | Status: DC

## 2018-03-12 NOTE — Telephone Encounter (Signed)
PA initiated via Covermymeds; KEY: [id number]. Awaiting determination.

## 2018-03-13 ENCOUNTER — Encounter: Payer: Self-pay | Admitting: Family Medicine

## 2018-03-13 NOTE — Telephone Encounter (Signed)
PA denied. Pt must first try two of the following:   Cambia Ketoprofen or ketoprofen ER Nalfon (fenoprofen) or Profeno  OR MD must give medical reason why two of the covered drugs are not appropriate for Pt

## 2018-03-13 NOTE — Telephone Encounter (Signed)
Sent message to pt about this- ?does he want to just pay cash

## 2018-05-01 ENCOUNTER — Other Ambulatory Visit: Payer: Self-pay | Admitting: Family Medicine

## 2018-05-01 DIAGNOSIS — G44039 Episodic paroxysmal hemicrania, not intractable: Secondary | ICD-10-CM

## 2018-05-02 ENCOUNTER — Encounter: Payer: Self-pay | Admitting: Family Medicine

## 2018-05-02 NOTE — Telephone Encounter (Signed)
Requesting:Fioricet Contract:none, needs csc ZHY:[id number]:none, needs uds Last Visit:11/18/17 Next Visit:none Last Refill:03/12/18 1 refill  Please Advise

## 2018-11-03 ENCOUNTER — Other Ambulatory Visit: Payer: Self-pay | Admitting: Family Medicine

## 2018-11-03 DIAGNOSIS — E7800 Pure hypercholesterolemia, unspecified: Secondary | ICD-10-CM

## 2018-11-03 DIAGNOSIS — G4700 Insomnia, unspecified: Secondary | ICD-10-CM

## 2018-11-11 NOTE — Progress Notes (Addendum)
[facility] [address], [address] [place], [place] [address] [phone]  Date:  11/24/2018   Name:  Brian [name]   DOB:  25-Mar-1951   MRN:  [id number]  PCP:  [name], MD    Chief Complaint: No chief complaint on file.   History of Present Illness:  Brian [name] is a 68 y.o. very pleasant male patient who presents with the following:  Here today for physical exam History of paroxysmal atrial fibrillation, dyslipidemia, depression He has never been a smoker Married to Brian [name], who is also my patient I saw him last just about a year ago He is retired from the police department, but remains quite active.  They help take care of grandchildren, enjoys building furniture, helping at church and with [employer] activities. He has 2 school-age grandchildren, one granddaughter and one grandson They are now 70 and 18 yo They recently went to [facility] as a family and had a wonderful time  He had an episode of atrial fibrillation in 2017 when he was sick-during admission for viral duodenitis He takes baby aspirin, no other blood thinners at this time  There is a family history of cardiovascular disease.  It looks like his most recent exercise tolerance test was in 2015, can offer a repeat if he would like He is physiclaly active. Joined a gyn this year. He is walking on a treadmill a couple of times a week for 20- 25 minutes with no CP or SOB He does have some pain in his hands due to arthritis.  Right now he is using Aleve.  I suggested that he try Tylenol instead, as this may be safer for his stomach  Labs: Due today Immunizations: Can give Pneumovax, suggest Shingrix if not done already Colon cancer screening: Colonoscopy in 2016, he was told to come back in 10 years   Derm removed a couple of nodules from his skin recently but they were benign  He uses fioticet on occasion for his headaches and got a letter from his insurance  company, stating that this medication was not recommended for persons 65 and over.  He uses on a rare occasion, and we both feel comfortable with his occasional use.  He goes to [facility] audiology for a routine hearing eval- he needs a referral for same  He uses ambien and amitriptyline as needed for insomnia  He uses ambien just on occasion-mostly relies on amitriptyline. He has been on both Paxil and amitriptyline for several years.  Discussed potential for drug interaction, but he has used safely for years as previously stated.  07/20/2017  2   03/04/2017  Zolpidem Tartrate 10 MG Tablet  30.00 30 Je Cop   [id number]   Fri (6540)   1  0.50 LME  Comm Ins   [place]  05/17/2017  2   03/04/2017  Zolpidem Tartrate 10 MG Tablet  30.00 30 Je Cop   [id number]   Fri (6540)   0  0.50 LME  Comm Ins   [place]  03/04/2017  1   03/04/2017  Zolpidem Tartrate 10 MG Tablet  30.00 30 Je Cop   [id number]   [facility] (0719)   0  0.50 LME  Comm Ins     01/25/2017  1   11/12/2016  Zolpidem Tartrate 10 MG Tablet  30.00 30 Je Cop   [id number]   [facility] (0719)   2  0.50 LME  Lab Results  Component Value Date   PSA 2.03 11/18/2017   PSA 1.90 11/12/2016   PSA 1.97 01/05/2016    Patient Active Problem List   Diagnosis Date Noted  . BMI 25.0-25.9,adult 11/11/2015  . Family history of coronary artery disease in father 10/25/2015  . Paroxysmal atrial fibrillation (HCC) 10/11/2015  . Dyslipidemia 10/10/2015  . Depression     Past Medical History:  Diagnosis Date  . Depression   . Duodenitis 10/10/2015  . PAF (paroxysmal atrial fibrillation) (HCC) Jan 2017   lone AF in setting of sepsis  . Sepsis secondary to UTI (HCC) 10/10/2015    Past Surgical History:  Procedure Laterality Date  . APPENDECTOMY    . CARDIOVASCULAR STRESS TEST  01/22/2014   GXT - Ex 10 min (10.9 METs), HR 164 = 103% MPHR. No ST-T wave chagnes or Sx.  LOW RISK  --[facility] TM Score 10   . VASECTOMY      Social History   Tobacco Use  . Smoking status: Never Smoker  .  Smokeless tobacco: Never Used  Substance Use Topics  . Alcohol use: No    Alcohol/week: 0.0 standard drinks  . Drug use: No    Family History  Problem Relation Age of Onset  . Cancer Mother   . Cancer Father   . Heart disease Father   . Stroke Father   . Cancer Maternal Grandmother   . Stroke Maternal Grandfather   . Coronary artery disease Unknown   . Cancer Unknown   . Stroke Unknown   . Arrhythmia Unknown     Allergies  Allergen Reactions  . Sulfa Antibiotics     childhood    Medication list has been reviewed and updated.  Current Outpatient Medications on File Prior to Visit  Medication Sig Dispense Refill  . amitriptyline (ELAVIL) 25 MG tablet TAKE 2 TABLETS BY MOUTH AT BEDTIME AS NEEDED FOR SLEEP 180 tablet 1  . aspirin 81 MG tablet Take 81 mg by mouth daily.    Marland Kitchen atorvastatin (LIPITOR) 10 MG tablet TAKE 1 TABLET BY MOUTH EVERY DAY 90 tablet 1  . butalbital-acetaminophen-caffeine (FIORICET, ESGIC) 50-325-40 MG tablet Take 1 tablet BY MOUTH every 6 hours as needed for headache. Do not take with additional tylenol 30 tablet 0  . Multiple Vitamin (MULTIVITAMIN) tablet Take 1 tablet by mouth daily.    . naproxen sodium (ALEVE) 220 MG tablet Take 220 mg by mouth 2 (two) times daily as needed.    Marland Kitchen PARoxetine (PAXIL) 30 MG tablet TAKE 1 TABLET BY MOUTH EVERY DAY 30 tablet 0  . zolpidem (AMBIEN) 10 MG tablet Take 1 tablet (10 mg total) at bedtime by mouth. Take 1/2 or 1 as needed for sleep 30 tablet 2   No current facility-administered medications on file prior to visit.     Review of Systems:  As per HPI- otherwise negative.  No fever or chills, no chest pain or shortness of breath. No genital or prostate concerns Physical Examination: Vitals:   11/24/18 1157  BP: 120/86  Pulse: 81  Resp: 16  Temp: 98 F (36.7 C)  SpO2: 98%   Vitals:   11/24/18 1157  Weight: 170 lb 6.4 oz (77.3 kg)  Height: 5\' 8"  (1.727 m)   Body mass index is 25.91 kg/m. Ideal Body  Weight: Weight in (lb) to have BMI = 25: 164.1  GEN: WDWN, NAD, Non-toxic, A & O x 3, normal weight, looks well HEENT: Atraumatic, Normocephalic. Neck supple. No  masses, No LAD. Ears and Nose: No external deformity. CV: RRR, No M/G/R. No JVD. No thrill. No extra heart sounds. PULM: CTA B, no wheezes, crackles, rhonchi. No retractions. No resp. distress. No accessory muscle use. ABD: S, NT, ND, +BS. No rebound. No HSM. EXTR: No c/c/e NEURO Normal gait.  PSYCH: Normally interactive. Conversant. Not depressed or anxious appearing.  Calm demeanor.    Assessment and Plan: Physical exam  Screening for prostate cancer - Plan: PSA  Dyslipidemia - Plan: Lipid panel  Medication monitoring encounter - Plan: CBC, Comprehensive metabolic panel  Screening for diabetes mellitus - Plan: Comprehensive metabolic panel, Hemoglobin A1c  Hearing aid consultation - Plan: Ambulatory referral to Audiology  Depression, unspecified depression type - Plan: PARoxetine (PAXIL) 30 MG tablet  Episodic paroxysmal hemicrania, not intractable - Plan: butalbital-acetaminophen-caffeine (FIORICET, ESGIC) 50-325-40 MG tablet  Insomnia, unspecified type - Plan: zolpidem (AMBIEN) 10 MG tablet  Immunization due - Plan: Pneumococcal polysaccharide vaccine 23-valent greater than or equal to 2yo subcutaneous/IM  Here today for complete physical.  Routine labs pain as above.  Refilled his medications as needed. He uses Ambien and Fioricet on occasion, has not refilled in some time.  Provided refills for these medications today Given Pneumovax today He plans to get Shingrix at the drugstore Referral to audiology for his routine hearing check  Signed Abbe Amsterdam, MD  Received his labs- message to pt  Results for orders placed or performed in visit on 11/24/18  CBC  Result Value Ref Range   WBC 4.6 4.0 - 10.5 K/uL   RBC 5.03 4.22 - 5.81 Mil/uL   Platelets 195.0 150.0 - 400.0 K/uL   Hemoglobin 14.9 13.0 - 17.0  g/dL   HCT 68.1 15.7 - 26.2 %   MCV 88.6 78.0 - 100.0 fl   MCHC 33.4 30.0 - 36.0 g/dL   RDW 03.5 59.7 - 41.6 %  Comprehensive metabolic panel  Result Value Ref Range   Sodium 138 135 - 145 mEq/L   Potassium 4.3 3.5 - 5.1 mEq/L   Chloride 104 96 - 112 mEq/L   CO2 25 19 - 32 mEq/L   Glucose, Bld 81 70 - 99 mg/dL   BUN 21 6 - 23 mg/dL   Creatinine, Ser 3.84 0.40 - 1.50 mg/dL   Total Bilirubin 0.4 0.2 - 1.2 mg/dL   Alkaline Phosphatase 115 39 - 117 U/L   AST 21 0 - 37 U/L   ALT 21 0 - 53 U/L   Total Protein 6.8 6.0 - 8.3 g/dL   Albumin 4.4 3.5 - 5.2 g/dL   Calcium 9.5 8.4 - 53.6 mg/dL   GFR 468.03 >21.22 mL/min  Hemoglobin A1c  Result Value Ref Range   Hgb A1c MFr Bld 6.0 4.6 - 6.5 %  Lipid panel  Result Value Ref Range   Cholesterol 157 0 - 200 mg/dL   Triglycerides 482.5 0.0 - 149.0 mg/dL   HDL 00.37 >04.88 mg/dL   VLDL 89.1 0.0 - 69.4 mg/dL   LDL Cholesterol 77 0 - 99 mg/dL   Total CHOL/HDL Ratio 3    NonHDL 98.08   PSA  Result Value Ref Range   PSA 2.40 0.10 - 4.00 ng/mL   a1c is stable Lab Results  Component Value Date   PSA 2.40 11/24/2018   PSA 2.03 11/18/2017   PSA 1.90 11/12/2016   The 10-year ASCVD risk score Denman George DC Jr., et al., 2013) is: 9.6%   Values used to calculate the score:  Age: 18 years     Sex: Male     Is Non-Hispanic African American: No     Diabetic: No     Tobacco smoker: No     Systolic Blood Pressure: 120 mmHg     Is BP treated: No     HDL Cholesterol: 59.3 mg/dL     Total Cholesterol: 157 mg/dL  Blood counts are normal Metabolic profile looks fine Your A1c is stable in the pre-diabetes range Cholesterol is favorable- continue lipitor  Your PSA is still in the ok range but has gone up since last check I would like to recheck in 3 months- please schedule a lab visit only so we can see if your PSA may go back down  Lab Results      Component                Value               Date                      PSA                       2.40                11/24/2018                PSA                      2.03                11/18/2017                PSA                      1.90                11/12/2016             Let me know if any questions or concerns!  Take care

## 2018-11-20 ENCOUNTER — Other Ambulatory Visit: Payer: Self-pay | Admitting: Family Medicine

## 2018-11-20 DIAGNOSIS — F32A Depression, unspecified: Secondary | ICD-10-CM

## 2018-11-20 DIAGNOSIS — F329 Major depressive disorder, single episode, unspecified: Secondary | ICD-10-CM

## 2018-11-24 ENCOUNTER — Encounter: Payer: Self-pay | Admitting: Family Medicine

## 2018-11-24 ENCOUNTER — Ambulatory Visit (INDEPENDENT_AMBULATORY_CARE_PROVIDER_SITE_OTHER): Payer: Medicare Other | Admitting: Family Medicine

## 2018-11-24 VITALS — BP 120/86 | HR 81 | Temp 98.0°F | Resp 16 | Ht 68.0 in | Wt 170.4 lb

## 2018-11-24 DIAGNOSIS — E785 Hyperlipidemia, unspecified: Secondary | ICD-10-CM

## 2018-11-24 DIAGNOSIS — Z23 Encounter for immunization: Secondary | ICD-10-CM

## 2018-11-24 DIAGNOSIS — Z125 Encounter for screening for malignant neoplasm of prostate: Secondary | ICD-10-CM

## 2018-11-24 DIAGNOSIS — Z0000 Encounter for general adult medical examination without abnormal findings: Secondary | ICD-10-CM | POA: Diagnosis not present

## 2018-11-24 DIAGNOSIS — F329 Major depressive disorder, single episode, unspecified: Secondary | ICD-10-CM

## 2018-11-24 DIAGNOSIS — Z5181 Encounter for therapeutic drug level monitoring: Secondary | ICD-10-CM

## 2018-11-24 DIAGNOSIS — F32A Depression, unspecified: Secondary | ICD-10-CM

## 2018-11-24 DIAGNOSIS — Z131 Encounter for screening for diabetes mellitus: Secondary | ICD-10-CM | POA: Diagnosis not present

## 2018-11-24 DIAGNOSIS — Z7189 Other specified counseling: Secondary | ICD-10-CM

## 2018-11-24 DIAGNOSIS — G44039 Episodic paroxysmal hemicrania, not intractable: Secondary | ICD-10-CM

## 2018-11-24 DIAGNOSIS — G4700 Insomnia, unspecified: Secondary | ICD-10-CM

## 2018-11-24 LAB — COMPREHENSIVE METABOLIC PANEL
ALBUMIN: 4.4 g/dL (ref 3.5–5.2)
ALT: 21 U/L (ref 0–53)
AST: 21 U/L (ref 0–37)
Alkaline Phosphatase: 115 U/L (ref 39–117)
BUN: 21 mg/dL (ref 6–23)
CALCIUM: 9.5 mg/dL (ref 8.4–10.5)
CHLORIDE: 104 meq/L (ref 96–112)
CO2: 25 meq/L (ref 19–32)
Creatinine, Ser: 0.75 mg/dL (ref 0.40–1.50)
GFR: 104.19 mL/min (ref >60.00)
Glucose, Bld: 81 mg/dL (ref 70–99)
POTASSIUM: 4.3 meq/L (ref 3.5–5.1)
Sodium: 138 mEq/L (ref 135–145)
Total Bilirubin: 0.4 mg/dL (ref 0.2–1.2)
Total Protein: 6.8 g/dL (ref 6.0–8.3)

## 2018-11-24 LAB — CBC
HEMATOCRIT: 44.5 % (ref 39.0–52.0)
HEMOGLOBIN: 14.9 g/dL (ref 13.0–17.0)
MCHC: 33.4 g/dL (ref 30.0–36.0)
MCV: 88.6 fl (ref 78.0–100.0)
Platelets: 195 10*3/uL (ref 150.0–400.0)
RBC: 5.03 Mil/uL (ref 4.22–5.81)
RDW: 14.1 % (ref 11.5–15.5)
WBC: 4.6 10*3/uL (ref 4.0–10.5)

## 2018-11-24 LAB — LIPID PANEL
CHOLESTEROL: 157 mg/dL (ref 0–200)
HDL: 59.3 mg/dL (ref >39.00)
LDL Cholesterol: 77 mg/dL (ref 0–99)
NonHDL: 98.08
TRIGLYCERIDES: 104 mg/dL (ref 0.0–149.0)
Total CHOL/HDL Ratio: 3
VLDL: 20.8 mg/dL (ref 0.0–40.0)

## 2018-11-24 LAB — HEMOGLOBIN A1C: HEMOGLOBIN A1C: 6 % (ref 4.6–6.5)

## 2018-11-24 LAB — PSA: PSA: 2.4 ng/mL (ref 0.10–4.00)

## 2018-11-24 MED ORDER — BUTALBITAL-APAP-CAFFEINE 50-325-40 MG PO TABS: ORAL_TABLET | ORAL | 0 refills | Status: DC

## 2018-11-24 MED ORDER — PAROXETINE HCL 30 MG PO TABS: 30.0000 mg | ORAL_TABLET | Freq: Every day | ORAL | 3 refills | Status: DC

## 2018-11-24 MED ORDER — ZOLPIDEM TARTRATE 10 MG PO TABS: 5.0000 mg | ORAL_TABLET | Freq: Every day | ORAL | 1 refills | Status: DC

## 2018-11-24 NOTE — Addendum Note (Signed)
Addended by: Abbe Amsterdam C on: 11/24/2018 09:03 PM   Modules accepted: Orders

## 2018-11-24 NOTE — Patient Instructions (Addendum)
Please think about getting the shingles vaccine- shingrix- at your drug store   You got your 2nd pneumonia vaccine today  Continue to stay physically active and let me know if you would like to do a new stress test at some point   I refilled your medications today- continue to use fiorcet and ambien as sparingly as possible  Let me know when you need new refills of your medications  Take care, I will be in touch with your labs asap    Health Maintenance After Age 66 After age 73, you are at a higher risk for certain long-term diseases and infections as well as injuries from falls. Falls are a major cause of broken bones and head injuries in people who are older than age 83. Getting regular preventive care can help to keep you healthy and well. Preventive care includes getting regular testing and making lifestyle changes as recommended by your health care provider. Talk with your health care provider about:  Which screenings and tests you should have. A screening is a test that checks for a disease when you have no symptoms.  A diet and exercise plan that is right for you. What should I know about screenings and tests to prevent falls? Screening and testing are the best ways to find a health problem early. Early diagnosis and treatment give you the best chance of managing medical conditions that are common after age 89. Certain conditions and lifestyle choices may make you more likely to have a fall. Your health care provider may recommend:  Regular vision checks. Poor vision and conditions such as cataracts can make you more likely to have a fall. If you wear glasses, make sure to get your prescription updated if your vision changes.  Medicine review. Work with your health care provider to regularly review all of the medicines you are taking, including over-the-counter medicines. Ask your health care provider about any side effects that may make you more likely to have a fall. Tell your  health care provider if any medicines that you take make you feel dizzy or sleepy.  Osteoporosis screening. Osteoporosis is a condition that causes the bones to get weaker. This can make the bones weak and cause them to break more easily.  Blood pressure screening. Blood pressure changes and medicines to control blood pressure can make you feel dizzy.  Strength and balance checks. Your health care provider may recommend certain tests to check your strength and balance while standing, walking, or changing positions.  Foot health exam. Foot pain and numbness, as well as not wearing proper footwear, can make you more likely to have a fall.  Depression screening. You may be more likely to have a fall if you have a fear of falling, feel emotionally low, or feel unable to do activities that you used to do.  Alcohol use screening. Using too much alcohol can affect your balance and may make you more likely to have a fall. What actions can I take to lower my risk of falls? General instructions  Talk with your health care provider about your risks for falling. Tell your health care provider if: ? You fall. Be sure to tell your health care provider about all falls, even ones that seem minor. ? You feel dizzy, sleepy, or off-balance.  Take over-the-counter and prescription medicines only as told by your health care provider. These include any supplements.  Eat a healthy diet and maintain a healthy weight. A healthy diet includes low-fat dairy  products, low-fat (lean) meats, and fiber from whole grains, beans, and lots of fruits and vegetables. Home safety  Remove any tripping hazards, such as rugs, cords, and clutter.  Install safety equipment such as grab bars in bathrooms and safety rails on stairs.  Keep rooms and walkways well-lit. Activity   Follow a regular exercise program to stay fit. This will help you maintain your balance. Ask your health care provider what types of exercise are  appropriate for you.  If you need a cane or walker, use it as recommended by your health care provider.  Wear supportive shoes that have nonskid soles. Lifestyle  Do not drink alcohol if your health care provider tells you not to drink.  If you drink alcohol, limit how much you have: ? 0-1 drink a day for women. ? 0-2 drinks a day for men.  Be aware of how much alcohol is in your drink. In the [place] one drink equals one typical bottle of beer (12 oz), one-half glass of wine (5 oz), or one shot of hard liquor (1 oz).  Do not use any products that contain nicotine or tobacco, such as cigarettes and e-cigarettes. If you need help quitting, ask your health care provider. Summary  Having a healthy lifestyle and getting preventive care can help to protect your health and wellness after age 25.  Screening and testing are the best way to find a health problem early and help you avoid having a fall. Early diagnosis and treatment give you the best chance for managing medical conditions that are more common for people who are older than age 29.  Falls are a major cause of broken bones and head injuries in people who are older than age 75. Take precautions to prevent a fall at home.  Work with your health care provider to learn what changes you can make to improve your health and wellness and to prevent falls. This information is not intended to replace advice given to you by your health care provider. Make sure you discuss any questions you have with your health care provider. Document Released: 07/17/2017 Document Revised: 07/17/2017 Document Reviewed: 07/17/2017 Elsevier Interactive Patient Education  2019 [employer].

## 2018-12-02 ENCOUNTER — Telehealth: Payer: Self-pay | Admitting: Family Medicine

## 2018-12-02 NOTE — Telephone Encounter (Signed)
Copied from CRM [id number]. Topic: Referral - Question >> Dec 02, 2018  1:38 PM Fanny Bien wrote: CRM for notification. See Telephone encounter for: 12/02/18. Marian Sorrow calling [facility] is unable to accept the referral because medicare will not cover this. Marian Sorrow states that if this is for a hearing evaluation medicare will pay. Marian Sorrow stated that she will also need all current medicare and other insurance cards. Please advise

## 2018-12-05 ENCOUNTER — Encounter: Payer: Self-pay | Admitting: Family Medicine

## 2019-02-04 ENCOUNTER — Other Ambulatory Visit: Payer: Self-pay | Admitting: Family Medicine

## 2019-02-04 DIAGNOSIS — E7800 Pure hypercholesterolemia, unspecified: Secondary | ICD-10-CM

## 2019-02-04 DIAGNOSIS — G4700 Insomnia, unspecified: Secondary | ICD-10-CM

## 2019-04-21 ENCOUNTER — Telehealth: Payer: Self-pay | Admitting: *Deleted

## 2019-04-21 DIAGNOSIS — G44039 Episodic paroxysmal hemicrania, not intractable: Secondary | ICD-10-CM

## 2019-04-21 MED ORDER — BUTALBITAL-APAP-CAFFEINE 50-325-40 MG PO TABS: ORAL_TABLET | ORAL | 0 refills | Status: DC

## 2019-04-21 NOTE — Telephone Encounter (Signed)
[facility] requesting refill for Fioricet.  Last filled 11/28/18.

## 2019-06-20 ENCOUNTER — Other Ambulatory Visit: Payer: Self-pay | Admitting: Family Medicine

## 2019-06-20 DIAGNOSIS — G44039 Episodic paroxysmal hemicrania, not intractable: Secondary | ICD-10-CM

## 2019-06-26 ENCOUNTER — Other Ambulatory Visit: Payer: Self-pay | Admitting: Family Medicine

## 2019-06-26 DIAGNOSIS — G4700 Insomnia, unspecified: Secondary | ICD-10-CM

## 2019-06-26 NOTE — Telephone Encounter (Signed)
Requesting:Ambien Contract:none, needs csc AFB:XUXY, needs uds Last Visit:11/24/2018 Next Visit:none scheduled Last Refill:11/24/2018  Please Advise

## 2019-07-08 ENCOUNTER — Other Ambulatory Visit: Payer: Self-pay | Admitting: Family Medicine

## 2019-07-08 DIAGNOSIS — G44039 Episodic paroxysmal hemicrania, not intractable: Secondary | ICD-10-CM

## 2019-07-08 MED ORDER — BUTALBITAL-APAP-CAFFEINE 50-325-40 MG PO TABS: ORAL_TABLET | ORAL | 0 refills | Status: DC

## 2019-07-08 NOTE — Telephone Encounter (Signed)
Done

## 2019-07-08 NOTE — Telephone Encounter (Signed)
Pt's wife calling to check on this.  States that pharmacy stated that they never got the script for this.  Please resend.

## 2019-07-08 NOTE — Telephone Encounter (Signed)
Medication printed. Could you resend to pharmacy electronically?

## 2019-07-21 ENCOUNTER — Telehealth: Payer: Self-pay | Admitting: Family Medicine

## 2019-07-21 NOTE — Telephone Encounter (Signed)
Patient declined AWV at this time. SF °

## 2019-08-21 ENCOUNTER — Other Ambulatory Visit: Payer: Self-pay

## 2019-08-21 DIAGNOSIS — Z20822 Contact with and (suspected) exposure to covid-19: Secondary | ICD-10-CM

## 2019-08-23 LAB — NOVEL CORONAVIRUS, NAA: SARS-CoV-2, NAA: NOT DETECTED

## 2019-10-09 ENCOUNTER — Ambulatory Visit: Payer: Medicare Other | Attending: Internal Medicine

## 2019-10-09 DIAGNOSIS — Z23 Encounter for immunization: Secondary | ICD-10-CM

## 2019-10-09 NOTE — Progress Notes (Signed)
   Covid-19 Vaccination Clinic  Name:  Brian [name]    MRN: [id number] DOB: 1950-11-11  10/09/2019  Brian [name] was observed post Covid-19 immunization for 15 minutes without incidence. He was provided with Vaccine Information Sheet and instruction to access the V-Safe system.   Brian [name] was instructed to call 911 with any severe reactions post vaccine: Marland Kitchen Difficulty breathing  . Swelling of your face and throat  . A fast heartbeat  . A bad rash all over your body  . Dizziness and weakness    Immunizations Administered    Name Date Dose VIS Date Route   Pfizer COVID-19 Vaccine 10/09/2019  8:37 AM 0.3 mL 08/28/2019 Intramuscular   Manufacturer: [employer], [employer]   Lot: [id number]   NDC: [address]-4

## 2019-10-22 ENCOUNTER — Other Ambulatory Visit: Payer: Self-pay | Admitting: Family Medicine

## 2019-10-22 DIAGNOSIS — F329 Major depressive disorder, single episode, unspecified: Secondary | ICD-10-CM

## 2019-10-22 DIAGNOSIS — F32A Depression, unspecified: Secondary | ICD-10-CM

## 2019-10-30 ENCOUNTER — Ambulatory Visit: Payer: Medicare Other | Attending: Internal Medicine

## 2019-10-30 DIAGNOSIS — Z23 Encounter for immunization: Secondary | ICD-10-CM

## 2019-10-30 NOTE — Progress Notes (Signed)
   Covid-19 Vaccination Clinic  Name:  Brian [name]    MRN: [id number] DOB: [birth year]  10/30/2019  Brian [name] was observed post Covid-19 immunization for 15 minutes without incidence. He was provided with Vaccine Information Sheet and instruction to access the V-Safe system.   Brian [name] was instructed to call 911 with any severe reactions post vaccine: Marland Kitchen Difficulty breathing  . Swelling of your face and throat  . A fast heartbeat  . A bad rash all over your body  . Dizziness and weakness    Immunizations Administered    Name Date Dose VIS Date Route   Pfizer COVID-19 Vaccine 10/30/2019  9:29 AM 0.3 mL 08/28/2019 Intramuscular   Manufacturer: [employer], [employer]   Lot: [id number]   NDC: [address]-2

## 2019-11-12 ENCOUNTER — Other Ambulatory Visit: Payer: Self-pay | Admitting: Family Medicine

## 2019-11-12 DIAGNOSIS — E7800 Pure hypercholesterolemia, unspecified: Secondary | ICD-10-CM

## 2019-11-12 DIAGNOSIS — G4700 Insomnia, unspecified: Secondary | ICD-10-CM

## 2019-11-14 ENCOUNTER — Other Ambulatory Visit: Payer: Self-pay | Admitting: Family Medicine

## 2019-11-14 DIAGNOSIS — E7800 Pure hypercholesterolemia, unspecified: Secondary | ICD-10-CM

## 2019-11-14 DIAGNOSIS — G4700 Insomnia, unspecified: Secondary | ICD-10-CM

## 2019-11-25 NOTE — Progress Notes (Signed)
Nurse connected with patient 11/26/19 at  9:30 AM EST by a telephone enabled telemedicine application and verified that I am speaking with the correct person using two identifiers. Patient stated full name and DOB. Patient gave permission to continue with virtual visit. Patient's location was at home and Nurse's location was at [place] office.  Subjective:   Brian [name] is a 69 y.o. male who presents for Medicare Annual/Subsequent preventive examination.  Review of Systems:  Cardiac Risk Factors include: advanced age (>70men, >31 women);male gender No ROS.  Medicare Wellness Virtual Visit.  Visual/audio telehealth visit, UTA vital signs.   See social history for additional risk factors.  Home Safety/Smoke Alarms: Feels safe in home. Smoke alarms in place.  Lives w/ wife in 2 story.   Male:   CCS-  Pt reports next due 2025.   PSA-  Lab Results  Component Value Date   PSA 2.40 11/24/2018   PSA 2.03 11/18/2017   PSA 1.90 11/12/2016       Objective:    Vitals:Unable to assess. This visit is enabled though telemedicine due to Covid 19.   Advanced Directives 11/26/2019 11/26/2019 10/10/2015  Does Patient Have a Medical Advance Directive? Yes No;Yes Yes  Type of Advance Directive - Healthcare Power of [place];Living will Healthcare Power of [place];Living will  Does patient want to make changes to medical advance directive? - No - Patient declined No - Patient declined  Copy of Healthcare Power of Attorney in Chart? - No - copy requested No - copy requested  Would patient like information on creating a medical advance directive? - No - Patient declined -    Tobacco Social History   Tobacco Use  Smoking Status Never Smoker  Smokeless Tobacco Never Used     Counseling given: Not Answered   Clinical Intake: Pain : No/denies pain    Past Medical History:  Diagnosis Date  . Depression   . Duodenitis 10/10/2015  . PAF (paroxysmal atrial fibrillation) (HCC) Jan 2017   lone AF in setting of sepsis  . Sepsis secondary to UTI (HCC) 10/10/2015   Past Surgical History:  Procedure Laterality Date  . APPENDECTOMY    . CARDIOVASCULAR STRESS TEST  01/22/2014   GXT - Ex 10 min (10.9 METs), HR 164 = 103% MPHR. No ST-T wave chagnes or Sx.  LOW RISK  --[facility] TM Score 10   . VASECTOMY     Family History  Problem Relation Age of Onset  . Cancer Mother   . Cancer Father   . Heart disease Father   . Stroke Father   . Cancer Maternal Grandmother   . Stroke Maternal Grandfather   . Coronary artery disease Other   . Cancer Other   . Stroke Other   . Arrhythmia Other    Social History   Socioeconomic History  . Marital status: Married    Spouse name: Brian [name]  . Number of children: 2  . Years of education: College  . Highest education level: Not on file  Occupational History  . Occupation: retired bus Psychiatric nurse  . Smoking status: Never Smoker  . Smokeless tobacco: Never Used  Substance and Sexual Activity  . Alcohol use: No    Alcohol/week: 0.0 standard drinks  . Drug use: No  . Sexual activity: Yes    Partners: Female  Other Topics Concern  . Not on file  Social History Narrative   Lives with his wife, a Teacher, early years/pre.   Exercise- silver sneakers  Social Determinants of Health   Financial Resource Strain:   . Difficulty of Paying Living Expenses:   Food Insecurity:   . Worried About Charity fundraiser in the Last Year:   . Arboriculturist in the Last Year:   Transportation Needs:   . Film/video editor (Medical):   Marland Kitchen Lack of Transportation (Non-Medical):   Physical Activity:   . Days of Exercise per Week:   . Minutes of Exercise per Session:   Stress:   . Feeling of Stress :   Social Connections:   . Frequency of Communication with Friends and Family:   . Frequency of Social Gatherings with Friends and Family:   . Attends Religious Services:   . Active Member of Clubs or Organizations:   . Attends Archivist  Meetings:   Marland Kitchen Marital Status:     Outpatient Encounter Medications as of 11/26/2019  Medication Sig  . amitriptyline (ELAVIL) 25 MG tablet TAKE 2 TABLETS BY MOUTH AT BEDTIME AS NEEDED SLEEP. need office visit FOR additional refills  . aspirin 81 MG tablet Take 81 mg by mouth daily.  Marland Kitchen atorvastatin (LIPITOR) 10 MG tablet TAKE 1 TABLET BY MOUTH EVERY DAY. need office visit FOR additional refills  . butalbital-acetaminophen-caffeine (FIORICET) 50-325-40 MG tablet TAKE 1 TABLET BY MOUTH EVERY 6 HOURS AS NEEDED FOR HEADACHE. DO NOT TAKE WITH ADDITIONAL TYLENOL.  . Multiple Vitamin (MULTIVITAMIN) tablet Take 1 tablet by mouth daily.  . naproxen sodium (ALEVE) 220 MG tablet Take 220 mg by mouth 2 (two) times daily as needed.  Marland Kitchen PARoxetine (PAXIL) 30 MG tablet TAKE 1 TABLET BY MOUTH EVERY DAY  . zolpidem (AMBIEN) 10 MG tablet TAKE 1/2 TABLET BY MOUTH AT BEDTIME occasionally FOR insomnia   No facility-administered encounter medications on file as of 11/26/2019.    Activities of Daily Living In your present state of health, do you have any difficulty performing the following activities: 11/26/2019  Hearing? N  Vision? N  Difficulty concentrating or making decisions? N  Walking or climbing stairs? N  Dressing or bathing? N  Doing errands, shopping? N  Preparing Food and eating ? N  Using the Toilet? N  In the past six months, have you accidently leaked urine? N  Do you have problems with loss of bowel control? N  Managing your Medications? N  Managing your Finances? N  Housekeeping or managing your Housekeeping? N  Some recent data might be hidden    Patient Care Team: Copland, Gay Filler, MD as PCP - General (Family Medicine) Ronald Lobo, MD (Gastroenterology)   Assessment:   This is a routine wellness examination for Brian [name]. Physical assessment deferred to PCP.  Exercise Activities and Dietary recommendations Current Exercise Habits: Home exercise routine, Type of exercise:  walking, Time (Minutes): 30, Frequency (Times/Week): 3, Weekly Exercise (Minutes/Week): 90, Intensity: Mild, Exercise limited by: None identified Diet (meal preparation, eat out, water intake, caffeinated beverages, dairy products, fruits and vegetables): well balanced    Goals    . Maintain healthy active lifestyle.       Fall Risk Fall Risk  11/26/2019 11/18/2017 12/24/2014 12/22/2013  Falls in the past year? 0 No No No  Number falls in past yr: 0 - - -  Injury with Fall? 0 - - -  Follow up Education provided;Falls prevention discussed - - -   Depression Screen PHQ 2/9 Scores 11/26/2019 11/18/2017 11/11/2015 12/24/2014  PHQ - 2 Score 0 0 0 0  Cognitive Function Ad8 score reviewed for issues:  Issues making decisions:no  Less interest in hobbies / activities:no  Repeats questions, stories (family complaining):no  Trouble using ordinary gadgets (microwave, computer, phone):no  Forgets the month or year: no  Mismanaging finances: no  Remembering appts:no  Daily problems with thinking and/or memory:no Ad8 score is=0            Immunization History  Administered Date(s) Administered  . Influenza Split 06/08/2010, 07/18/2012  . PFIZER SARS-COV-2 Vaccination 10/09/2019, 10/30/2019  . Pneumococcal Conjugate-13 11/18/2017  . Pneumococcal Polysaccharide-23 11/24/2018  . Tdap 06/08/2010  . Zoster 02/26/2013  . Zoster Recombinat (Shingrix) 04/24/2019, 07/31/2019    Screening Tests Health Maintenance  Topic Date Due  . INFLUENZA VACCINE  07/21/2024 (Originally 04/18/2019)  . TETANUS/TDAP  06/08/2020  . COLONOSCOPY  01/15/2025  . Hepatitis C Screening  Completed  . PNA vac Low Risk Adult  Completed     Plan:    Please schedule your next medicare wellness visit with me in 1 yr.  Keep up the great work!  I have personally reviewed and noted the following in the patient's chart:   . Medical and social history . Use of alcohol, tobacco or illicit drugs  . Current  medications and supplements . Functional ability and status . Nutritional status . Physical activity . Advanced directives . List of other physicians . Hospitalizations, surgeries, and ER visits in previous 12 months . Vitals . Screenings to include cognitive, depression, and falls . Referrals and appointments  In addition, I have reviewed and discussed with patient certain preventive protocols, quality metrics, and best practice recommendations. A written personalized care plan for preventive services as well as general preventive health recommendations were provided to patient.     [place]on Gully, [place]  11/26/2019

## 2019-11-26 ENCOUNTER — Ambulatory Visit (INDEPENDENT_AMBULATORY_CARE_PROVIDER_SITE_OTHER): Payer: Medicare Other | Admitting: *Deleted

## 2019-11-26 ENCOUNTER — Encounter: Payer: Self-pay | Admitting: *Deleted

## 2019-11-26 ENCOUNTER — Other Ambulatory Visit: Payer: Self-pay

## 2019-11-26 DIAGNOSIS — Z0000 Encounter for general adult medical examination without abnormal findings: Secondary | ICD-10-CM | POA: Diagnosis not present

## 2019-11-26 NOTE — Patient Instructions (Signed)
Please schedule your next medicare wellness visit with me in 1 yr.  Keep up the great work!    Brian [name] , Thank you for taking time to come for your Medicare Wellness Visit. I appreciate your ongoing commitment to your health goals. Please review the following plan we discussed and let me know if I can assist you in the future.   These are the goals we discussed: Goals    . Maintain healthy active lifestyle.       This is a list of the screening recommended for you and due dates:  Health Maintenance  Topic Date Due  . Flu Shot  07/21/2024*  . Tetanus Vaccine  06/08/2020  . Colon Cancer Screening  01/15/2025  .  Hepatitis C: One time screening is recommended by Center for Disease Control  (CDC) for  adults born from [birth year] through 1963.   Completed  . Pneumonia vaccines  Completed  *Topic was postponed. The date shown is not the original due date.    Preventive Care 45 Years and Older, Male Preventive care refers to lifestyle choices and visits with your health care provider that can promote health and wellness. This includes:  A yearly physical exam. This is also called an annual well check.  Regular dental and eye exams.  Immunizations.  Screening for certain conditions.  Healthy lifestyle choices, such as diet and exercise. What can I expect for my preventive care visit? Physical exam Your health care provider will check:  Height and weight. These may be used to calculate body mass index (BMI), which is a measurement that tells if you are at a healthy weight.  Heart rate and blood pressure.  Your skin for abnormal spots. Counseling Your health care provider may ask you questions about:  Alcohol, tobacco, and drug use.  Emotional well-being.  Home and relationship well-being.  Sexual activity.  Eating habits.  History of falls.  Memory and ability to understand (cognition).  Work and work Statistician. What immunizations do I need?  Influenza (flu)  vaccine  This is recommended every year. Tetanus, diphtheria, and pertussis (Tdap) vaccine  You may need a Td booster every 10 years. Varicella (chickenpox) vaccine  You may need this vaccine if you have not already been vaccinated. Zoster (shingles) vaccine  You may need this after age 3. Pneumococcal conjugate (PCV13) vaccine  One dose is recommended after age 72. Pneumococcal polysaccharide (PPSV23) vaccine  One dose is recommended after age 32. Measles, mumps, and rubella (MMR) vaccine  You may need at least one dose of MMR if you were born in 1957 or later. You may also need a second dose. Meningococcal conjugate (MenACWY) vaccine  You may need this if you have certain conditions. Hepatitis A vaccine  You may need this if you have certain conditions or if you travel or work in places where you may be exposed to hepatitis A. Hepatitis B vaccine  You may need this if you have certain conditions or if you travel or work in places where you may be exposed to hepatitis B. Haemophilus influenzae type b (Hib) vaccine  You may need this if you have certain conditions. You may receive vaccines as individual doses or as more than one vaccine together in one shot (combination vaccines). Talk with your health care provider about the risks and benefits of combination vaccines. What tests do I need? Blood tests  Lipid and cholesterol levels. These may be checked every 5 years, or more frequently depending on  your overall health.  Hepatitis C test.  Hepatitis B test. Screening  Lung cancer screening. You may have this screening every year starting at age 62 if you have a 30-pack-year history of smoking and currently smoke or have quit within the past 15 years.  Colorectal cancer screening. All adults should have this screening starting at age 51 and continuing until age 24. Your health care provider may recommend screening at age 92 if you are at increased risk. You will have  tests every 1-10 years, depending on your results and the type of screening test.  Prostate cancer screening. Recommendations will vary depending on your family history and other risks.  Diabetes screening. This is done by checking your blood sugar (glucose) after you have not eaten for a while (fasting). You may have this done every 1-3 years.  Abdominal aortic aneurysm (AAA) screening. You may need this if you are a current or former smoker.  Sexually transmitted disease (STD) testing. Follow these instructions at home: Eating and drinking  Eat a diet that includes fresh fruits and vegetables, whole grains, lean protein, and low-fat dairy products. Limit your intake of foods with high amounts of sugar, saturated fats, and salt.  Take vitamin and mineral supplements as recommended by your health care provider.  Do not drink alcohol if your health care provider tells you not to drink.  If you drink alcohol: ? Limit how much you have to 0-2 drinks a day. ? Be aware of how much alcohol is in your drink. In the [place] one drink equals one 12 oz bottle of beer (355 mL), one 5 oz glass of wine (148 mL), or one 1 oz glass of hard liquor (44 mL). Lifestyle  Take daily care of your teeth and gums.  Stay active. Exercise for at least 30 minutes on 5 or more days each week.  Do not use any products that contain nicotine or tobacco, such as cigarettes, e-cigarettes, and chewing tobacco. If you need help quitting, ask your health care provider.  If you are sexually active, practice safe sex. Use a condom or other form of protection to prevent STIs (sexually transmitted infections).  Talk with your health care provider about taking a low-dose aspirin or statin. What's next?  Visit your health care provider once a year for a well check visit.  Ask your health care provider how often you should have your eyes and teeth checked.  Stay up to date on all vaccines. This information is not  intended to replace advice given to you by your health care provider. Make sure you discuss any questions you have with your health care provider. Document Revised: 08/28/2018 Document Reviewed: 08/28/2018 Elsevier Patient Education  2020 [employer].

## 2019-11-27 ENCOUNTER — Other Ambulatory Visit: Payer: Self-pay

## 2019-11-28 NOTE — Patient Instructions (Addendum)
It is great to see you again today, I will be in touch with your labs as soon as possible We will get you set up with neurology for a headache evaluation.  Let me know if any changes or worsening of your symptoms in the meantime    Health Maintenance After Age 67 After age 28, you are at a higher risk for certain long-term diseases and infections as well as injuries from falls. Falls are a major cause of broken bones and head injuries in people who are older than age 19. Getting regular preventive care can help to keep you healthy and well. Preventive care includes getting regular testing and making lifestyle changes as recommended by your health care provider. Talk with your health care provider about:  Which screenings and tests you should have. A screening is a test that checks for a disease when you have no symptoms.  A diet and exercise plan that is right for you. What should I know about screenings and tests to prevent falls? Screening and testing are the best ways to find a health problem early. Early diagnosis and treatment give you the best chance of managing medical conditions that are common after age 37. Certain conditions and lifestyle choices may make you more likely to have a fall. Your health care provider may recommend:  Regular vision checks. Poor vision and conditions such as cataracts can make you more likely to have a fall. If you wear glasses, make sure to get your prescription updated if your vision changes.  Medicine review. Work with your health care provider to regularly review all of the medicines you are taking, including over-the-counter medicines. Ask your health care provider about any side effects that may make you more likely to have a fall. Tell your health care provider if any medicines that you take make you feel dizzy or sleepy.  Osteoporosis screening. Osteoporosis is a condition that causes the bones to get weaker. This can make the bones weak and cause them to  break more easily.  Blood pressure screening. Blood pressure changes and medicines to control blood pressure can make you feel dizzy.  Strength and balance checks. Your health care provider may recommend certain tests to check your strength and balance while standing, walking, or changing positions.  Foot health exam. Foot pain and numbness, as well as not wearing proper footwear, can make you more likely to have a fall.  Depression screening. You may be more likely to have a fall if you have a fear of falling, feel emotionally low, or feel unable to do activities that you used to do.  Alcohol use screening. Using too much alcohol can affect your balance and may make you more likely to have a fall. What actions can I take to lower my risk of falls? General instructions  Talk with your health care provider about your risks for falling. Tell your health care provider if: ? You fall. Be sure to tell your health care provider about all falls, even ones that seem minor. ? You feel dizzy, sleepy, or off-balance.  Take over-the-counter and prescription medicines only as told by your health care provider. These include any supplements.  Eat a healthy diet and maintain a healthy weight. A healthy diet includes low-fat dairy products, low-fat (lean) meats, and fiber from whole grains, beans, and lots of fruits and vegetables. Home safety  Remove any tripping hazards, such as rugs, cords, and clutter.  Install safety equipment such as grab bars in  bathrooms and safety rails on stairs.  Keep rooms and walkways well-lit. Activity   Follow a regular exercise program to stay fit. This will help you maintain your balance. Ask your health care provider what types of exercise are appropriate for you.  If you need a cane or walker, use it as recommended by your health care provider.  Wear supportive shoes that have nonskid soles. Lifestyle  Do not drink alcohol if your health care provider tells  you not to drink.  If you drink alcohol, limit how much you have: ? 0-1 drink a day for women. ? 0-2 drinks a day for men.  Be aware of how much alcohol is in your drink. In the [place] one drink equals one typical bottle of beer (12 oz), one-half glass of wine (5 oz), or one shot of hard liquor (1 oz).  Do not use any products that contain nicotine or tobacco, such as cigarettes and e-cigarettes. If you need help quitting, ask your health care provider. Summary  Having a healthy lifestyle and getting preventive care can help to protect your health and wellness after age 21.  Screening and testing are the best way to find a health problem early and help you avoid having a fall. Early diagnosis and treatment give you the best chance for managing medical conditions that are more common for people who are older than age 11.  Falls are a major cause of broken bones and head injuries in people who are older than age 31. Take precautions to prevent a fall at home.  Work with your health care provider to learn what changes you can make to improve your health and wellness and to prevent falls. This information is not intended to replace advice given to you by your health care provider. Make sure you discuss any questions you have with your health care provider. Document Revised: 12/25/2018 Document Reviewed: 07/17/2017 Elsevier Patient Education  2020 [employer].

## 2019-11-28 NOTE — Progress Notes (Addendum)
Brian [name] at [facility] [address] [place], [place] [address] [phone]  Date:  11/30/2019   Name:  Brian [name]   DOB:  1951/02/05   MRN:  [id number]  PCP:  Brian Cables, MD    Chief Complaint: Annual Exam   History of Present Illness:  Brian [name] is a 69 y.o. very pleasant male patient who presents with the following:  Patient with history of atrial fibrillation, depression and dyslipidemia, prediabetes.  Here today for a physical. Last seen by myself about a year ago for physical exam He had one episode of atrial fibrillation 2017, we think due to acute gastroenterological illness.  He continues to take baby aspirin  He is married to [name]. He is a retired Emergency planning/management officer, he has 2 school-aged grandchildren who are now 7 and 9.  He enjoys helping to take care of them, building furniture and volunteering with church and [employer].  His grandkids are doing virtual school- he and his wife are helping to do the home schooling He is not doing as much volunteer work during the pandemic He helps to review the [employer]- this is still up and running  Vaccinations are up-to-date Colon cancer screening up-to-date Labs due today-most recent labs about 1 year ago  He is getting some cortisone shots in his bilateral knees per Dr Despina Hick- he is not at the point of considering surgery at this time.  He would like to keep with cortisone shots as long as they seem to work  He needs a hearing exam per [facility] -I will fax an order for him today  He notes headaches on a frequent basis- he has to use fioricet about 3x a month He notes that he is taking excedrin perhaps every other day for more minor headache He is getting a headache about every other day Can occur any time of day He cannot determine any pattern specifically  His headaches are getting gradually worse with time; started perhaps 20 years ago   His father also had chronic idiopathic headaches, his 51 yo daughter also suffers from frequent headaches.  She went off gluten and this helped   He is using amitriptyline at bedtime as needed for sleep He uses ambien on rare occasion 07/31/2019  1   06/27/2019  Zolpidem Tartrate 10 MG Tablet  15.00  30 Je Cop   [id number]   Fri (6540)   1  0.25 LME  Comm Ins   [place]  06/27/2019  1   06/27/2019  Zolpidem Tartrate 10 MG Tablet  15.00  30 Je Cop   [id number]   Fri (6540)   0  0.25 LME  Comm Ins   Seba Dalkai  11/28/2018  1   11/24/2018  Zolpidem Tartrate 10 MG Tablet  15.00  30 Je Cop   [id number]   Fri (6540)   0  0.25 LME        Needs medication refills today Patient Active Problem List   Diagnosis Date Noted  . BMI 25.0-25.9,adult 11/11/2015  . Family history of coronary artery disease in father 10/25/2015  . Paroxysmal atrial fibrillation (HCC) 10/11/2015  . Dyslipidemia 10/10/2015  . Depression     Past Medical History:  Diagnosis Date  . Depression   . Duodenitis 10/10/2015  . PAF (paroxysmal atrial fibrillation) (HCC) Jan 2017   lone AF in setting of sepsis  . Sepsis secondary to UTI (HCC) 10/10/2015  Past Surgical History:  Procedure Laterality Date  . APPENDECTOMY    . CARDIOVASCULAR STRESS TEST  01/22/2014   GXT - Ex 10 min (10.9 METs), HR 164 = 103% MPHR. No ST-T wave chagnes or Sx.  LOW RISK  --[facility] TM Score 10   . VASECTOMY      Social History   Tobacco Use  . Smoking status: Never Smoker  . Smokeless tobacco: Never Used  Substance Use Topics  . Alcohol use: No    Alcohol/week: 0.0 standard drinks  . Drug use: No    Family History  Problem Relation Age of Onset  . Cancer Mother   . Cancer Father   . Heart disease Father   . Stroke Father   . Cancer Maternal Grandmother   . Stroke Maternal Grandfather   . Coronary artery disease Other   . Cancer Other   . Stroke Other   . Arrhythmia Other     Allergies  Allergen Reactions  . Sulfa Antibiotics     childhood     Medication list has been reviewed and updated.  Current Outpatient Medications on File Prior to Visit  Medication Sig Dispense Refill  . amitriptyline (ELAVIL) 25 MG tablet TAKE 2 TABLETS BY MOUTH AT BEDTIME AS NEEDED SLEEP. need office visit FOR additional refills 30 tablet 0  . aspirin 81 MG tablet Take 81 mg by mouth daily.    Marland Kitchen atorvastatin (LIPITOR) 10 MG tablet TAKE 1 TABLET BY MOUTH EVERY DAY. need office visit FOR additional refills 15 tablet 0  . butalbital-acetaminophen-caffeine (FIORICET) 50-325-40 MG tablet TAKE 1 TABLET BY MOUTH EVERY 6 HOURS AS NEEDED FOR HEADACHE. DO NOT TAKE WITH ADDITIONAL TYLENOL. 30 tablet 0  . Multiple Vitamin (MULTIVITAMIN) tablet Take 1 tablet by mouth daily.    . naproxen sodium (ALEVE) 220 MG tablet Take 220 mg by mouth 2 (two) times daily as needed.    Marland Kitchen PARoxetine (PAXIL) 30 MG tablet TAKE 1 TABLET BY MOUTH EVERY DAY 90 tablet 0  . zolpidem (AMBIEN) 10 MG tablet TAKE 1/2 TABLET BY MOUTH AT BEDTIME occasionally FOR insomnia 30 tablet 0   No current facility-administered medications on file prior to visit.    Review of Systems:  As per HPI- otherwise negative.  No exertional chest pain or shortness of breath Physical Examination: Vitals:   11/30/19 0829  BP: 130/90  Pulse: 90  Resp: 16  Temp: (!) 96.6 F (35.9 C)  SpO2: 97%   Vitals:   11/30/19 0829  Weight: 169 lb (76.7 kg)  Height: 5\' 8"  (1.727 m)   Body mass index is 25.7 kg/m. Ideal Body Weight: Weight in (lb) to have BMI = 25: 164.1  GEN: no acute distress.  Normal weight, looks well HEENT: Atraumatic, Normocephalic.   Bilateral TM wnl, oropharynx normal.  PEERL,EOMI.   Wearing bilateral hearing aids Ears and Nose: No external deformity. CV: RRR, No M/G/R. No JVD. No thrill. No extra heart sounds. PULM: CTA B, no wheezes, crackles, rhonchi. No retractions. No resp. distress. No accessory muscle use. ABD: S, NT, ND, +BS. No rebound. No HSM.  Belly is benign EXTR: No  c/c/e PSYCH: Normally interactive. Conversant.    Assessment and Plan: Physical exam  Dyslipidemia - Plan: Lipid panel  Depression, unspecified depression type - Plan: PARoxetine (PAXIL) 30 MG tablet  Screening for prostate cancer - Plan: PSA  Prediabetes - Plan: Comprehensive metabolic panel, Hemoglobin A1c  Screening for deficiency anemia - Plan: CBC  Chronic nonintractable headache,  unspecified headache type - Plan: Ambulatory referral to Neurology  Insomnia, unspecified type - Plan: amitriptyline (ELAVIL) 25 MG tablet, zolpidem (AMBIEN) 10 MG tablet  Elevated cholesterol - Plan: atorvastatin (LIPITOR) 10 MG tablet  Episodic paroxysmal hemicrania, not intractable - Plan: butalbital-acetaminophen-caffeine (FIORICET) 50-325-40 MG tablet  Here today for routine physical exam Labs pending as above He has suffered from headaches for 2 years, is now interested in having a more thorough evaluation.  Will refer to neurology for full evaluation He drinks little caffeine, does use Excedrin on a regular basis.  He will occasionally use Fioricet, is also taking amitriptyline He currently takes amitriptyline 50 mg at bedtime for sleep, will occasionally use Ambien.  I explained that amitriptyline can also be used as a headache prophylactic in some people  Will plan further follow- up pending labs.   This visit occurred during the SARS-CoV-2 public health emergency.  Safety protocols were in place, including screening questions prior to the visit, additional usage of staff PPE, and extensive cleaning of exam room while observing appropriate contact time as indicated for disinfecting solutions.    Signed Abbe Amsterdam, MD Received his labs as below, message to patient  Results for orders placed or performed in visit on 11/30/19  CBC  Result Value Ref Range   WBC 4.7 4.0 - 10.5 K/uL   RBC 4.80 4.22 - 5.81 Mil/uL   Platelets 185.0 150.0 - 400.0 K/uL   Hemoglobin 14.4 13.0 - 17.0  g/dL   HCT 78.2 95.6 - 21.3 %   MCV 89.9 78.0 - 100.0 fl   MCHC 33.4 30.0 - 36.0 g/dL   RDW 08.6 57.8 - 46.9 %  Comprehensive metabolic panel  Result Value Ref Range   Sodium 139 135 - 145 mEq/L   Potassium 3.9 3.5 - 5.1 mEq/L   Chloride 103 96 - 112 mEq/L   CO2 29 19 - 32 mEq/L   Glucose, Bld 95 70 - 99 mg/dL   BUN 19 6 - 23 mg/dL   Creatinine, Ser 6.29 0.40 - 1.50 mg/dL   Total Bilirubin 0.3 0.2 - 1.2 mg/dL   Alkaline Phosphatase 123 (H) 39 - 117 U/L   AST 21 0 - 37 U/L   ALT 17 0 - 53 U/L   Total Protein 6.5 6.0 - 8.3 g/dL   Albumin 4.0 3.5 - 5.2 g/dL   GFR 528.41 >32.44 mL/min   Calcium 9.1 8.4 - 10.5 mg/dL  Hemoglobin W1U  Result Value Ref Range   Hgb A1c MFr Bld 6.0 4.6 - 6.5 %  Lipid panel  Result Value Ref Range   Cholesterol 153 0 - 200 mg/dL   Triglycerides 272.5 0.0 - 149.0 mg/dL   HDL 36.64 >40.34 mg/dL   VLDL 74.2 0.0 - 59.5 mg/dL   LDL Cholesterol 77 0 - 99 mg/dL   Total CHOL/HDL Ratio 3    NonHDL 102.98   PSA  Result Value Ref Range   PSA 2.00 0.10 - 4.00 ng/mL

## 2019-11-30 ENCOUNTER — Encounter: Payer: Self-pay | Admitting: Neurology

## 2019-11-30 ENCOUNTER — Encounter: Payer: Self-pay | Admitting: Family Medicine

## 2019-11-30 ENCOUNTER — Telehealth: Payer: Self-pay

## 2019-11-30 ENCOUNTER — Other Ambulatory Visit: Payer: Self-pay

## 2019-11-30 ENCOUNTER — Ambulatory Visit (INDEPENDENT_AMBULATORY_CARE_PROVIDER_SITE_OTHER): Payer: Medicare Other | Admitting: Family Medicine

## 2019-11-30 VITALS — BP 130/90 | HR 90 | Temp 96.6°F | Resp 16 | Ht 68.0 in | Wt 169.0 lb

## 2019-11-30 DIAGNOSIS — F329 Major depressive disorder, single episode, unspecified: Secondary | ICD-10-CM | POA: Diagnosis not present

## 2019-11-30 DIAGNOSIS — F32A Depression, unspecified: Secondary | ICD-10-CM

## 2019-11-30 DIAGNOSIS — E785 Hyperlipidemia, unspecified: Secondary | ICD-10-CM

## 2019-11-30 DIAGNOSIS — R519 Headache, unspecified: Secondary | ICD-10-CM

## 2019-11-30 DIAGNOSIS — Z130 Encounter for screening for diseases of the blood and blood-forming organs and certain disorders involving the immune mechanism: Secondary | ICD-10-CM | POA: Diagnosis not present

## 2019-11-30 DIAGNOSIS — E7800 Pure hypercholesterolemia, unspecified: Secondary | ICD-10-CM

## 2019-11-30 DIAGNOSIS — R7303 Prediabetes: Secondary | ICD-10-CM | POA: Diagnosis not present

## 2019-11-30 DIAGNOSIS — G44039 Episodic paroxysmal hemicrania, not intractable: Secondary | ICD-10-CM

## 2019-11-30 DIAGNOSIS — Z0000 Encounter for general adult medical examination without abnormal findings: Secondary | ICD-10-CM | POA: Diagnosis not present

## 2019-11-30 DIAGNOSIS — Z125 Encounter for screening for malignant neoplasm of prostate: Secondary | ICD-10-CM | POA: Diagnosis not present

## 2019-11-30 DIAGNOSIS — G8929 Other chronic pain: Secondary | ICD-10-CM

## 2019-11-30 DIAGNOSIS — G4700 Insomnia, unspecified: Secondary | ICD-10-CM

## 2019-11-30 LAB — LIPID PANEL
Cholesterol: 153 mg/dL (ref 0–200)
HDL: 50.1 mg/dL (ref >39.00)
LDL Cholesterol: 77 mg/dL (ref 0–99)
NonHDL: 102.98
Total CHOL/HDL Ratio: 3
Triglycerides: 129 mg/dL (ref 0.0–149.0)
VLDL: 25.8 mg/dL (ref 0.0–40.0)

## 2019-11-30 LAB — CBC
HCT: 43.2 % (ref 39.0–52.0)
Hemoglobin: 14.4 g/dL (ref 13.0–17.0)
MCHC: 33.4 g/dL (ref 30.0–36.0)
MCV: 89.9 fl (ref 78.0–100.0)
Platelets: 185 10*3/uL (ref 150.0–400.0)
RBC: 4.8 Mil/uL (ref 4.22–5.81)
RDW: 13.8 % (ref 11.5–15.5)
WBC: 4.7 10*3/uL (ref 4.0–10.5)

## 2019-11-30 LAB — COMPREHENSIVE METABOLIC PANEL
ALT: 17 U/L (ref 0–53)
AST: 21 U/L (ref 0–37)
Albumin: 4 g/dL (ref 3.5–5.2)
Alkaline Phosphatase: 123 U/L — ABNORMAL HIGH (ref 39–117)
BUN: 19 mg/dL (ref 6–23)
CO2: 29 mEq/L (ref 19–32)
Calcium: 9.1 mg/dL (ref 8.4–10.5)
Chloride: 103 mEq/L (ref 96–112)
Creatinine, Ser: 0.77 mg/dL (ref 0.40–1.50)
GFR: 100.76 mL/min (ref >60.00)
Glucose, Bld: 95 mg/dL (ref 70–99)
Potassium: 3.9 mEq/L (ref 3.5–5.1)
Sodium: 139 mEq/L (ref 135–145)
Total Bilirubin: 0.3 mg/dL (ref 0.2–1.2)
Total Protein: 6.5 g/dL (ref 6.0–8.3)

## 2019-11-30 LAB — PSA: PSA: 2 ng/mL (ref 0.10–4.00)

## 2019-11-30 LAB — HEMOGLOBIN A1C: Hgb A1c MFr Bld: 6 % (ref 4.6–6.5)

## 2019-11-30 MED ORDER — ATORVASTATIN CALCIUM 10 MG PO TABS: ORAL_TABLET | ORAL | 3 refills | Status: DC

## 2019-11-30 MED ORDER — ZOLPIDEM TARTRATE 10 MG PO TABS: ORAL_TABLET | ORAL | 0 refills | Status: DC

## 2019-11-30 MED ORDER — AMITRIPTYLINE HCL 25 MG PO TABS: ORAL_TABLET | ORAL | 3 refills | Status: DC

## 2019-11-30 MED ORDER — BUTALBITAL-APAP-CAFFEINE 50-325-40 MG PO TABS: ORAL_TABLET | ORAL | 0 refills | Status: DC

## 2019-11-30 MED ORDER — PAROXETINE HCL 30 MG PO TABS: 30.0000 mg | ORAL_TABLET | Freq: Every day | ORAL | 0 refills | Status: DC

## 2019-11-30 NOTE — Telephone Encounter (Signed)
Zolpidem no longer covered by Pt's insurance: preferred alternatives: Belsomra, and Ramelteon. Please advise.

## 2019-11-30 NOTE — Telephone Encounter (Signed)
PA denied. Medication is a high risk medication for those over 67 years of age and is not covered by [employer] plan. Preferred alternatives:   Cambia Cataflam (diclofenac potassium) or diclofenac sodium DR Ketoprofen Nalfon (fenoprofen)

## 2019-11-30 NOTE — Telephone Encounter (Signed)
PA initiated via Covermymeds; KEY: [id number]. Awaiting determination.

## 2020-01-21 NOTE — Progress Notes (Signed)
NEUROLOGY CONSULTATION NOTE  Brian [name] MRN: [id number] DOB: 1950-12-20  Referring provider: Lamar Blinks, MD Primary care provider: Lamar Blinks, MD  Reason for consult:  headache  HISTORY OF PRESENT ILLNESS: Brian [name] is a 69 year old left-handed white male with PAF who presents for headache.  History supplemented by referring provider note.  He has had headaches since his late 75s.  They are usually 3 to 5/10 bitemporal non-throbbing headaches, worse laying down and better when upright.  No nausea, photophobia, phonophobia, osmophobia, vomiting, visual disturbance, numbness or weakness.  Hunger may be a trigger.  They usually last 1 to 3 hours.  He had 9 headaches in last 30 days  History of childhood allergies, which he grew out of. In 2019, a stool collapsed under him and fell on back of his head on concrete floor.  No loss of consciousness or concussion symptoms.   Eye exam has been okay 1/2 cup of coffee daily (may be decaf) Drinks about 2 quarts of water daily No routine exercise. Left knee and hand arthritis.  Takes Aleve daily.   Sleep is good.  He takes Ambien or amitriptyline Once in a while he notes a very mild tremor when using either hand. His father had daily headaches for the last 20 years of his life.  His daughter has headaches.  Current NSAIDS:  ASA 81mg  daily; naproxen Current analgesics:  Fioricet (as needed); Tylenol Current triptans:  none Current ergotamine:  none Current anti-emetic:  none Current muscle relaxants:  none Current anti-anxiolytic:  none Current sleep aide:  Ambien 10mg  (rarely); amitriptyline 50mg  QHS Current Antihypertensive medications:  none Current Antidepressant medications:  Amitriptyline 50mg  QHS (insomnia); paroxetine 30mg  daily Current Anticonvulsant medications:  none Current anti-CGRP:  none Current Vitamins/Herbal/Supplements:  MVI Current Antihistamines/Decongestants:  none Other therapy:   none Hormone/birth control:  none  Past NSAIDS:  ibuprofen Past analgesics:  Fioricet w/codeine Past abortive triptans:  none Past abortive ergotamine:  none Past muscle relaxants:  none Past anti-emetic:  none Past antihypertensive medications:  none Past antidepressant medications:  none Past anticonvulsant medications:  none Past anti-CGRP:  none Past vitamins/Herbal/Supplements:  none Past antihistamines/decongestants:  none Other past therapies:  none   CBC and CMP from March were unremarkable.   PAST MEDICAL HISTORY: Past Medical History:  Diagnosis Date  . Depression   . Duodenitis 10/10/2015  . PAF (paroxysmal atrial fibrillation) ([facility]) Jan 2017   lone AF in setting of sepsis  . Sepsis secondary to UTI ([place]) 10/10/2015    PAST SURGICAL HISTORY: Past Surgical History:  Procedure Laterality Date  . APPENDECTOMY    . CARDIOVASCULAR STRESS TEST  01/22/2014   GXT - Ex 10 min (10.9 METs), HR 164 = 103% MPHR. No ST-T wave chagnes or Sx.  LOW RISK  --[facility] TM Score 10   . VASECTOMY      MEDICATIONS: Current Outpatient Medications on File Prior to Visit  Medication Sig Dispense Refill  . amitriptyline (ELAVIL) 25 MG tablet TAKE 2 TABLETS BY MOUTH AT BEDTIME AS NEEDED SLEEP. 180 tablet 3  . aspirin 81 MG tablet Take 81 mg by mouth daily.    Marland Kitchen atorvastatin (LIPITOR) 10 MG tablet TAKE 1 TABLET BY MOUTH EVERY DAY. 90 tablet 3  . butalbital-acetaminophen-caffeine (FIORICET) 50-325-40 MG tablet TAKE 1 TABLET BY MOUTH EVERY 6 HOURS AS NEEDED FOR HEADACHE. DO NOT TAKE WITH ADDITIONAL TYLENOL. 30 tablet 0  . Multiple Vitamin (MULTIVITAMIN) tablet Take 1 tablet by mouth daily.    Marland Kitchen  naproxen sodium (ALEVE) 220 MG tablet Take 220 mg by mouth 2 (two) times daily as needed.    Marland Kitchen PARoxetine (PAXIL) 30 MG tablet Take 1 tablet (30 mg total) by mouth daily. 90 tablet 0  . zolpidem (AMBIEN) 10 MG tablet TAKE 1/2 TABLET BY MOUTH AT BEDTIME occasionally FOR insomnia 30 tablet 0   No current  facility-administered medications on file prior to visit.    ALLERGIES: Allergies  Allergen Reactions  . Sulfa Antibiotics     childhood    FAMILY HISTORY: Family History  Problem Relation Age of Onset  . Cancer Mother   . Cancer Father   . Heart disease Father   . Stroke Father   . Cancer Maternal Grandmother   . Stroke Maternal Grandfather   . Coronary artery disease Other   . Cancer Other   . Stroke Other   . Arrhythmia Other     SOCIAL HISTORY: Social History   Socioeconomic History  . Marital status: Married    Spouse name: Muath Hallam  . Number of children: 2  . Years of education: College  . Highest education level: Not on file  Occupational History  . Occupation: retired bus Psychiatric nurse  . Smoking status: Never Smoker  . Smokeless tobacco: Never Used  Substance and Sexual Activity  . Alcohol use: No    Alcohol/week: 0.0 standard drinks  . Drug use: No  . Sexual activity: Yes    Partners: Female  Other Topics Concern  . Not on file  Social History Narrative   Lives with his wife, a Teacher, early years/pre.   Exercise- silver sneakers   Social Determinants of Health   Financial Resource Strain: Low Risk   . Difficulty of Paying Living Expenses: Not hard at all  Food Insecurity: No Food Insecurity  . Worried About Programme researcher, broadcasting/film/video in the Last Year: Never true  . Ran Out of Food in the Last Year: Never true  Transportation Needs: No Transportation Needs  . Lack of Transportation (Medical): No  . Lack of Transportation (Non-Medical): No  Physical Activity:   . Days of Exercise per Week:   . Minutes of Exercise per Session:   Stress:   . Feeling of Stress :   Social Connections:   . Frequency of Communication with Friends and Family:   . Frequency of Social Gatherings with Friends and Family:   . Attends Religious Services:   . Active Member of Clubs or Organizations:   . Attends Banker Meetings:   Marland Kitchen Marital Status:   Intimate  Partner Violence:   . Fear of Current or Ex-Partner:   . Emotionally Abused:   Marland Kitchen Physically Abused:   . Sexually Abused:      PHYSICAL EXAM: Blood pressure (!) 136/95, pulse (!) 105, resp. rate 18, height 5\' 10"  (1.778 m), weight 171 lb (77.6 kg), SpO2 97 %. General: No acute distress.  Patient appears well-groomed.   Head:  Normocephalic/atraumatic Eyes:  fundi examined but not visualized Neck: supple, no paraspinal tenderness, full range of motion Back: No paraspinal tenderness Heart: regular rate and rhythm Lungs: Clear to auscultation bilaterally. Vascular: No carotid bruits. Neurological Exam: Mental status: alert and oriented to person, place, and time, recent and remote memory intact, fund of knowledge intact, attention and concentration intact, speech fluent and not dysarthric, language intact. Cranial nerves: CN I: not tested CN II: pupils equal, round and reactive to light, visual fields intact CN III, IV, VI:  full range of motion, no nystagmus, no ptosis CN V: facial sensation intact CN VII: upper and lower face symmetric CN VIII: hearing intact CN IX, X: gag intact, uvula midline CN XI: sternocleidomastoid and trapezius muscles intact CN XII: tongue midline Bulk & Tone: normal, no fasciculations. Motor:  5/5 throughout  Sensation:  Pinprick and vibration sensation intact.  Deep Tendon Reflexes:  2+ throughout, toes downgoing.   Finger to nose testing:  Without dysmetria.   Heel to shin:  Without dysmetria.   Gait:  Normal station and stride.  Able to turn and tandem walk. Romberg negative.  IMPRESSION: Episodic tension-type headache, not intractable.  Neurologic exam unremarkable.  Ongoing for over a decade.  I do not suspect serious secondary intracranial abnormality.  PLAN: 1.  Increase amitriptyline to 75mg  at bedtime.  We can increase to 100mg  at bedtime in 6 to 8 weeks if needed. 2.  Limit use of pain relievers to no more than 2 days out of week to prevent  risk of rebound or medication-overuse headache (Fioricet to no more than 1 day out of week). 3.  Caffeine cessation 4.  Increase water intake 5.  Do not skip meals. 6.  Follow up in 4 months.   Thank you for allowing me to take part in the care of this patient.  , DO  CC: , MD

## 2020-01-22 ENCOUNTER — Other Ambulatory Visit: Payer: Self-pay

## 2020-01-22 ENCOUNTER — Encounter: Payer: Self-pay | Admitting: Neurology

## 2020-01-22 ENCOUNTER — Ambulatory Visit (INDEPENDENT_AMBULATORY_CARE_PROVIDER_SITE_OTHER): Payer: Medicare Other | Admitting: Neurology

## 2020-01-22 VITALS — BP 136/95 | HR 105 | Resp 18 | Ht 70.0 in | Wt 171.0 lb

## 2020-01-22 DIAGNOSIS — G44219 Episodic tension-type headache, not intractable: Secondary | ICD-10-CM | POA: Diagnosis not present

## 2020-01-22 MED ORDER — AMITRIPTYLINE HCL 25 MG PO TABS: 75.0000 mg | ORAL_TABLET | Freq: Every day | ORAL | 3 refills | Status: DC

## 2020-01-22 NOTE — Patient Instructions (Signed)
1.  Increase amitriptyline to 75mg  at bedtime.  We can increase dose in 6 to 8 weeks if needed. 2.  Limit use of pain relievers to no more than 2 days out of week to prevent risk of rebound or medication-overuse headache.  Limit use of the Fioricet to no more than 1 day per week. 3.  Continue headache diary 4.  Increase exercise 5.  Increase water intake (64 oz water daily) 6.  Do not skip meals 7.  Get proper sleep   Tension Headache, Adult A tension headache is pain, pressure, or aching in your head. Tension headaches can last from 30 minutes to several days. Follow these instructions at home: Managing pain  Take over-the-counter and prescription medicines only as told by your doctor.  When you have a headache, lie down in a dark, quiet room.  If told, put ice on your head and neck: ? Put ice in a plastic bag. ? Place a towel between your skin and the bag. ? Leave the ice on for 20 minutes, 2-3 times a day.  If told, put heat on the back of your neck. Do this as often as your doctor tells you to. Use the kind of heat that your doctor recommends, such as a moist heat pack or a heating pad. ? Place a towel between your skin and the heat. ? Leave the heat on for 20-30 minutes. ? Remove the heat if your skin turns bright red. Eating and drinking  Eat meals on a regular schedule.  Watch how much alcohol you drink: ? If you are a woman and are not pregnant, do not drink more than 1 drink a day. ? If you are a man, do not drink more than 2 drinks a day.  Drink enough fluid to keep your pee (urine) pale yellow.  Do not use a lot of caffeine, or stop using caffeine. Lifestyle  Get enough sleep. Get 7-9 hours of sleep each night. Or get the amount of sleep that your doctor tells you to.  At bedtime, remove all electronic devices from your room. Examples of electronic devices are computers, phones, and tablets.  Find ways to lessen your stress. Some things that can lessen stress  are: ? Exercise. ? Deep breathing. ? Yoga. ? Music. ? Positive thoughts.  Sit up straight. Do not tighten (tense) your muscles.  Do not use any products that have nicotine or tobacco in them, such as cigarettes and e-cigarettes. If you need help quitting, ask your doctor. General instructions   Keep all follow-up visits as told by your doctor. This is important.  Avoid things that can bring on headaches. Keep a journal to find out if certain things bring on headaches. For example, write down: ? What you eat and drink. ? How much sleep you get. ? Any change to your diet or medicines. Contact a doctor if:  Your headache does not get better.  Your headache comes back.  You have a headache and sounds, light, or smells bother you.  You feel sick to your stomach (nauseous) or you throw up (vomit).  Your stomach hurts. Get help right away if:  You suddenly get a very bad headache along with any of these: ? A stiff neck. ? Feeling sick to your stomach. ? Throwing up. ? Feeling weak. ? Trouble seeing. ? Feeling short of breath. ? A rash. ? Feeling unusually sleepy. ? Trouble speaking. ? Pain in your eye or ear. ? Trouble walking or  balancing. ? Feeling like you will pass out (faint). ? Passing out. Summary  A tension headache is pain, pressure, or aching in your head.  Tension headaches can last from 30 minutes to several days.  Lifestyle changes and medicines may help relieve pain. This information is not intended to replace advice given to you by your health care provider. Make sure you discuss any questions you have with your health care provider. Document Revised: 07/01/2019 Document Reviewed: 12/14/2016 Elsevier Patient Education  [place].

## 2020-05-26 ENCOUNTER — Encounter: Payer: Self-pay | Admitting: Family Medicine

## 2020-05-28 DIAGNOSIS — R7303 Prediabetes: Secondary | ICD-10-CM | POA: Insufficient documentation

## 2020-05-28 NOTE — Progress Notes (Addendum)
[facility] [place], [place], [place] [address] [phone]  Date:  05/30/2020   Name:  Brian [name]   DOB:  1951/01/20   MRN:  [id number]  PCP:  Darreld Mclean, MD    Chief Complaint: 6 month follow up   History of Present Illness:  Philip Kotlyar is a 69 y.o. very pleasant male patient who presents with the following:  Pt with history of A fib, dyslipidemia and depression, here today for a 6 month follow-up visit  Last seen by myself  Pt recently contacted me with concern about some sx of weakness which occurred while on a skeet shooting trip in [place] august -he sent me a lengthy narrative of the exact details of this day, which will be scanned to the chart In a nutshell, he was out on a sheet shooting outing- walking around in the heat.  He became unusually exhausted, sweating and lightheaded.  He did not have any chest pain, but did feel somewhat short of breath.  He is not sure if he just overheated or if this could indicate cardiac disease This has not occurred since -he is feeling back to normal  Since that time he has been doing his normal activities including yard work and not having any CP or SOB   Colon UTD Declines flu shot today covid done-we discussed booster dose Most recent labs in March of this year- alk phos slightly elevated He notes he is seeing [facility] Halls for Dr Maureen Ralphs- they are doing some steroid injections for his left knee OA Last week they did a fluid drain and injected steroids- helpful.  For the time being they are not looking at surgery  He has been to see Dr Tomi Likens for his headaches- dx with tension HA They tried increasing his amitriptyline dose at bedtime- however this seemed to make him worse He decreased his dose again to 50 mg and is doing ok with this  He feels content with his current HA management plan, and has seen improvement   Most recent EKG on chart from  2017 Low risk ETT in 2015  He needs an optometry rec-  He has an appt with Dr Ellyn Hack next month  Lab Results  Component Value Date   HGBA1C 6.0 11/30/2019     Patient Active Problem List   Diagnosis Date Noted  . Prediabetes 05/28/2020  . BMI 25.0-25.9,adult 11/11/2015  . Family history of coronary artery disease in father 10/25/2015  . Paroxysmal atrial fibrillation ([facility]) 10/11/2015  . Dyslipidemia 10/10/2015  . Depression     Past Medical History:  Diagnosis Date  . Depression   . Duodenitis 10/10/2015  . PAF (paroxysmal atrial fibrillation) ([facility]) Jan 2017   lone AF in setting of sepsis  . Sepsis secondary to UTI ([facility]) 10/10/2015    Past Surgical History:  Procedure Laterality Date  . APPENDECTOMY    . CARDIOVASCULAR STRESS TEST  01/22/2014   GXT - Ex 10 min (10.9 METs), HR 164 = 103% MPHR. No ST-T wave chagnes or Sx.  LOW RISK  --[facility] TM Score 10   . VASECTOMY      Social History   Tobacco Use  . Smoking status: Never Smoker  . Smokeless tobacco: Never Used  Vaping Use  . Vaping Use: Never used  Substance Use Topics  . Alcohol use: No    Alcohol/week: 0.0 standard drinks  . Drug use: No  Family History  Problem Relation Age of Onset  . Cancer Mother   . Cancer Father   . Heart disease Father   . Stroke Father   . Cancer Maternal Grandmother   . Stroke Maternal Grandfather   . Coronary artery disease Other   . Cancer Other   . Stroke Other   . Arrhythmia Other     Allergies  Allergen Reactions  . Sulfa Antibiotics     childhood    Medication list has been reviewed and updated.  Current Outpatient Medications on File Prior to Visit  Medication Sig Dispense Refill  . amitriptyline (ELAVIL) 25 MG tablet Take 3 tablets (75 mg total) by mouth at bedtime. (Patient taking differently: Take 50 mg by mouth at bedtime. ) 90 tablet 3  . aspirin 81 MG tablet Take 81 mg by mouth daily.    Marland Kitchen atorvastatin (LIPITOR) 10 MG tablet TAKE 1 TABLET BY MOUTH  EVERY DAY. 90 tablet 3  . Multiple Vitamin (MULTIVITAMIN) tablet Take 1 tablet by mouth daily.    Marland Kitchen PARoxetine (PAXIL) 30 MG tablet Take 1 tablet (30 mg total) by mouth daily. 90 tablet 0  . zolpidem (AMBIEN) 10 MG tablet TAKE 1/2 TABLET BY MOUTH AT BEDTIME occasionally FOR insomnia 30 tablet 0   No current facility-administered medications on file prior to visit.    Review of Systems:  As per HPI- otherwise negative.   Physical Examination: Vitals:   05/30/20 0936  BP: 118/82  Pulse: 68  Resp: 16  SpO2: 98%   Vitals:   05/30/20 0936  Weight: 168 lb (76.2 kg)  Height: 5' 10"  (1.778 m)   Body mass index is 24.11 kg/m. Ideal Body Weight: Weight in (lb) to have BMI = 25: 173.9  GEN: no acute distress.  Normal weight, looks well HEENT: Atraumatic, Normocephalic.  Ears and Nose: No external deformity. CV: RRR, No M/G/R. No JVD. No thrill. No extra heart sounds. PULM: CTA B, no wheezes, crackles, rhonchi. No retractions. No resp. distress. No accessory muscle use. ABD: S, NT, ND EXTR: No c/c/e PSYCH: Normally interactive. Conversant.   EKG: SR, normal EKG Compared with tracing from 2017 no significant change noted  Assessment and Plan: Exercise intolerance - Plan: EKG 12-Lead  Prediabetes - Plan: Hemoglobin A1c  Elevated alkaline phosphatase level - Plan: Comprehensive metabolic panel   Patient here today to discuss recent episode of exercise intolerance that occurred about 1 month ago. He has been worried that this could indicate cardiac disease, he actually already has an appoint with his cardio scheduled for later this month EKG today is reassuring, plan to have him see cardiology as planned A1c, c-Met pending Discussed immunizations- encouraged flu shot and covid booster Plan for 6 month follow-up visit   This visit occurred during the SARS-CoV-2 public health emergency.  Safety protocols were in place, including screening questions prior to the visit, additional  usage of staff PPE, and extensive cleaning of exam room while observing appropriate contact time as indicated for disinfecting solutions.    Signed Lamar Blinks, MD  Addendum 9/14, received his labs as below Message to patient A1c is stable to improved  Results for orders placed or performed in visit on 05/30/20  Comprehensive metabolic panel  Result Value Ref Range   Glucose, Bld 81 65 - 99 mg/dL   BUN 21 7 - 25 mg/dL   Creat 0.85 0.70 - 1.25 mg/dL   BUN/Creatinine Ratio NOT APPLICABLE 6 - 22 (calc)   Sodium  139 135 - 146 mmol/L   Potassium 4.1 3.5 - 5.3 mmol/L   Chloride 103 98 - 110 mmol/L   CO2 31 20 - 32 mmol/L   Calcium 9.2 8.6 - 10.3 mg/dL   Total Protein 6.3 6.1 - 8.1 g/dL   Albumin 4.0 3.6 - 5.1 g/dL   Globulin 2.3 1.9 - 3.7 g/dL (calc)   AG Ratio 1.7 1.0 - 2.5 (calc)   Total Bilirubin 0.4 0.2 - 1.2 mg/dL   Alkaline phosphatase (APISO) 115 35 - 144 U/L   AST 15 10 - 35 U/L   ALT 15 9 - 46 U/L  Hemoglobin A1c  Result Value Ref Range   Hgb A1c MFr Bld 5.9 (H) <5.7 % of total Hgb   Mean Plasma Glucose 123 (calc)   eAG (mmol/L) 6.8 (calc)

## 2020-05-29 NOTE — Patient Instructions (Addendum)
Good to see you again today!  You might see Dr Lorin Picket at [facility] to follow-up with cardiology as planned- let me know if any more episodes in the meantime!   I will be in touch with your labs asap

## 2020-05-30 ENCOUNTER — Other Ambulatory Visit: Payer: Self-pay

## 2020-05-30 ENCOUNTER — Encounter: Payer: Self-pay | Admitting: Family Medicine

## 2020-05-30 ENCOUNTER — Ambulatory Visit (INDEPENDENT_AMBULATORY_CARE_PROVIDER_SITE_OTHER): Payer: Medicare Other | Admitting: Family Medicine

## 2020-05-30 VITALS — BP 118/82 | HR 68 | Resp 16 | Ht 70.0 in | Wt 168.0 lb

## 2020-05-30 DIAGNOSIS — R748 Abnormal levels of other serum enzymes: Secondary | ICD-10-CM | POA: Diagnosis not present

## 2020-05-30 DIAGNOSIS — R6889 Other general symptoms and signs: Secondary | ICD-10-CM | POA: Diagnosis not present

## 2020-05-30 DIAGNOSIS — R7303 Prediabetes: Secondary | ICD-10-CM

## 2020-05-31 ENCOUNTER — Encounter: Payer: Self-pay | Admitting: Family Medicine

## 2020-05-31 LAB — COMPREHENSIVE METABOLIC PANEL
AG Ratio: 1.7 (calc) (ref 1.0–2.5)
ALT: 15 U/L (ref 9–46)
AST: 15 U/L (ref 10–35)
Albumin: 4 g/dL (ref 3.6–5.1)
Alkaline phosphatase (APISO): 115 U/L (ref 35–144)
BUN: 21 mg/dL (ref 7–25)
CO2: 31 mmol/L (ref 20–32)
Calcium: 9.2 mg/dL (ref 8.6–10.3)
Chloride: 103 mmol/L (ref 98–110)
Creat: 0.85 mg/dL (ref 0.70–1.25)
Globulin: 2.3 g/dL (calc) (ref 1.9–3.7)
Glucose, Bld: 81 mg/dL (ref 65–99)
Potassium: 4.1 mmol/L (ref 3.5–5.3)
Sodium: 139 mmol/L (ref 135–146)
Total Bilirubin: 0.4 mg/dL (ref 0.2–1.2)
Total Protein: 6.3 g/dL (ref 6.1–8.1)

## 2020-05-31 LAB — HEMOGLOBIN A1C
Hgb A1c MFr Bld: 5.9 % of total Hgb — ABNORMAL HIGH (ref <5.7)
Mean Plasma Glucose: 123 (calc)
eAG (mmol/L): 6.8 (calc)

## 2020-06-02 NOTE — Progress Notes (Signed)
NEUROLOGY FOLLOW UP OFFICE NOTE  Brian [name] [id number]  HISTORY OF PRESENT ILLNESS: Brian [name] is a 69 year old left-handed white male with PAF who follows up for headache.  UPDATE: Increased amitriptyline back in May.  Stopped Aleve. He noticed some incresed headache frequency so he decreased dose back to 50mg .  He has had gradual reduction in headache frequency and has had no headaches over past 28 days. Current NSAIDS:  ASA 81mg  daily; naproxen Current analgesics:  Excedrin Migraine Current triptans:  none Current ergotamine:  none Current anti-emetic:  none Current muscle relaxants:  none Current anti-anxiolytic:  none Current sleep aide:  Ambien 10mg  (rarely); amitriptyline 50mg  QHS Current Antihypertensive medications:  none Current Antidepressant medications:  Amitriptyline 50mg  at bedtime; paroxetine 30mg  daily Current Anticonvulsant medications:  none Current anti-CGRP:  none Current Vitamins/Herbal/Supplements:  MVI Current Antihistamines/Decongestants:  none Other therapy:  none Hormone/birth control:  none  HISTORY: He has had headaches since his late 34s.  They are usually 3 to 5/10 bitemporal non-throbbing headaches, worse laying down and better when upright.  No nausea, photophobia, phonophobia, osmophobia, vomiting, visual disturbance, numbness or weakness.  Hunger may be a trigger.  They usually last 1 to 3 hours.  He had 9 headaches in last 30 days  History of childhood allergies, which he grew out of. In 2019, a stool collapsed under him and fell on back of his head on concrete floor.  No loss of consciousness or concussion symptoms.   Eye exam has been okay 1/2 cup of coffee daily (may be decaf) Drinks about 2 quarts of water daily No routine exercise. Left knee and hand arthritis.  Takes Aleve daily.   Sleep is good.  He takes Ambien or amitriptyline Once in a while he notes a very mild tremor when using either hand. His father had daily  headaches for the last 20 years of his life.  His daughter has headaches.  Past NSAIDS:  ibuprofen Past analgesics:  Fioricet w/codeine Past abortive triptans:  none Past abortive ergotamine:  none Past muscle relaxants:  none Past anti-emetic:  none Past antihypertensive medications:  none Past antidepressant medications:  none Past anticonvulsant medications:  none Past anti-CGRP:  none Past vitamins/Herbal/Supplements:  none Past antihistamines/decongestants:  none Other past therapies:  none  PAST MEDICAL HISTORY: Past Medical History:  Diagnosis Date   Depression    Duodenitis 10/10/2015   PAF (paroxysmal atrial fibrillation) ([facility]) Jan 2017   lone AF in setting of sepsis   Sepsis secondary to UTI ([facility]) 10/10/2015    MEDICATIONS: Current Outpatient Medications on File Prior to Visit  Medication Sig Dispense Refill   amitriptyline (ELAVIL) 25 MG tablet Take 3 tablets (75 mg total) by mouth at bedtime. (Patient taking differently: Take 50 mg by mouth at bedtime. ) 90 tablet 3   aspirin 81 MG tablet Take 81 mg by mouth daily.     atorvastatin (LIPITOR) 10 MG tablet TAKE 1 TABLET BY MOUTH EVERY DAY. 90 tablet 3   Multiple Vitamin (MULTIVITAMIN) tablet Take 1 tablet by mouth daily.     PARoxetine (PAXIL) 30 MG tablet Take 1 tablet (30 mg total) by mouth daily. 90 tablet 0   zolpidem (AMBIEN) 10 MG tablet TAKE 1/2 TABLET BY MOUTH AT BEDTIME occasionally FOR insomnia 30 tablet 0   No current facility-administered medications on file prior to visit.    ALLERGIES: Allergies  Allergen Reactions   Sulfa Antibiotics     childhood    FAMILY  HISTORY: Family History  Problem Relation Age of Onset   Cancer Mother    Cancer Father    Heart disease Father    Stroke Father    Cancer Maternal Grandmother    Stroke Maternal Grandfather    Coronary artery disease Other    Cancer Other    Stroke Other    Arrhythmia Other    SOCIAL HISTORY: Social History     Socioeconomic History   Marital status: Married    Spouse name: Lorenza Winkleman   Number of children: 2   Years of education: College   Highest education level: Not on file  Occupational History   Occupation: retired Midwife  Tobacco Use   Smoking status: Never Smoker   Smokeless tobacco: Never Used  Building services engineer Use: Never used  Substance and Sexual Activity   Alcohol use: No    Alcohol/week: 0.0 standard drinks   Drug use: No   Sexual activity: Yes    Partners: Female  Other Topics Concern   Not on file  Social History Narrative   Lives with his wife, a Teacher, early years/pre.   Exercise- silver sneakers   Left handed   Two story home   Social Determinants of Health   Financial Resource Strain: Low Risk    Difficulty of Paying Living Expenses: Not hard at all  Food Insecurity: No Food Insecurity   Worried About Programme researcher, broadcasting/film/video in the Last Year: Never true   Ran Out of Food in the Last Year: Never true  Transportation Needs: No Transportation Needs   Lack of Transportation (Medical): No   Lack of Transportation (Non-Medical): No  Physical Activity:    Days of Exercise per Week: Not on file   Minutes of Exercise per Session: Not on file  Stress:    Feeling of Stress : Not on file  Social Connections:    Frequency of Communication with Friends and Family: Not on file   Frequency of Social Gatherings with Friends and Family: Not on file   Attends Religious Services: Not on file   Active Member of Clubs or Organizations: Not on file   Attends Banker Meetings: Not on file   Marital Status: Not on file  Intimate Partner Violence:    Fear of Current or Ex-Partner: Not on file   Emotionally Abused: Not on file   Physically Abused: Not on file   Sexually Abused: Not on file    PHYSICAL EXAM: Blood pressure 123/85, pulse 100, height 5\' 9"  (1.753 m), weight 169 lb 3.2 oz (76.7 kg), SpO2 96 %. General: No acute distress.   Patient appears well-groomed.   Head:  Normocephalic/atraumatic Eyes:  Fundi examined but not visualized Neck: supple, no paraspinal tenderness, full range of motion Heart:  Regular rate and rhythm Lungs:  Clear to auscultation bilaterally Back: No paraspinal tenderness Neurological Exam: alert and oriented to person, place, and time. Attention span and concentration intact, recent and remote memory intact, fund of knowledge intact.  Speech fluent and not dysarthric, language intact.  CN II-XII intact. Bulk and tone normal, muscle strength 5/5 throughout.  Sensation to light touch, temperature and vibration intact.  Deep tendon reflexes 2+ throughout, toes downgoing.  Finger to nose and heel to shin testing intact.  Gait normal, Romberg negative.  IMPRESSION: Tension-type headache, not intractable  PLAN: 1.  For preventative management, nortriptyline 50mg  at bedtime 2.  For abortive therapy, Excedrin 3.  Limit use of pain relievers to  no more than 2 days out of week to prevent risk of rebound or medication-overuse headache. 4.  Keep headache diary 5.  Exercise, hydration, caffeine cessation, sleep hygiene, monitor for and avoid triggers 6.  Follow up 6 months   Shon Millet, DO  CC: Abbe Amsterdam, MD

## 2020-06-06 ENCOUNTER — Other Ambulatory Visit: Payer: Self-pay

## 2020-06-06 ENCOUNTER — Ambulatory Visit (INDEPENDENT_AMBULATORY_CARE_PROVIDER_SITE_OTHER): Payer: Medicare Other | Admitting: Neurology

## 2020-06-06 ENCOUNTER — Encounter: Payer: Self-pay | Admitting: Neurology

## 2020-06-06 VITALS — BP 123/85 | HR 100 | Ht 69.0 in | Wt 169.2 lb

## 2020-06-06 DIAGNOSIS — G44219 Episodic tension-type headache, not intractable: Secondary | ICD-10-CM

## 2020-06-06 NOTE — Patient Instructions (Signed)
Continue nortriptyline 50mg  at bedtime Use Excedrin as needed.  Limit use of pain relievers to no more than 2 days out of week to prevent risk of rebound or medication-overuse headache. Follow up in 6 months

## 2020-06-15 ENCOUNTER — Other Ambulatory Visit: Payer: Self-pay

## 2020-06-15 ENCOUNTER — Ambulatory Visit (INDEPENDENT_AMBULATORY_CARE_PROVIDER_SITE_OTHER): Payer: Medicare Other | Admitting: Cardiology

## 2020-06-15 ENCOUNTER — Encounter: Payer: Self-pay | Admitting: *Deleted

## 2020-06-15 ENCOUNTER — Encounter: Payer: Self-pay | Admitting: Cardiology

## 2020-06-15 VITALS — BP 130/80 | HR 99 | Temp 97.7°F | Ht 70.0 in | Wt 168.0 lb

## 2020-06-15 DIAGNOSIS — Z8249 Family history of ischemic heart disease and other diseases of the circulatory system: Secondary | ICD-10-CM

## 2020-06-15 DIAGNOSIS — Z9189 Other specified personal risk factors, not elsewhere classified: Secondary | ICD-10-CM | POA: Diagnosis not present

## 2020-06-15 DIAGNOSIS — E785 Hyperlipidemia, unspecified: Secondary | ICD-10-CM | POA: Diagnosis not present

## 2020-06-15 DIAGNOSIS — R6889 Other general symptoms and signs: Secondary | ICD-10-CM

## 2020-06-15 DIAGNOSIS — I480 Paroxysmal atrial fibrillation: Secondary | ICD-10-CM | POA: Diagnosis not present

## 2020-06-15 NOTE — Progress Notes (Signed)
Primary Care Provider: [name], MD Cardiologist: Bryan Lemma, MD Electrophysiologist: None  Clinic Note: Chief Complaint  Patient presents with   Follow-up    not seen for years; had an episode of what sounded like heat exhaustion   Atrial Fibrillation    Has no sensation of rapid heart rate   HPI:    Brian [name] is a 69 y.o. male with a PMH notable for PAF (had A. fib RVR in the setting of GI viral duodenitis back in 2017) who is being seen today for evaluation of EXERCISE FATIGUE and NEAR SYNCOPE at the request of Copland, Gwenlyn Found, MD  Brian [name] was last seen here on November 29, 2015--CHA2DS2-VASc score was 0 at that time and he had not had any further breakthrough episodes of A. fib.  We decided not to treat with DOAC.  Use baby aspirin.  Nonischemic stress test.Plan was for annual follow-up - lost to f/u.  Recent Hospitalizations: none   He was seen on 9/13 by Dr. Dallas Schimke in f/u to a My Chart letter summarizing events of the Spring-Summer of 2021: (See letter attached to 9/9 Patient Message) --> noting more exertional fatigue (worse during heat) & 1 episode of extreme exhaustion & near syncope. -->   Reviewed  CV studies:    The following studies were reviewed today: (if available, images/films reviewed: From Epic Chart or Care Everywhere)  No recent studies.   Interval History:   Brian [name] is here today at the request of his PCP for cardiac evaluation noting that since spring of this year he has been noticing that he gets winded easier doing manual labor.  This is made worse with doing work outside in the hot weather.  He usually manages this by taking frequent breaks and drinking water as well as pacing himself.  He usually just slows down, catch his breath and then moves on forward.  Despite this he does acknowledge that this is a change in his stamina enough for him to notice--he consider this may be simply "getting older "until he had an  episode on May 04, 2020 that concerned him enough to notify his medical team. (He sent the letter to Dr. Dallas Schimke that triggered this follow-up visit with me.)   Summary from his letter: May 05, 2020-while participating in a [employer] involving walking around the 1/2 mile path while shooting it skeet at 14 different stations.  Relatively stable walking route, weather was not overly hot, most in the 80s humid.  He had a normal breakfast and felt to be reasonably hydrated.  Roughly halfway through the course he felt exceptionally hot and noticed that he was sweating significantly.  His energy level was drained.  After the fifth station, he stopped participating in simply watch.  When he sat down in the shade he started to feel little better, but after 2 more stations could go on no further.  He felt lightheaded dizzy with profuse sweating and almost passed out. ->  Simply walking back to the base Was difficult having to stop at each location in the shade.  It took roughly 10 minutes of sitting in the shade and drinking water for him to feel better and another 20 minutes for him to feel as though he had stopped sweating and feeling mostly recovered. ==> He never did pass out.  Denied any chest tightness or pressure.  Since he felt better after cooling off, he did not seek medical attention.  He simply  noted feeling fatigued the rest the day but was able to return to light activities.  No further episodes, but it did bring him concern enough to seek cardiac evaluation.  Since this episode, Brian [name] has been doing fairly well.  He is able to do his baseline activities now but still notes that he is energy level is not as good as it used to be.  He has spells where he feels much better than others.  No further lightheadedness or dizziness.  No syncope or near syncope.  No CHF symptoms and no angina.  Interestingly, during this episode he did not necessarily feel any irregular heartbeats or  palpitations.  CV Review of Symptoms (Summary): positive for - dyspnea on exertion and Exercise intolerance as noted above.  1 near syncopal episode described above. negative for - edema, irregular heartbeat, orthopnea, palpitations, paroxysmal nocturnal dyspnea, rapid heart rate, shortness of breath or No further episodes of syncope or near syncope, no TIA amaurosis fugax.  No claudication.  The patient does not have symptoms concerning for COVID-19 infection (fever, chills, cough, or new shortness of breath).   REVIEWED OF SYSTEMS   Pertinent positives and negatives as noted above. Review of Systems  Constitutional: Positive for malaise/fatigue (Exercise intolerance).  HENT: Negative for congestion and nosebleeds.   Respiratory: Positive for shortness of breath (As noted above).   Gastrointestinal: Negative for blood in stool and melena.  Genitourinary: Negative for hematuria.  Musculoskeletal: Negative for joint pain and myalgias.  Neurological: Negative for loss of consciousness.       Episode noted above  Psychiatric/Behavioral: Negative.    I have reviewed and (if needed) personally updated the patient's problem list, medications, allergies, past medical and surgical history, social and family history.   PAST MEDICAL HISTORY   Past Medical History:  Diagnosis Date   Depression    Duodenitis 10/10/2015   PAF (paroxysmal atrial fibrillation) (HCC) Jan 2017   lone AF in setting of sepsis   Sepsis secondary to UTI (HCC) 10/10/2015    PAST SURGICAL HISTORY   Past Surgical History:  Procedure Laterality Date   APPENDECTOMY     CARDIOVASCULAR STRESS TEST  01/22/2014   GXT - Ex 10 min (10.9 METs), HR 164 = 103% MPHR. No ST-T wave chagnes or Sx.  LOW RISK  --[facility] TM Score 10    HOLTER MONITOR  10/2015   Mostly normal sinus rhythm. Recommended avoiding stimulants, energy drinks and decongestants. No signs of A. fib.Marland Kitchen. PVCs noted in pairs, singles, bigeminy and trigeminy. No A.  fib.   TRANSTHORACIC ECHOCARDIOGRAM  02/2014   Normal LV size and function.  EF 68%.  Mild MR.  Mild TR.  No pulmonary hypertension.  Blood normal.   VASECTOMY      Immunization History  Administered Date(s) Administered   Influenza Split 06/08/2010, 07/18/2012   PFIZER SARS-COV-2 Vaccination 10/09/2019, 10/30/2019   Pneumococcal Conjugate-13 11/18/2017   Pneumococcal Polysaccharide-23 11/24/2018   Tdap 06/08/2010   Zoster 02/26/2013   Zoster Recombinat (Shingrix) 04/24/2019, 07/31/2019  - due for booster  MEDICATIONS/ALLERGIES   Current Meds  Medication Sig   amitriptyline (ELAVIL) 25 MG tablet Take 3 tablets (75 mg total) by mouth at bedtime. (Patient taking differently: Take 50 mg by mouth at bedtime. )   aspirin 81 MG tablet Take 81 mg by mouth daily.   atorvastatin (LIPITOR) 10 MG tablet TAKE 1 TABLET BY MOUTH EVERY DAY.   Multiple Vitamin (MULTIVITAMIN) tablet Take 1 tablet by mouth daily.  PARoxetine (PAXIL) 30 MG tablet Take 1 tablet (30 mg total) by mouth daily.   zolpidem (AMBIEN) 10 MG tablet TAKE 1/2 TABLET BY MOUTH AT BEDTIME occasionally FOR insomnia    Allergies  Allergen Reactions   Sulfa Antibiotics     childhood    SOCIAL HISTORY/FAMILY HISTORY   Reviewed in Epic:  Pertinent findings:  Social History   Tobacco Use   Smoking status: Never Smoker   Smokeless tobacco: Never Used  Vaping Use   Vaping Use: Never used  Substance Use Topics   Alcohol use: No    Alcohol/week: 0.0 standard drinks   Drug use: No   Social History   Social History Narrative   He is married to [name] - who is a Teacher, early years/pre.      He is a retired Emergency planning/management officer, he has 2 school-aged grandchildren who are now 7 and 9.        He enjoys helping to take care of them, building furniture and volunteering with church and [employer].  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      Exercise- silver sneakers   Left handed   Two story home   Family History  Problem Relation Age of Onset   Cancer Mother    Cancer Father    Heart disease Father    Stroke Father    Cancer Maternal Grandmother    Stroke Maternal Grandfather    Coronary artery disease Other    Cancer Other    Stroke Other    Arrhythmia Other     OBJCTIVE -PE, EKG, labs   Wt Readings from Last 3 Encounters:  06/15/20 168 lb (76.2 kg)  06/06/20 169 lb 3.2 oz (76.7 kg)  05/30/20 168 lb (76.2 kg)    Physical Exam: BP 130/80    Pulse 99    Temp 97.7 F (36.5 C)    Ht 5\' 10"  (1.778 m)    Wt 168 lb (76.2 kg)    SpO2 94%    BMI 24.11 kg/m  Physical Exam Vitals reviewed.  Constitutional:      General: He is not in acute distress.    Appearance: Normal appearance. He is normal weight. He is not ill-appearing.  HENT:     Head: Normocephalic and atraumatic.  Neck:     Vascular: No carotid bruit, hepatojugular reflux or JVD.  Cardiovascular:     Rate and Rhythm: Normal rate and regular rhythm.  No extrasystoles are present.    Pulses: Normal pulses and intact distal pulses.     Heart sounds: Normal heart sounds. No murmur heard.  No friction rub. No gallop.   Pulmonary:     Effort: Pulmonary effort is normal. No respiratory distress.     Breath sounds: Normal breath sounds.  Chest:     Chest wall: No tenderness.  Abdominal:     General: Abdomen is flat. Bowel sounds are normal. There is no distension.     Palpations: Abdomen is soft. There is no mass.     Comments: No HSM or bruit  Musculoskeletal:        General: No swelling. Normal range of motion.     Cervical back: Normal range of motion and neck supple.  Neurological:  General: No focal deficit present.     Mental Status: He is alert and oriented to person, place, and time.     Cranial Nerves: No  cranial nerve deficit (Grossly normal).  Psychiatric:        Mood and Affect: Mood normal.        Behavior: Behavior normal.        Thought Content: Thought content normal.        Judgment: Judgment normal.      Adult ECG Report  Rate: 99 ;  Rhythm: normal sinus rhythm and Normal axis, intervals and durations.;   Narrative Interpretation: Borderline tachycardia, otherwise normal.  Recent Labs:    Lab Results  Component Value Date   CHOL 153 11/30/2019   HDL 50.10 11/30/2019   LDLCALC 77 11/30/2019   TRIG 129.0 11/30/2019   CHOLHDL 3 11/30/2019   Lab Results  Component Value Date   CREATININE 0.85 05/30/2020   BUN 21 05/30/2020   NA 139 05/30/2020   K 4.1 05/30/2020   CL 103 05/30/2020   CO2 31 05/30/2020   Lab Results  Component Value Date   TSH 1.58 01/05/2016    ASSESSMENT/PLAN    Problem List Items Addressed This Visit    Paroxysmal atrial fibrillation (HCC) (Chronic)    Is difficult to tell if he had any recurrent symptoms because he was not overly symptomatic at the time.  This seemed to be a triggered episode.  However now with his exercise intolerance and perhaps one episode of near syncope, I think is not unreasonable to reassess to see if there are any occult episodes of episodes of A. Fib.  Now that he is over 65, if he were to show any recurrent episodes, we probably would need to consider DOAC as opposed to aspirin.  Plan: 30-day monitor      Relevant Orders   EKG 12-Lead (Completed)   CARDIAC EVENT MONITOR   Dyslipidemia (Chronic)    Followed by PCP.  Lipids look pretty good on atorvastatin at low-dose.      Family history of coronary artery disease in father (Chronic)    Not really having any anginal symptoms, but he is having exercise intolerance.  His blood pressures pretty well controlled, as are his lipids.  He has prediabetes listed, but seems to be relatively healthy otherwise.  At follow-up visit, we can discuss potential stress test  versus coronary calcium score for further stratification.      History of heat exhaustion - Primary    The episode he described happening back in August sounds pretty consistent with classic heat exhaustion.  He says he ate fine leading up to this episode and said he was adequately hydrated, however we talked out in detail, he had not probably had enough to drink leading into it and was not drinking enough during the episode.  I recommended that he ensures that he stays fully hydrated when he does his activities this requires hydration before (even the night before) any type of outdoor activities as well as hydration during the activities.  To ensure that there was no A. fib involved, we will do a 30-day monitor.      Exercise intolerance    Difficult to tell what this is related to.  It could be that he has intermittently having A. fib, could just be that he is deconditioned.  He is usually very active though. Plan: We will start off with a 30-day event monitor, I would like to  see how he does and if there is any occult A. fib.  Once this is determined, we can consider the possibility of stress test if there is any other concerning symptoms.  Probably the best time to stress test would be to consider CPX.  (Cardiopulmonary exercise test)         COVID-19 Education: The signs and symptoms of COVID-19 were discussed with the patient and how to seek care for testing (follow up with PCP or arrange E-visit).   The importance of social distancing and COVID-19 vaccination was discussed today. 1 min The patient is practicing social distancing & Masking.   I spent a total of with the patient spent in direct patient consultation.  Additional time spent with chart review  / charting (studies, outside notes, etc): 22  Total Time: 62 min  Current medicines are reviewed at length with the patient today.  (+/- concerns) none  Notice: This dictation was prepared with Dragon dictation along  with smaller phrase technology. Any transcriptional errors that result from this process are unintentional and may not be corrected upon review.  Patient Instructions / Medication Changes & Studies & Tests Ordered   Patient Instructions  Medication Instructions:  NO CHANGES *If you need a refill on your cardiac medications before your next appointment, please call your pharmacy*   Lab Work: NOT NEEDED   Testing/Procedures:  WILL BE MAILED TO YOU IN 7-10 DAYS\Your physician has recommended that you wear an event monitor- 30 DAYS. Event monitors are medical devices that record the hearts electrical activity. Doctors most often [place] these monitors to diagnose arrhythmias. Arrhythmias are problems with the speed or rhythm of the heartbeat. The monitor is a small, portable device. You can wear one while you do your normal daily activities. This is usually used to diagnose what is causing palpitations/syncope (passing out).    Follow-Up: At [facility], you and your health needs are our priority.  As part of our continuing mission to provide you with exceptional heart care, we have created designated Provider Care Teams.  These Care Teams include your primary Cardiologist (physician) and Advanced Practice Providers (APPs -  Physician Assistants and Nurse Practitioners) who all work together to provide you with the care you need, when you need it.     Your next appointment:   2 month(s)  The format for your next appointment:   In Person  Provider:   Bryan Lemma, MD   Other Instructions   [employer] Cardiac Event Monitor Instructions Your physician has requested you wear your cardiac event monitor for __30___ days, (1-30). [employer] may call or text to confirm a shipping address. The monitor will be sent to a land address via UPS. [employer] will not ship a monitor to a PO BOX. It typically takes 3-5 days to receive your monitor after it has been enrolled. [employer] will assist  with USPS tracking if your package is delayed. The telephone number for [employer] is [phone]. Once you have received your monitor, please review the enclosed instructions. Instruction tutorials can also be viewed under help and settings on the enclosed cell phone. Your monitor has already been registered assigning a specific monitor serial # to you.  Applying the monitor Remove cell phone from case and turn it on. The cell phone works as IT consultant and needs to be within [employer] of you at all times. The cell phone will need to be charged on a daily basis. We recommend you plug the cell phone  into the enclosed charger at your bedside table every night.  Monitor batteries: You will receive two monitor batteries labelled #1 and #2. These are your recorders. Plug battery #2 onto the second connection on the enclosed charger. Keep one battery on the charger at all times. This will keep the monitor battery deactivated. It will also keep it fully charged for when you need to switch your monitor batteries. A small light will be blinking on the battery emblem when it is charging. The light on the battery emblem will remain on when the battery is fully charged.  Open package of a Monitor strip. Insert battery #1 into black hood on strip and gently squeeze monitor battery onto connection as indicated in instruction booklet. Set aside while preparing skin.  Choose location for your strip, vertical or horizontal, as indicated in the instruction booklet. Shave to remove all hair from location. There cannot be any lotions, oils, powders, or colognes on skin where monitor is to be applied. Wipe skin clean with enclosed Saline wipe. Dry skin completely.  Peel paper labeled #1 off the back of the Monitor strip exposing the adhesive. Place the monitor on the chest in the vertical or horizontal position shown in the instruction booklet. One arrow on the monitor strip must be pointing upward.  Carefully remove paper labeled #2, attaching remainder of strip to your skin. Try not to create any folds or wrinkles in the strip as you apply it.  Firmly press and release the circle in the center of the monitor battery. You will hear a small beep. This is turning the monitor battery on. The heart emblem on the monitor battery will light up every 5 seconds if the monitor battery in turned on and connected to the patient securely. Do not push and hold the circle down as this turns the monitor battery off. The cell phone will locate the monitor battery. A screen will appear on the cell phone checking the connection of your monitor strip. This may read poor connection initially but change to good connection within the next minute. Once your monitor accepts the connection you will hear a series of 3 beeps followed by a climbing crescendo of beeps. A screen will appear on the cell phone showing the two monitor strip placement options. Touch the picture that demonstrates where you applied the monitor strip.  Your monitor strip and battery are waterproof. You are able to shower, bathe, or swim with the monitor on. They just ask you do not submerge deeper than 3 feet underwater. We recommend removing the monitor if you are swimming in a lake, river, or ocean.  Your monitor battery will need to be switched to a fully charged monitor battery approximately once a week. The cell phone will alert you of an action which needs to be made.  On the cell phone, tap for details to reveal connection status, monitor battery status, and cell phone battery status. The green dots indicates your monitor is in good status. A red dot indicates there is something that needs your attention.  To record a symptom, click the circle on the monitor battery. In 30-60 seconds a list of symptoms will appear on the cell phone. Select your symptom and tap save. Your monitor will record a sustained or significant arrhythmia  regardless of you clicking the button. Some patients do not feel the heart rhythm irregularities. [employer] will notify us of any serious or critical events.  Refer to instruction booklet for instructions on switching batteries,  changing strips, the Do not disturb or Pause features, or any additional questions.  Call [employer] at [phone], to confirm your monitor is transmitting and record your baseline. They will answer any questions you may have regarding the monitor instructions at that time.  Returning the monitor to [employer] Place all equipment back into blue box. Peel off strip of paper to expose adhesive and close box securely. There is a prepaid UPS shipping label on this box. Drop in a UPS drop box, or at a UPS facility like [employer]. You may also contact [employer] to arrange UPS to pick up monitor package at your home.    Studies Ordered:   Orders Placed This Encounter  Procedures   CARDIAC EVENT MONITOR   EKG 12-Lead     Bryan Lemma, M.D., M.S. Interventional Cardiologist   Pager # [phone] Phone # [phone] [address] [place], [place] [address]   Thank you for choosing Heartcare at [facility]!!

## 2020-06-15 NOTE — Progress Notes (Signed)
Patient ID: Brian [name], male   DOB: 09-27-1950, 69 y.o.   MRN: [id number] Patient enrolled for [employer] to ship a 30 day cardiac event monitor to his home.

## 2020-06-15 NOTE — Patient Instructions (Signed)
Medication Instructions:  NO CHANGES *If you need a refill on your cardiac medications before your next appointment, please call your pharmacy*   Lab Work: NOT NEEDED   Testing/Procedures:  WILL BE MAILED TO YOU IN 7-10 DAYS\Your physician has recommended that you wear an event monitor- 30 DAYS. Event monitors are medical devices that record the heart's electrical activity. Doctors most often [place] these monitors to diagnose arrhythmias. Arrhythmias are problems with the speed or rhythm of the heartbeat. The monitor is a small, portable device. You can wear one while you do your normal daily activities. This is usually used to diagnose what is causing palpitations/syncope (passing out).    Follow-Up: At [facility], you and your health needs are our priority.  As part of our continuing mission to provide you with exceptional heart care, we have created designated Provider Care Teams.  These Care Teams include your primary Cardiologist (physician) and Advanced Practice Providers (APPs -  Physician Assistants and Nurse Practitioners) who all work together to provide you with the care you need, when you need it.     Your next appointment:   2 month(s)  The format for your next appointment:   In Person  Provider:   Bryan Lemma, MD   Other Instructions   [employer] Cardiac Event Monitor Instructions Your physician has requested you wear your cardiac event monitor for __30___ days, (1-30). [employer] may call or text to confirm a shipping address. The monitor will be sent to a land address via UPS. [employer] will not ship a monitor to a PO BOX. It typically takes 3-5 days to receive your monitor after it has been enrolled. [employer] will assist with USPS tracking if your package is delayed. The telephone number for [employer] is [phone]. Once you have received your monitor, please review the enclosed instructions. Instruction tutorials can also be viewed under help and  settings on the enclosed cell phone. Your monitor has already been registered assigning a specific monitor serial # to you.  Applying the monitor Remove cell phone from case and turn it on. The cell phone works as IT consultant and needs to be within [employer] of you at all times. The cell phone will need to be charged on a daily basis. We recommend you plug the cell phone into the enclosed charger at your bedside table every night.  Monitor batteries: You will receive two monitor batteries labelled #1 and #2. These are your recorders. Plug battery #2 onto the second connection on the enclosed charger. Keep one battery on the charger at all times. This will keep the monitor battery deactivated. It will also keep it fully charged for when you need to switch your monitor batteries. A small light will be blinking on the battery emblem when it is charging. The light on the battery emblem will remain on when the battery is fully charged.  Open package of a Monitor strip. Insert battery #1 into black hood on strip and gently squeeze monitor battery onto connection as indicated in instruction booklet. Set aside while preparing skin.  Choose location for your strip, vertical or horizontal, as indicated in the instruction booklet. Shave to remove all hair from location. There cannot be any lotions, oils, powders, or colognes on skin where monitor is to be applied. Wipe skin clean with enclosed Saline wipe. Dry skin completely.  Peel paper labeled #1 off the back of the Monitor strip exposing the adhesive. Place the monitor on the chest in the vertical or horizontal  position shown in the instruction booklet. One arrow on the monitor strip must be pointing upward. Carefully remove paper labeled #2, attaching remainder of strip to your skin. Try not to create any folds or wrinkles in the strip as you apply it.  Firmly press and release the circle in the center of the monitor battery. You will hear a  small beep. This is turning the monitor battery on. The heart emblem on the monitor battery will light up every 5 seconds if the monitor battery in turned on and connected to the patient securely. Do not push and hold the circle down as this turns the monitor battery off. The cell phone will locate the monitor battery. A screen will appear on the cell phone checking the connection of your monitor strip. This may read poor connection initially but change to good connection within the next minute. Once your monitor accepts the connection you will hear a series of 3 beeps followed by a climbing crescendo of beeps. A screen will appear on the cell phone showing the two monitor strip placement options. Touch the picture that demonstrates where you applied the monitor strip.  Your monitor strip and battery are waterproof. You are able to shower, bathe, or swim with the monitor on. They just ask you do not submerge deeper than 3 feet underwater. We recommend removing the monitor if you are swimming in a lake, river, or ocean.  Your monitor battery will need to be switched to a fully charged monitor battery approximately once a week. The cell phone will alert you of an action which needs to be made.  On the cell phone, tap for details to reveal connection status, monitor battery status, and cell phone battery status. The green dots indicates your monitor is in good status. A red dot indicates there is something that needs your attention.  To record a symptom, click the circle on the monitor battery. In 30-60 seconds a list of symptoms will appear on the cell phone. Select your symptom and tap save. Your monitor will record a sustained or significant arrhythmia regardless of you clicking the button. Some patients do not feel the heart rhythm irregularities. [employer] will notify us of any serious or critical events.  Refer to instruction booklet for instructions on switching batteries, changing  strips, the Do not disturb or Pause features, or any additional questions.  Call [employer] at [phone], to confirm your monitor is transmitting and record your baseline. They will answer any questions you may have regarding the monitor instructions at that time.  Returning the monitor to [employer] Place all equipment back into blue box. Peel off strip of paper to expose adhesive and close box securely. There is a prepaid UPS shipping label on this box. Drop in a UPS drop box, or at a UPS facility like [employer]. You may also contact [employer] to arrange UPS to pick up monitor package at your home.

## 2020-06-19 ENCOUNTER — Encounter: Payer: Self-pay | Admitting: Cardiology

## 2020-06-19 DIAGNOSIS — R6889 Other general symptoms and signs: Secondary | ICD-10-CM | POA: Insufficient documentation

## 2020-06-19 DIAGNOSIS — Z9189 Other specified personal risk factors, not elsewhere classified: Secondary | ICD-10-CM | POA: Insufficient documentation

## 2020-06-19 NOTE — Assessment & Plan Note (Addendum)
Is difficult to tell if he had any recurrent symptoms because he was not overly symptomatic at the time.  This seemed to be a triggered episode.  However now with his exercise intolerance and perhaps one episode of near syncope, I think is not unreasonable to reassess to see if there are any occult episodes of episodes of A. Fib.  Now that he is over 67, if he were to show any recurrent episodes, we probably would need to consider DOAC as opposed to aspirin.  Plan: 30-day monitor

## 2020-06-19 NOTE — Assessment & Plan Note (Signed)
Difficult to tell what this is related to.  It could be that he has intermittently having A. fib, could just be that he is deconditioned.  He is usually very active though. Plan: We will start off with a 30-day event monitor, I would like to see how he does and if there is any occult A. fib.  Once this is determined, we can consider the possibility of stress test if there is any other concerning symptoms.  Probably the best time to stress test would be to consider CPX.  (Cardiopulmonary exercise test)

## 2020-06-19 NOTE — Assessment & Plan Note (Signed)
Followed by PCP.  Lipids look pretty good on atorvastatin at low-dose.

## 2020-06-19 NOTE — Assessment & Plan Note (Signed)
The episode he described happening back in August sounds pretty consistent with classic heat exhaustion.  He says he ate fine leading up to this episode and said he was adequately hydrated, however we talked out in detail, he had not probably had enough to drink leading into it and was not drinking enough during the episode.  I recommended that he ensures that he stays fully hydrated when he does his activities this requires hydration before (even the night before) any type of outdoor activities as well as hydration during the activities.  To ensure that there was no A. fib involved, we will do a 30-day monitor.

## 2020-06-19 NOTE — Assessment & Plan Note (Signed)
Not really having any anginal symptoms, but he is having exercise intolerance.  His blood pressures pretty well controlled, as are his lipids.  He has prediabetes listed, but seems to be relatively healthy otherwise.  At follow-up visit, we can discuss potential stress test versus coronary calcium score for further stratification.

## 2020-06-21 ENCOUNTER — Encounter: Payer: Self-pay | Admitting: Family Medicine

## 2020-06-25 ENCOUNTER — Ambulatory Visit (INDEPENDENT_AMBULATORY_CARE_PROVIDER_SITE_OTHER): Payer: Medicare Other

## 2020-06-25 DIAGNOSIS — I480 Paroxysmal atrial fibrillation: Secondary | ICD-10-CM | POA: Diagnosis not present

## 2020-07-15 ENCOUNTER — Other Ambulatory Visit: Payer: Self-pay | Admitting: Family Medicine

## 2020-07-15 DIAGNOSIS — F32A Depression, unspecified: Secondary | ICD-10-CM

## 2020-08-15 ENCOUNTER — Other Ambulatory Visit: Payer: Self-pay

## 2020-08-15 ENCOUNTER — Encounter: Payer: Self-pay | Admitting: Cardiology

## 2020-08-15 ENCOUNTER — Ambulatory Visit (INDEPENDENT_AMBULATORY_CARE_PROVIDER_SITE_OTHER): Payer: Medicare Other | Admitting: Cardiology

## 2020-08-15 ENCOUNTER — Encounter: Payer: Self-pay | Admitting: Family Medicine

## 2020-08-15 VITALS — BP 120/76 | HR 99 | Temp 97.3°F | Ht 68.0 in | Wt 172.8 lb

## 2020-08-15 DIAGNOSIS — E785 Hyperlipidemia, unspecified: Secondary | ICD-10-CM

## 2020-08-15 DIAGNOSIS — Z8249 Family history of ischemic heart disease and other diseases of the circulatory system: Secondary | ICD-10-CM

## 2020-08-15 DIAGNOSIS — I480 Paroxysmal atrial fibrillation: Secondary | ICD-10-CM

## 2020-08-15 DIAGNOSIS — Z9189 Other specified personal risk factors, not elsewhere classified: Secondary | ICD-10-CM

## 2020-08-15 NOTE — Patient Instructions (Addendum)
Medication Instructions:  No changes *If you need a refill on your cardiac medications before your next appointment, please call your pharmacy*   Lab Work: Not needed    Testing/Procedures: Not needed   Follow-Up: At [facility], you and your health needs are our priority.  As part of our continuing mission to provide you with exceptional heart care, we have created designated Provider Care Teams.  These Care Teams include your primary Cardiologist (physician) and Advanced Practice Providers (APPs -  Physician Assistants and Nurse Practitioners) who all work together to provide you with the care you need, when you need it.   Your next appointment:   12 month(s)  The format for your next appointment:   In Person  Provider:   Bryan Lemma, MD  Other Instruction Keep hydrated

## 2020-08-15 NOTE — Progress Notes (Signed)
Primary Care Provider: [name], MD Cardiologist: Bryan Lemma, MD Electrophysiologist: None  Clinic Note: Chief Complaint  Patient presents with  . Follow-up    Monitor results  . Near Syncope    No further episodes.   HPI:    Brian [name] is a 69 y.o. male with a PMH notable for PAF (had A. fib RVR in the setting of GI viral duodenitis back in 2017) who is being seen today for follow-up evaluation of EXERCISE FATIGUE and NEAR SYNCOPE at the request of Copland, Gwenlyn Found, MD  Prior to recent visit, he was seen on November 29, 2015--CHA2DS2-VASc score was 0 at that time and he had not had any further breakthrough episodes of A. fib.  We decided not to treat with DOAC.  Use baby aspirin.  Nonischemic stress test.Plan was for annual follow-up - lost to f/u  [name] was recently seen for reconsultation on June 15, 2020 at the request of Dr. Patsy Lager after an episode that occurred while competing in a skeet shoot competition in August..  May 05, 2020-while participating in a [employer] involving walking around the 1/2 mile path while shooting it skeet at 14 different stations.  Relatively stable walking route, weather was not overly hot, most in the 80s humid.  He had a normal breakfast and felt to be reasonably hydrated.  Roughly halfway through the course he felt exceptionally hot and noticed that he was sweating significantly.  His energy level was drained.  After the fifth station, he stopped participating in simply watch.  When he sat down in the shade he started to feel little better, but after 2 more stations could go on no further.  He felt lightheaded dizzy with profuse sweating and almost passed out. ->  Simply walking back to the base Was difficult having to stop at each location in the shade.  It took roughly 10 minutes of sitting in the shade and drinking water for him to feel better and another 20 minutes for him to feel as though he had  stopped sweating and feeling mostly recovered. ==> He never did pass out.  Denied any chest tightness or pressure.  Since he felt better after cooling off, he did not seek medical attention.  He simply noted feeling fatigued the rest the day but was able to return to light activities.--No further episodes.  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  He felt this may simply be related to his age and reduced stamina as a result.  Recent Hospitalizations: none   Reviewed  CV studies:    The following studies were reviewed today: (if available, images/films reviewed: From Epic Chart or Care Everywhere) . Event Monitor Recording (October 9 July 24, 2020): Overwhelmingly normal monitor results.  Majority rhythm was sinus (98%).  Max heart rate 159 bpm, minimum heart rate 64 bpm.  Average 93 bpm; rare PACs and very rare PVCs.  No couplets or triplets, bigeminy or trigeminy.  No bradycardia or pauses.  No A. fib, atrial flutter, PAT, SVT or VT.   Interval History:   Brian [name] is here today to follow-up the results of his monitor indicated that he is doing fairly well.  He has not had any further episodes of dyspnea.  He has made a conscious effort to sure that he is adequately hydrated, especially if he is going to be outside.  He is back to his routine levels activity with no chest  pain or pressure.  He is going a little slower than he used to go a year ago, but does not think that this is anything new for him.  No further episodes of irregular heartbeats palpitations or lightheadedness, syncope or near syncope.  CV Review of Symptoms (Summary): positive for - Maybe little exercise intolerance, but no significant dyspnea or chest discomfort. negative for - chest pain, dyspnea on exertion, edema, irregular heartbeat, orthopnea, palpitations, paroxysmal nocturnal dyspnea, rapid heart rate, shortness of breath or No further episodes of syncope or near syncope, no TIA  amaurosis fugax.  No claudication.  The patient Does Not have symptoms concerning for COVID-19 infection (fever, chills, cough, or new shortness of breath).   REVIEWED OF SYSTEMS   Pertinent positives and negatives as noted above. Review of Systems  Constitutional: Positive for malaise/fatigue (Mild exercise intolerance.  Better).  HENT: Negative for congestion and nosebleeds.   Respiratory: Negative for shortness of breath (As noted above).   Gastrointestinal: Negative for blood in stool and melena.  Genitourinary: Negative for hematuria.  Musculoskeletal: Negative for joint pain and myalgias.  Neurological: Negative for loss of consciousness.       Episode noted above  Psychiatric/Behavioral: Negative.    I have reviewed and (if needed) personally updated the patient's problem list, medications, allergies, past medical and surgical history, social and family history.   PAST MEDICAL HISTORY   Past Medical History:  Diagnosis Date  . Depression   . Duodenitis 10/10/2015  . PAF (paroxysmal atrial fibrillation) (HCC) Jan 2017   lone AF in setting of sepsis  . Sepsis secondary to UTI (HCC) 10/10/2015    PAST SURGICAL HISTORY   Past Surgical History:  Procedure Laterality Date  . APPENDECTOMY    . CARDIOVASCULAR STRESS TEST  01/22/2014   GXT - Ex 10 min (10.9 METs), HR 164 = 103% MPHR. No ST-T wave chagnes or Sx.  LOW RISK  --[facility] TM Score 10   . HOLTER MONITOR  10/2015   Mostly normal sinus rhythm. Recommended avoiding stimulants, energy drinks and decongestants. No signs of A. fib.Marland Kitchen PVCs noted in pairs, singles, bigeminy and trigeminy. No A. fib.  . TRANSTHORACIC ECHOCARDIOGRAM  02/2014   Normal LV size and function.  EF 68%.  Mild MR.  Mild TR.  No pulmonary hypertension.  Blood normal.  . VASECTOMY      Immunization History  Administered Date(s) Administered  . Influenza Split 06/08/2010, 07/18/2012  . PFIZER SARS-COV-2 Vaccination 10/09/2019, 10/30/2019, 06/21/2020  .  Pneumococcal Conjugate-13 11/18/2017  . Pneumococcal Polysaccharide-23 11/24/2018  . Tdap 06/08/2010  . Zoster 02/26/2013  . Zoster Recombinat (Shingrix) 04/24/2019, 07/31/2019  - due for booster  MEDICATIONS/ALLERGIES   Current Meds  Medication Sig  . amitriptyline (ELAVIL) 25 MG tablet Take 3 tablets (75 mg total) by mouth at bedtime. (Patient taking differently: Take 50 mg by mouth at bedtime. )  . aspirin 81 MG tablet Take 81 mg by mouth daily.  Marland Kitchen atorvastatin (LIPITOR) 10 MG tablet TAKE 1 TABLET BY MOUTH EVERY DAY.  . Multiple Vitamin (MULTIVITAMIN) tablet Take 1 tablet by mouth daily.  Marland Kitchen PARoxetine (PAXIL) 30 MG tablet Take 1 tablet (30 mg total) by mouth daily.  Marland Kitchen zolpidem (AMBIEN) 10 MG tablet TAKE 1/2 TABLET BY MOUTH AT BEDTIME occasionally FOR insomnia    Allergies  Allergen Reactions  . Sulfa Antibiotics     childhood    SOCIAL HISTORY/FAMILY HISTORY   Reviewed in Epic:  Pertinent findings:  Social History  Tobacco Use  . Smoking status: Never Smoker  . Smokeless tobacco: Never Used  Vaping Use  . Vaping Use: Never used  Substance Use Topics  . Alcohol use: No    Alcohol/week: 0.0 standard drinks  . Drug use: No   Social History   Social History Narrative   He is married to [name] - who is a Teacher, early years/pre.      He is a retired Emergency planning/management officer, he has 2 school-aged grandchildren who are now 7 and 9.        He enjoys helping to take care of them, building furniture and volunteering with church and [employer].  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 Cancer Mother   . Cancer Father   . Heart disease Father   . Stroke Father   . Cancer Maternal  Grandmother   . Stroke Maternal Grandfather   . Coronary artery disease Other   . Cancer Other   . Stroke Other   . Arrhythmia Other     OBJCTIVE -PE, EKG, labs   Wt Readings from Last 3 Encounters:  08/15/20 172 lb 12.8 oz (78.4 kg)  06/15/20 168 lb (76.2 kg)  06/06/20 169 lb 3.2 oz (76.7 kg)    Physical Exam: BP 120/76   Pulse 99   Temp (!) 97.3 F (36.3 C)   Ht 5\' 8"  (1.727 m)   Wt 172 lb 12.8 oz (78.4 kg)   SpO2 96%   BMI 26.27 kg/m  Physical Exam Vitals reviewed.  Constitutional:      General: He is not in acute distress.    Appearance: Normal appearance. He is normal weight. He is not ill-appearing.  HENT:     Head: Normocephalic and atraumatic.  Neck:     Vascular: No carotid bruit, hepatojugular reflux or JVD.  Cardiovascular:     Rate and Rhythm: Normal rate and regular rhythm.  No extrasystoles are present.    Pulses: Normal pulses and intact distal pulses.     Heart sounds: Normal heart sounds. No murmur heard. No friction rub. No gallop.      Comments: Borderline tachycardic-however rates are less rapid on my exam than on vital signs. Pulmonary:     Effort: Pulmonary effort is normal. No respiratory distress.     Breath sounds: Normal breath sounds.  Chest:     Chest wall: No tenderness.  Musculoskeletal:        General: No swelling. Normal range of motion.     Cervical back: Normal range of motion and neck supple.  Neurological:     General: No focal deficit present.     Mental Status: He is alert and oriented to person, place, and time.     Cranial Nerves: No cranial nerve deficit (Grossly normal).  Psychiatric:        Mood and Affect: Mood normal.        Behavior: Behavior normal.        Thought Content: Thought content normal.        Judgment: Judgment normal.      Adult ECG Report N/a  Recent Labs:    Lab Results  Component Value Date   CHOL 153 11/30/2019   HDL 50.10 11/30/2019   LDLCALC 77 11/30/2019   TRIG 129.0 11/30/2019    CHOLHDL 3 11/30/2019   Lab Results  Component Value Date   CREATININE 0.85 05/30/2020   BUN 21 05/30/2020   NA 139 05/30/2020   K 4.1 05/30/2020   CL 103 05/30/2020   CO2 31 05/30/2020   Lab Results  Component Value Date   TSH 1.58 01/05/2016    ASSESSMENT/PLAN    Problem List Items Addressed This Visit    Paroxysmal atrial fibrillation (HCC) - Primary (Chronic)    No evidence of A. fib on his monitor.  No recurrent symptoms either.  His episode that he had the summer was clearly not related to A. fib. With no recurrent symptoms, we discussed whether or not we should start DOAC.  I think at this point at least aspirin is reasonable.  However next year, we probably would want to consider starting DOAC.  CHA2DS2-VASc score currently is 1 simply based on age.  He is not on any rate control or rhythm control agents.       Relevant Orders   EKG 12-Lead (Completed)   Dyslipidemia (Chronic)    Lipids look pretty well controlled based on his minimal risk factors.  Followed by PCP.  He is on low-dose atorvastatin.      Family history of coronary artery disease in father (Chronic)    No anginal symptoms.  No further exercise intolerance.  Monitor closely.  But for now lipid control and baby aspirin are reasonable.  Targeting LDL somewhere between 70-100-close to 70 is reasonable.      Relevant Orders   EKG 12-Lead (Completed)   History of heat exhaustion    I think we can clearly say that the episode he had the summer was clearly related to heat exhaustion.  We talked by the importance of adequate hydration including prehydration even the evening before outdoors activities such as that.  He clearly was volume depleted.  He does have a tendency to get tachycardic.  With him having A. fib, it is any more important.  If he is outside for longer period of time may want to consider even hydration with soft of a sports electrolyte replacement drinks such as Gatorade, Powerade or liquid  IV.      Relevant Orders   EKG 12-Lead (Completed)      COVID-19 Education: The signs and symptoms of COVID-19 were discussed with the patient and how to seek care for testing (follow up with PCP or arrange E-visit).   The importance of social distancing and COVID-19 vaccination was discussed today. The patient is practicing social distancing & Masking.   I spent a total of with the patient spent in direct patient consultation.  Additional time spent with chart review  / charting (studies, outside notes, etc): 12 Total Time:  Current medicines are reviewed at length with the patient today.  (+/- concerns) none  Notice: This dictation was prepared with Dragon dictation along with smaller phrase technology. Any transcriptional errors that result from this process are unintentional and may not be corrected upon review.  Patient Instructions / Medication Changes & Studies & Tests Ordered   Patient Instructions  Medication Instructions:  No changes *If you need a refill on your cardiac medications before your next appointment, please call your pharmacy*   Lab Work: Not needed  Testing/Procedures: Not needed   Follow-Up: At [facility], you and your health needs are our priority.  As part of our continuing mission to provide you with exceptional heart care, we have created designated Provider Care Teams.  These Care Teams include your primary Cardiologist (physician) and Advanced Practice Providers (APPs -  Physician Assistants and Nurse Practitioners) who all work together to provide you with the care you need, when you need it.   Your next appointment:   12 month(s)  The format for your next appointment:   In Person  Provider:   Bryan Lemmaavid Jaana Brodt, MD  Other Instruction Keep hydrated  Studies Ordered:   Orders Placed This Encounter  Procedures  . EKG 12-Lead     Bryan Lemmaavid Gerrica Cygan, M.D., M.S. Interventional Cardiologist   Pager #  [phone] Phone # [phone] [address] [place], [place] [address]   Thank you for choosing Heartcare at [facility]!!

## 2020-08-26 ENCOUNTER — Encounter: Payer: Self-pay | Admitting: Cardiology

## 2020-08-26 NOTE — Assessment & Plan Note (Signed)
I think we can clearly say that the episode he had the summer was clearly related to heat exhaustion.  We talked by the importance of adequate hydration including prehydration even the evening before outdoors activities such as that.  He clearly was volume depleted.  He does have a tendency to get tachycardic.  With him having A. fib, it is any more important.  If he is outside for longer period of time may want to consider even hydration with soft of a sports electrolyte replacement drinks such as Gatorade, Powerade or liquid IV.

## 2020-08-26 NOTE — Assessment & Plan Note (Signed)
Lipids look pretty well controlled based on his minimal risk factors.  Followed by PCP.  He is on low-dose atorvastatin.

## 2020-08-26 NOTE — Assessment & Plan Note (Signed)
No evidence of A. fib on his monitor.  No recurrent symptoms either.  His episode that he had the summer was clearly not related to A. fib. With no recurrent symptoms, we discussed whether or not we should start DOAC.  I think at this point at least aspirin is reasonable.  However next year, we probably would want to consider starting DOAC.  CHA2DS2-VASc score currently is 1 simply based on age.  He is not on any rate control or rhythm control agents.

## 2020-08-26 NOTE — Assessment & Plan Note (Signed)
No anginal symptoms.  No further exercise intolerance.  Monitor closely.  But for now lipid control and baby aspirin are reasonable.  Targeting LDL somewhere between 70-100-close to 70 is reasonable.

## 2020-09-08 ENCOUNTER — Other Ambulatory Visit: Payer: Self-pay | Admitting: Family Medicine

## 2020-09-08 DIAGNOSIS — E7800 Pure hypercholesterolemia, unspecified: Secondary | ICD-10-CM

## 2020-09-08 DIAGNOSIS — G4700 Insomnia, unspecified: Secondary | ICD-10-CM

## 2020-10-08 ENCOUNTER — Other Ambulatory Visit: Payer: Self-pay | Admitting: Family Medicine

## 2020-10-08 DIAGNOSIS — F32A Depression, unspecified: Secondary | ICD-10-CM

## 2020-11-29 NOTE — Progress Notes (Addendum)
[facility] [address] [place], [place] [address] [phone]  Date:  12/05/2020   Name:  Brian [name]   DOB:  1951-06-07   MRN:  [id number]  PCP:  Brian Cables, MD    Chief Complaint: Annual Exam   History of Present Illness:  Brian [name] is a 70 y.o. very pleasant male patient who presents with the following: Here today for a CPE  Last seen by myself 6 months ago - history of A fib, dyslipidemia and depression, prediabetes  At that time he had recently had an unusual episode with exertion but had cardiology visit already pending   Seen by Dr Brian [name] in November to discuss this episode   Paroxysmal atrial fibrillation (HCC) - Primary (Chronic)    No evidence of A. fib on his monitor.  No recurrent symptoms either.  His episode that he had the summer was clearly not related to A. fib. With no recurrent symptoms, we discussed whether or not we should start DOAC.  I think at this point at least aspirin is reasonable.  However next year, we probably would want to consider starting DOAC.  CHA2DS2-VASc score currently is 1 simply based on age.  He is not on any rate control or rhythm control agents.       Relevant Orders   EKG 12-Lead (Completed)   Dyslipidemia (Chronic)    Lipids look pretty well controlled based on his minimal risk factors.  Followed by PCP.  He is on low-dose atorvastatin.      Family history of coronary artery disease in father (Chronic)    No anginal symptoms.  No further exercise intolerance.  Monitor closely.  But for now lipid control and baby aspirin are reasonable.  Targeting LDL somewhere between 70-100-close to 70 is reasonable.      Relevant Orders   EKG 12-Lead (Completed)   History of heat exhaustion    I think we can clearly say that the episode he had the summer was clearly related to heat exhaustion.  We talked by the importance of adequate  hydration including prehydration even the evening before outdoors activities such as that.  He clearly was volume depleted.  He does have a tendency to get tachycardic.  With him having A. fib, it is any more important.  If he is outside for longer period of time may want to consider even hydration with soft of a sports electrolyte replacement drinks such as Gatorade, Powerade or liquid IV.      Relevant Orders   EKG 12-Lead (Completed)     Tetanus booster- due today -we will update Flu- he does not wish to do this year  Colon UTD- he will be seen in 6 years  covid and shingrix done  Lab Results  Component Value Date   HGBA1C 5.9 (H) 05/30/2020   Last filled ambien 11/30/19 lipitor paxil- 30 mg  He is taking amitriptyline most of the time around 8 PM-if he happens to be out he will skip it that day  Dr Brian [name] has been consulting on his HA- mostly lifestyle changes, he is being seen next week.  His HA have greatly decreased  He is using excedrin prn- he stopped fioricet and caffeine  His left knee is being seen by Dr Brian [name], he has done steroid shots  Most recent 9/21 He recently completed synvisc and had a good result  Steroids did not help as much  For now they are not thinking about possibly repeating Synvisc next year  He does some walking as much as he is able He is sleeping well His grands are 8 and 41 yo  He is not fasting this am   Overall he feels well.  Mood is good, no chest pain or shortness of breath   Patient Active Problem List   Diagnosis Date Noted  . History of heat exhaustion 06/19/2020  . Exercise intolerance 06/19/2020  . Prediabetes 05/28/2020  . BMI 25.0-25.9,adult 11/11/2015  . Family history of coronary artery disease in father 10/25/2015  . Paroxysmal atrial fibrillation (HCC) 10/11/2015  . Dyslipidemia 10/10/2015  . Depression     Past Medical History:  Diagnosis Date  . Depression   . Duodenitis 10/10/2015  . PAF (paroxysmal atrial  fibrillation) (HCC) Jan 2017   lone AF in setting of sepsis  . Sepsis secondary to UTI (HCC) 10/10/2015    Past Surgical History:  Procedure Laterality Date  . APPENDECTOMY    . CARDIOVASCULAR STRESS TEST  01/22/2014   GXT - Ex 10 min (10.9 METs), HR 164 = 103% MPHR. No ST-T wave chagnes or Sx.  LOW RISK  --[facility] TM Score 10   . HOLTER MONITOR  10/2015   Mostly normal sinus rhythm. Recommended avoiding stimulants, energy drinks and decongestants. No signs of A. fib.Marland Kitchen PVCs noted in pairs, singles, bigeminy and trigeminy. No A. fib.  . TRANSTHORACIC ECHOCARDIOGRAM  02/2014   Normal LV size and function.  EF 68%.  Mild MR.  Mild TR.  No pulmonary hypertension.  Blood normal.  . VASECTOMY      Social History   Tobacco Use  . Smoking status: Never Smoker  . Smokeless tobacco: Never Used  Vaping Use  . Vaping Use: Never used  Substance Use Topics  . Alcohol use: No    Alcohol/week: 0.0 standard drinks  . Drug use: No    Family History  Problem Relation Age of Onset  . Cancer Mother   . Cancer Father   . Heart disease Father   . Stroke Father   . Cancer Maternal Grandmother   . Stroke Maternal Grandfather   . Coronary artery disease Other   . Cancer Other   . Stroke Other   . Arrhythmia Other     Allergies  Allergen Reactions  . Sulfa Antibiotics     childhood    Medication list has been reviewed and updated.  Current Outpatient Medications on File Prior to Visit  Medication Sig Dispense Refill  . amitriptyline (ELAVIL) 25 MG tablet TAKE 2 TABLETS BY MOUTH AT BEDTIME AS NEEDED FOR SLEEP 180 tablet 3  . aspirin 81 MG tablet Take 81 mg by mouth daily.    Marland Kitchen atorvastatin (LIPITOR) 10 MG tablet TAKE 1 TABLET BY MOUTH EVERY DAY 90 tablet 3  . Multiple Vitamin (MULTIVITAMIN) tablet Take 1 tablet by mouth daily.    Marland Kitchen PARoxetine (PAXIL) 30 MG tablet TAKE 1 TABLET BY MOUTH DAILY 90 tablet 1  . zolpidem (AMBIEN) 10 MG tablet TAKE 1/2 TABLET BY MOUTH AT BEDTIME occasionally FOR  insomnia 30 tablet 0   No current facility-administered medications on file prior to visit.    Review of Systems:  As per HPI- otherwise negative.   Physical Examination: Vitals:   12/05/20 0816  BP: 124/82  Pulse: 92  Resp: 16  Temp: 97.9 F (36.6 C)  SpO2: 94%   Vitals:   12/05/20 0816  Weight: 169 lb (  76.7 kg)  Height: 5\' 8"  (1.727 m)   Body mass index is 25.7 kg/m. Ideal Body Weight: Weight in (lb) to have BMI = 25: 164.1  GEN: no acute distress.  Normal weight, looks good HEENT: Atraumatic, Normocephalic.  Ears and Nose: No external deformity. CV: RRR, No M/G/R. No JVD. No thrill. No extra heart sounds. PULM: CTA B, no wheezes, crackles, rhonchi. No retractions. No resp. distress. No accessory muscle use. ABD: S, NT, ND, +BS. No rebound. No HSM. EXTR: No c/c/e PSYCH: Normally interactive. Conversant.  Small wound on dorsum of left hand. Does not need wound care but will update tetanus Assessment and Plan: Physical exam  Prediabetes - Plan: Hemoglobin A1c  Paroxysmal atrial fibrillation (HCC) - Plan: CBC, Basic metabolic panel  Family history of coronary artery disease in father  Dyslipidemia - Plan: Lipid panel  Depression, unspecified depression type - Plan: PARoxetine (PAXIL) 30 MG tablet  Screening for prostate cancer - Plan: PSA  Insomnia, unspecified type - Plan: zolpidem (AMBIEN) 10 MG tablet  Open wound of left hand without foreign body, unspecified wound type, initial encounter - Plan: Td vaccine greater than or equal to 7yo preservative free IM  Physical exam today.  Encouraged healthy diet and weight management.  Encouraged exercise.  Labs are pending as above.  Refilled medications, updated tetanus Will plan further follow- up pending labs.  This visit occurred during the SARS-CoV-2 public health emergency.  Safety protocols were in place, including screening questions prior to the visit, additional usage of staff PPE, and extensive cleaning  of exam room while observing appropriate contact time as indicated for disinfecting solutions.    Signed , MD  Received his labs as below, message to patient  Results for orders placed or performed in visit on 12/05/20  CBC  Result Value Ref Range   WBC 4.8 4.0 - 10.5 K/uL   RBC 4.93 4.22 - 5.81 Mil/uL   Platelets 191.0 150.0 - 400.0 K/uL   Hemoglobin 14.4 13.0 - 17.0 g/dL   HCT 12/07/20 19.3 - 79.0 %   MCV 87.2 78.0 - 100.0 fl   MCHC 33.6 30.0 - 36.0 g/dL   RDW 24.0 97.3 - 53.2 %  Basic metabolic panel  Result Value Ref Range   Sodium 139 135 - 145 mEq/L   Potassium 3.9 3.5 - 5.1 mEq/L   Chloride 106 96 - 112 mEq/L   CO2 26 19 - 32 mEq/L   Glucose, Bld 73 70 - 99 mg/dL   BUN 18 6 - 23 mg/dL   Creatinine, Ser 99.2 0.40 - 1.50 mg/dL   GFR 4.26 83.41 mL/min   Calcium 9.4 8.4 - 10.5 mg/dL  Lipid panel  Result Value Ref Range   Cholesterol 153 0 - 200 mg/dL   Triglycerides >96.22 0.0 - 149.0 mg/dL   HDL 29.7 98.92 mg/dL   VLDL >11.94 0.0 - 17.4 mg/dL   LDL Cholesterol 80 0 - 99 mg/dL   Total CHOL/HDL Ratio 3    NonHDL 99.00   PSA  Result Value Ref Range   PSA 2.60 0.10 - 4.00 ng/mL  Hemoglobin A1c  Result Value Ref Range   Hgb A1c MFr Bld 6.1 4.6 - 6.5 %

## 2020-12-03 NOTE — Patient Instructions (Signed)
It was great to see you again today- I will be in touch with your labs Assuming all is well please see me in 6 months  Continue to work on exercise as you are able Tetanus booster today Stay tuned for recs abut 4th covid shot    Health Maintenance After Age 68 After age 43, you are at a higher risk for certain long-term diseases and infections as well as injuries from falls. Falls are a major cause of broken bones and head injuries in people who are older than age 76. Getting regular preventive care can help to keep you healthy and well. Preventive care includes getting regular testing and making lifestyle changes as recommended by your health care provider. Talk with your health care provider about:  Which screenings and tests you should have. A screening is a test that checks for a disease when you have no symptoms.  A diet and exercise plan that is right for you. What should I know about screenings and tests to prevent falls? Screening and testing are the best ways to find a health problem early. Early diagnosis and treatment give you the best chance of managing medical conditions that are common after age 19. Certain conditions and lifestyle choices may make you more likely to have a fall. Your health care provider may recommend:  Regular vision checks. Poor vision and conditions such as cataracts can make you more likely to have a fall. If you wear glasses, make sure to get your prescription updated if your vision changes.  Medicine review. Work with your health care provider to regularly review all of the medicines you are taking, including over-the-counter medicines. Ask your health care provider about any side effects that may make you more likely to have a fall. Tell your health care provider if any medicines that you take make you feel dizzy or sleepy.  Osteoporosis screening. Osteoporosis is a condition that causes the bones to get weaker. This can make the bones weak and cause them to  break more easily.  Blood pressure screening. Blood pressure changes and medicines to control blood pressure can make you feel dizzy.  Strength and balance checks. Your health care provider may recommend certain tests to check your strength and balance while standing, walking, or changing positions.  Foot health exam. Foot pain and numbness, as well as not wearing proper footwear, can make you more likely to have a fall.  Depression screening. You may be more likely to have a fall if you have a fear of falling, feel emotionally low, or feel unable to do activities that you used to do.  Alcohol use screening. Using too much alcohol can affect your balance and may make you more likely to have a fall. What actions can I take to lower my risk of falls? General instructions  Talk with your health care provider about your risks for falling. Tell your health care provider if: ? You fall. Be sure to tell your health care provider about all falls, even ones that seem minor. ? You feel dizzy, sleepy, or off-balance.  Take over-the-counter and prescription medicines only as told by your health care provider. These include any supplements.  Eat a healthy diet and maintain a healthy weight. A healthy diet includes low-fat dairy products, low-fat (lean) meats, and fiber from whole grains, beans, and lots of fruits and vegetables. Home safety  Remove any tripping hazards, such as rugs, cords, and clutter.  Install safety equipment such as grab bars in  bathrooms and safety rails on stairs.  Keep rooms and walkways well-lit. Activity  Follow a regular exercise program to stay fit. This will help you maintain your balance. Ask your health care provider what types of exercise are appropriate for you.  If you need a cane or walker, use it as recommended by your health care provider.  Wear supportive shoes that have nonskid soles.   Lifestyle  Do not drink alcohol if your health care provider tells  you not to drink.  If you drink alcohol, limit how much you have: ? 0-1 drink a day for women. ? 0-2 drinks a day for men.  Be aware of how much alcohol is in your drink. In the [place] one drink equals one typical bottle of beer (12 oz), one-half glass of wine (5 oz), or one shot of hard liquor (1 oz).  Do not use any products that contain nicotine or tobacco, such as cigarettes and e-cigarettes. If you need help quitting, ask your health care provider. Summary  Having a healthy lifestyle and getting preventive care can help to protect your health and wellness after age 71.  Screening and testing are the best way to find a health problem early and help you avoid having a fall. Early diagnosis and treatment give you the best chance for managing medical conditions that are more common for people who are older than age 47.  Falls are a major cause of broken bones and head injuries in people who are older than age 101. Take precautions to prevent a fall at home.  Work with your health care provider to learn what changes you can make to improve your health and wellness and to prevent falls. This information is not intended to replace advice given to you by your health care provider. Make sure you discuss any questions you have with your health care provider. Document Revised: 12/25/2018 Document Reviewed: 07/17/2017 Elsevier Patient Education  2021 [employer].

## 2020-12-05 ENCOUNTER — Other Ambulatory Visit: Payer: Self-pay

## 2020-12-05 ENCOUNTER — Encounter: Payer: Self-pay | Admitting: Family Medicine

## 2020-12-05 ENCOUNTER — Ambulatory Visit (INDEPENDENT_AMBULATORY_CARE_PROVIDER_SITE_OTHER): Payer: Medicare Other | Admitting: Family Medicine

## 2020-12-05 VITALS — BP 124/82 | HR 92 | Temp 97.9°F | Resp 16 | Ht 68.0 in | Wt 169.0 lb

## 2020-12-05 DIAGNOSIS — Z0000 Encounter for general adult medical examination without abnormal findings: Secondary | ICD-10-CM

## 2020-12-05 DIAGNOSIS — Z23 Encounter for immunization: Secondary | ICD-10-CM | POA: Diagnosis not present

## 2020-12-05 DIAGNOSIS — S61402A Unspecified open wound of left hand, initial encounter: Secondary | ICD-10-CM

## 2020-12-05 DIAGNOSIS — E785 Hyperlipidemia, unspecified: Secondary | ICD-10-CM | POA: Diagnosis not present

## 2020-12-05 DIAGNOSIS — Z8249 Family history of ischemic heart disease and other diseases of the circulatory system: Secondary | ICD-10-CM

## 2020-12-05 DIAGNOSIS — Z125 Encounter for screening for malignant neoplasm of prostate: Secondary | ICD-10-CM | POA: Diagnosis not present

## 2020-12-05 DIAGNOSIS — I480 Paroxysmal atrial fibrillation: Secondary | ICD-10-CM | POA: Diagnosis not present

## 2020-12-05 DIAGNOSIS — R7303 Prediabetes: Secondary | ICD-10-CM

## 2020-12-05 DIAGNOSIS — F32A Depression, unspecified: Secondary | ICD-10-CM

## 2020-12-05 DIAGNOSIS — G4700 Insomnia, unspecified: Secondary | ICD-10-CM

## 2020-12-05 LAB — CBC
HCT: 43 % (ref 39.0–52.0)
Hemoglobin: 14.4 g/dL (ref 13.0–17.0)
MCHC: 33.6 g/dL (ref 30.0–36.0)
MCV: 87.2 fl (ref 78.0–100.0)
Platelets: 191 10*3/uL (ref 150.0–400.0)
RBC: 4.93 Mil/uL (ref 4.22–5.81)
RDW: 13.9 % (ref 11.5–15.5)
WBC: 4.8 10*3/uL (ref 4.0–10.5)

## 2020-12-05 LAB — LIPID PANEL
Cholesterol: 153 mg/dL (ref 0–200)
HDL: 54.2 mg/dL (ref >39.00)
LDL Cholesterol: 80 mg/dL (ref 0–99)
NonHDL: 99
Total CHOL/HDL Ratio: 3
Triglycerides: 97 mg/dL (ref 0.0–149.0)
VLDL: 19.4 mg/dL (ref 0.0–40.0)

## 2020-12-05 LAB — BASIC METABOLIC PANEL
BUN: 18 mg/dL (ref 6–23)
CO2: 26 mEq/L (ref 19–32)
Calcium: 9.4 mg/dL (ref 8.4–10.5)
Chloride: 106 mEq/L (ref 96–112)
Creatinine, Ser: 0.85 mg/dL (ref 0.40–1.50)
GFR: 89.58 mL/min (ref >60.00)
Glucose, Bld: 73 mg/dL (ref 70–99)
Potassium: 3.9 mEq/L (ref 3.5–5.1)
Sodium: 139 mEq/L (ref 135–145)

## 2020-12-05 LAB — PSA: PSA: 2.6 ng/mL (ref 0.10–4.00)

## 2020-12-05 LAB — HEMOGLOBIN A1C: Hgb A1c MFr Bld: 6.1 % (ref 4.6–6.5)

## 2020-12-05 MED ORDER — ZOLPIDEM TARTRATE 10 MG PO TABS: ORAL_TABLET | ORAL | 0 refills | Status: DC

## 2020-12-05 MED ORDER — PAROXETINE HCL 30 MG PO TABS: 30.0000 mg | ORAL_TABLET | Freq: Every day | ORAL | 3 refills | Status: DC

## 2020-12-12 NOTE — Progress Notes (Signed)
NEUROLOGY FOLLOW UP OFFICE NOTE  Brian [name] [id number]  Assessment/Plan:   Tension-type headache  1.  Headache prevention:  Amitriptyline 50mg  at bedtime 2.  Headache rescue:  Excedrin 3.  Limit use of pain relievers to no more than 2 days out of week to prevent risk of rebound or medication-overuse headache. 4.  Keep headache diary 5.  Follow up 9 months  Subjective:  Brian [name] is a 5 year oldleft-handed white male with PAF who follows up for headache.  UPDATE: Headaches are moderate, last 30 minutes with 2 Excedrin and occurs about once a week Current NSAIDS:ASA 81mg  daily; naproxen Current analgesics:Excedrin Migraine Current triptans:none Current ergotamine:none Current anti-emetic:none Current muscle relaxants:none Current anti-anxiolytic:none Current sleep aide:Ambien 10mg (rarely); amitriptyline 50mg  QHS Current Antihypertensive medications:none Current Antidepressant medications:Amitriptyline 50mg  at bedtime; paroxetine 30mg  daily Current Anticonvulsant medications:none Current anti-CGRP:none Current Vitamins/Herbal/Supplements:MVI Current Antihistamines/Decongestants:none Other therapy:none Hormone/birth control:none  No caffeine  HISTORY: He has had headaches since his late 71s. They are usually 3 to 5/10 bitemporal non-throbbing headaches, worse laying down and better when upright. No nausea, photophobia, phonophobia, osmophobia, vomiting, visual disturbance, numbness or weakness. Hunger may be a trigger. They usually last 1 to 3 hours. He had 9 headaches in last 30 days  History of childhood allergies, which he grew out of. In 2019, a stool collapsed under him and fell on back of his head on concrete floor. No loss of consciousness or concussion symptoms.  Eye exam has been okay 1/2 cup of coffee daily (may be decaf) Drinks about 2 quarts of water daily No routine exercise. Left knee and hand  arthritis. Takes Aleve daily.  Sleep is good. He takes Ambien or amitriptyline Once in a while he notes a very mild tremor when using either hand. His father had daily headaches for the last 20 years of his life. His daughter has headaches.  Past NSAIDS:ibuprofen Past analgesics:Fioricet w/codeine Past abortive triptans:none Past abortive ergotamine:none Past muscle relaxants:none Past anti-emetic:none Past antihypertensive medications:none Past antidepressant medications:none Past anticonvulsant medications:none Past anti-CGRP:none Past vitamins/Herbal/Supplements:none Past antihistamines/decongestants:none Other past therapies:none  PAST MEDICAL HISTORY: Past Medical History:  Diagnosis Date  . Depression   . Duodenitis 10/10/2015  . PAF (paroxysmal atrial fibrillation) (HCC) Jan 2017   lone AF in setting of sepsis  . Sepsis secondary to UTI (HCC) 10/10/2015    MEDICATIONS: Current Outpatient Medications on File Prior to Visit  Medication Sig Dispense Refill  . amitriptyline (ELAVIL) 25 MG tablet TAKE 2 TABLETS BY MOUTH AT BEDTIME AS NEEDED FOR SLEEP 180 tablet 3  . aspirin 81 MG tablet Take 81 mg by mouth daily.    atorvastatin (LIPITOR) 10 MG tablet TAKE 1 TABLET BY MOUTH EVERY DAY 90 tablet 3  . Multiple Vitamin (MULTIVITAMIN) tablet Take 1 tablet by mouth daily.    49s PARoxetine (PAXIL) 30 MG tablet Take 1 tablet (30 mg total) by mouth daily. 90 tablet 3  . zolpidem (AMBIEN) 10 MG tablet TAKE 1/2 TABLET BY MOUTH AT BEDTIME occasionally FOR insomnia 30 tablet 0   No current facility-administered medications on file prior to visit.    ALLERGIES: Allergies  Allergen Reactions  . Sulfa Antibiotics     childhood    FAMILY HISTORY: Family History  Problem Relation Age of Onset  . Cancer Mother   . Cancer Father   . Heart disease Father   . Stroke Father   . Cancer Maternal Grandmother   . Stroke Maternal Grandfather   . Coronary  artery  disease Other   . Cancer Other   . Stroke Other   . Arrhythmia Other       Objective:  Blood pressure 121/82, pulse (!) 106, height 5\' 9"  (1.753 m), weight 170 lb (77.1 kg), SpO2 97 %. General: No acute distress.  Patient appears well-groomed.   Head:  Normocephalic/atraumatic Eyes:  Fundi examined but not visualized Neck: supple, no paraspinal tenderness, full range of motion Heart:  Regular rate and rhythm Lungs:  Clear to auscultation bilaterally Back: No paraspinal tenderness Neurological Exam: alert and oriented to person, place, and time. Attention span and concentration intact, recent and remote memory intact, fund of knowledge intact.  Speech fluent and not dysarthric, language intact.  CN II-XII intact. Bulk and tone normal, muscle strength 5/5 throughout.  Sensation to light touch intact.  Deep tendon reflexes 2+ throughout, toes downgoing.  Finger to nose and heel to shin testing intact.  Gait normal, Romberg negative.     , DO  CC: Shon Millet, MD

## 2020-12-13 ENCOUNTER — Encounter: Payer: Self-pay | Admitting: Neurology

## 2020-12-13 ENCOUNTER — Other Ambulatory Visit: Payer: Self-pay

## 2020-12-13 ENCOUNTER — Ambulatory Visit (INDEPENDENT_AMBULATORY_CARE_PROVIDER_SITE_OTHER): Payer: Medicare Other | Admitting: Neurology

## 2020-12-13 VITALS — BP 121/82 | HR 106 | Ht 69.0 in | Wt 170.0 lb

## 2020-12-13 DIAGNOSIS — G44219 Episodic tension-type headache, not intractable: Secondary | ICD-10-CM

## 2020-12-13 NOTE — Patient Instructions (Signed)
Continue amitriptyline 50mg  at bedtime Use Excedrin as needed.  Limit use of pain relievers to no more than 2 days out of week to prevent risk of rebound or medication-overuse headache. Follow up 9 months.

## 2021-02-21 ENCOUNTER — Encounter: Payer: Self-pay | Admitting: Family Medicine

## 2021-03-21 ENCOUNTER — Ambulatory Visit (INDEPENDENT_AMBULATORY_CARE_PROVIDER_SITE_OTHER): Payer: Medicare Other

## 2021-03-21 VITALS — Ht 68.0 in | Wt 170.0 lb

## 2021-03-21 DIAGNOSIS — Z0000 Encounter for general adult medical examination without abnormal findings: Secondary | ICD-10-CM

## 2021-03-21 NOTE — Patient Instructions (Signed)
Brian [name] , Thank you for taking time to complete your Medicare Wellness Visit. I appreciate your ongoing commitment to your health goals. Please review the following plan we discussed and let me know if I can assist you in the future.   Screening recommendations/referrals: Colonoscopy: Completed 03/09/2015-Due  03/08/2025 Recommended yearly ophthalmology/optometry visit for glaucoma screening and checkup Recommended yearly dental visit for hygiene and checkup  Vaccinations: Influenza vaccine: Declined Pneumococcal vaccine: Up to date Tdap vaccine: Up to date-Due-12/06/2030 Shingles vaccine: Completed vaccines   Covid-19: Up to date  Advanced directives: Please bring a copy for your chart  Conditions/risks identified: See problem list  Next appointment: Follow up in one year for your annual wellness visit.   Preventive Care 6 Years and Older, Male Preventive care refers to lifestyle choices and visits with your health care provider that can promote health and wellness. What does preventive care include? A yearly physical exam. This is also called an annual well check. Dental exams once or twice a year. Routine eye exams. Ask your health care provider how often you should have your eyes checked. Personal lifestyle choices, including: Daily care of your teeth and gums. Regular physical activity. Eating a healthy diet. Avoiding tobacco and drug use. Limiting alcohol use. Practicing safe sex. Taking low doses of aspirin every day. Taking vitamin and mineral supplements as recommended by your health care provider. What happens during an annual well check? The services and screenings done by your health care provider during your annual well check will depend on your age, overall health, lifestyle risk factors, and family history of disease. Counseling  Your health care provider may ask you questions about your: Alcohol use. Tobacco use. Drug use. Emotional well-being. Home and  relationship well-being. Sexual activity. Eating habits. History of falls. Memory and ability to understand (cognition). Work and work Astronomer. Screening  You may have the following tests or measurements: Height, weight, and BMI. Blood pressure. Lipid and cholesterol levels. These may be checked every 5 years, or more frequently if you are over 8 years old. Skin check. Lung cancer screening. You may have this screening every year starting at age 50 if you have a 30-pack-year history of smoking and currently smoke or have quit within the past 15 years. Fecal occult blood test (FOBT) of the stool. You may have this test every year starting at age 57. Flexible sigmoidoscopy or colonoscopy. You may have a sigmoidoscopy every 5 years or a colonoscopy every 10 years starting at age 80. Prostate cancer screening. Recommendations will vary depending on your family history and other risks. Hepatitis C blood test. Hepatitis B blood test. Sexually transmitted disease (STD) testing. Diabetes screening. This is done by checking your blood sugar (glucose) after you have not eaten for a while (fasting). You may have this done every 1-3 years. Abdominal aortic aneurysm (AAA) screening. You may need this if you are a current or former smoker. Osteoporosis. You may be screened starting at age 4 if you are at high risk. Talk with your health care provider about your test results, treatment options, and if necessary, the need for more tests. Vaccines  Your health care provider may recommend certain vaccines, such as: Influenza vaccine. This is recommended every year. Tetanus, diphtheria, and acellular pertussis (Tdap, Td) vaccine. You may need a Td booster every 10 years. Zoster vaccine. You may need this after age 79. Pneumococcal 13-valent conjugate (PCV13) vaccine. One dose is recommended after age 32. Pneumococcal polysaccharide (PPSV23) vaccine. One dose  is recommended after age 15. Talk to your  health care provider about which screenings and vaccines you need and how often you need them. This information is not intended to replace advice given to you by your health care provider. Make sure you discuss any questions you have with your health care provider. Document Released: 09/30/2015 Document Revised: 05/23/2016 Document Reviewed: 07/05/2015 Elsevier Interactive Patient Education  2017 [place] Prevention in the Home Falls can cause injuries. They can happen to people of all ages. There are many things you can do to make your home safe and to help prevent falls. What can I do on the outside of my home? Regularly fix the edges of walkways and driveways and fix any cracks. Remove anything that might make you trip as you walk through a door, such as a raised step or threshold. Trim any bushes or trees on the path to your home. Use bright outdoor lighting. Clear any walking paths of anything that might make someone trip, such as rocks or tools. Regularly check to see if handrails are loose or broken. Make sure that both sides of any steps have handrails. Any raised decks and porches should have guardrails on the edges. Have any leaves, snow, or ice cleared regularly. Use sand or salt on walking paths during winter. Clean up any spills in your garage right away. This includes oil or grease spills. What can I do in the bathroom? Use night lights. Install grab bars by the toilet and in the tub and shower. Do not use towel bars as grab bars. Use non-skid mats or decals in the tub or shower. If you need to sit down in the shower, use a plastic, non-slip stool. Keep the floor dry. Clean up any water that spills on the floor as soon as it happens. Remove soap buildup in the tub or shower regularly. Attach bath mats securely with double-sided non-slip rug tape. Do not have throw rugs and other things on the floor that can make you trip. What can I do in the bedroom? Use night  lights. Make sure that you have a light by your bed that is easy to reach. Do not use any sheets or blankets that are too big for your bed. They should not hang down onto the floor. Have a firm chair that has side arms. You can use this for support while you get dressed. Do not have throw rugs and other things on the floor that can make you trip. What can I do in the kitchen? Clean up any spills right away. Avoid walking on wet floors. Keep items that you use a lot in easy-to-reach places. If you need to reach something above you, use a strong step stool that has a grab bar. Keep electrical cords out of the way. Do not use floor polish or wax that makes floors slippery. If you must use wax, use non-skid floor wax. Do not have throw rugs and other things on the floor that can make you trip. What can I do with my stairs? Do not leave any items on the stairs. Make sure that there are handrails on both sides of the stairs and use them. Fix handrails that are broken or loose. Make sure that handrails are as long as the stairways. Check any carpeting to make sure that it is firmly attached to the stairs. Fix any carpet that is loose or worn. Avoid having throw rugs at the top or bottom of the stairs.  If you do have throw rugs, attach them to the floor with carpet tape. Make sure that you have a light switch at the top of the stairs and the bottom of the stairs. If you do not have them, ask someone to add them for you. What else can I do to help prevent falls? Wear shoes that: Do not have high heels. Have rubber bottoms. Are comfortable and fit you well. Are closed at the toe. Do not wear sandals. If you use a stepladder: Make sure that it is fully opened. Do not climb a closed stepladder. Make sure that both sides of the stepladder are locked into place. Ask someone to hold it for you, if possible. Clearly mark and make sure that you can see: Any grab bars or handrails. First and last  steps. Where the edge of each step is. Use tools that help you move around (mobility aids) if they are needed. These include: Canes. Walkers. Scooters. Crutches. Turn on the lights when you go into a dark area. Replace any light bulbs as soon as they burn out. Set up your furniture so you have a clear path. Avoid moving your furniture around. If any of your floors are uneven, fix them. If there are any pets around you, be aware of where they are. Review your medicines with your doctor. Some medicines can make you feel dizzy. This can increase your chance of falling. Ask your doctor what other things that you can do to help prevent falls. This information is not intended to replace advice given to you by your health care provider. Make sure you discuss any questions you have with your health care provider. Document Released: 06/30/2009 Document Revised: 02/09/2016 Document Reviewed: 10/08/2014 Elsevier Interactive Patient Education  2017 [employer].

## 2021-03-21 NOTE — Progress Notes (Addendum)
Subjective:   Brian [name] is a 70 y.o. male who presents for Medicare Annual/Subsequent preventive examination.  I connected with Brian [name] today by telephone and verified that I am speaking with the correct person using two identifiers. Location patient: home Location provider: work Persons participating in the virtual visit: patient, Engineer, civil (consulting).    I discussed the limitations, risks, security and privacy concerns of performing an evaluation and management service by telephone and the availability of in person appointments. I also discussed with the patient that there may be a patient responsible charge related to this service. The patient expressed understanding and verbally consented to this telephonic visit.    Interactive audio and video telecommunications were attempted between this provider and patient, however failed, due to patient having technical difficulties OR patient did not have access to video capability.  We continued and completed visit with audio only.  Some vital signs may be absent or patient reported.   Time Spent with patient on telephone encounter: 20 minutes   Review of Systems     Cardiac Risk Factors include: advanced age (>80men, >76 women);male gender;dyslipidemia     Objective:    Today's Vitals   03/21/21 1500  Weight: 170 lb (77.1 kg)  Height: 5\' 8"  (1.727 m)   Body mass index is 25.85 kg/m.  Advanced Directives 03/21/2021 12/13/2020 06/06/2020 01/22/2020 11/26/2019 11/26/2019 10/10/2015  Does Patient Have a Medical Advance Directive? Yes Yes Yes Yes Yes No;Yes Yes  Type of 10/12/2015 of [place];Living will Healthcare Power of [place];Living will Healthcare Power of [place];Living will - - Healthcare Power of [place];Living will Healthcare Power of [place];Living will  Does patient want to make changes to medical advance directive? - - - - - No - Patient declined No - Patient declined  Copy of Healthcare Power of Attorney in  Chart? No - copy requested - - - - No - copy requested No - copy requested  Would patient like information on creating a medical advance directive? - - - - - No - Patient declined -    Current Medications (verified) Outpatient Encounter Medications as of 03/21/2021  Medication Sig   amitriptyline (ELAVIL) 25 MG tablet TAKE 2 TABLETS BY MOUTH AT BEDTIME AS NEEDED FOR SLEEP   aspirin 81 MG tablet Take 81 mg by mouth daily.   atorvastatin (LIPITOR) 10 MG tablet TAKE 1 TABLET BY MOUTH EVERY DAY   Multiple Vitamin (MULTIVITAMIN) tablet Take 1 tablet by mouth daily.   PARoxetine (PAXIL) 30 MG tablet Take 1 tablet (30 mg total) by mouth daily.   zolpidem (AMBIEN) 10 MG tablet TAKE 1/2 TABLET BY MOUTH AT BEDTIME occasionally FOR insomnia   No facility-administered encounter medications on file as of 03/21/2021.    Allergies (verified) Sulfa antibiotics   History: Past Medical History:  Diagnosis Date   Depression    Duodenitis 10/10/2015   PAF (paroxysmal atrial fibrillation) (HCC) Jan 2017   lone AF in setting of sepsis   Sepsis secondary to UTI (HCC) 10/10/2015   Past Surgical History:  Procedure Laterality Date   APPENDECTOMY     CARDIOVASCULAR STRESS TEST  01/22/2014   GXT - Ex 10 min (10.9 METs), HR 164 = 103% MPHR. No ST-T wave chagnes or Sx.  LOW RISK  --[facility] TM Score 10    HOLTER MONITOR  10/2015   Mostly normal sinus rhythm. Recommended avoiding stimulants, energy drinks and decongestants. No signs of A. fib.11/2015 PVCs noted in pairs, singles, bigeminy and trigeminy. No  A. fib.   TRANSTHORACIC ECHOCARDIOGRAM  02/2014   Normal LV size and function.  EF 68%.  Mild MR.  Mild TR.  No pulmonary hypertension.  Blood normal.   VASECTOMY     Family History  Problem Relation Age of Onset   Cancer Mother    Cancer Father    Heart disease Father    Stroke Father    Cancer Maternal Grandmother    Stroke Maternal Grandfather    Coronary artery disease Other    Cancer Other    Stroke Other     Arrhythmia Other    Social History   Socioeconomic History   Marital status: Married    Spouse name: Brian [name]   Number of children: 2   Years of education: College   Highest education level: Not on file  Occupational History   Occupation: retired Midwifebus driver  Tobacco Use   Smoking status: Never   Smokeless tobacco: Never  Vaping Use   Vaping Use: Never used  Substance and Sexual Activity   Alcohol use: No    Alcohol/week: 0.0 standard drinks   Drug use: No   Sexual activity: Yes    Partners: Female  Other Topics Concern   Not on file  Social History Narrative   He is married to [name] - who is a Teacher, early years/prepharmacist.      He is a retired Emergency planning/management officerpolice officer, he has 2 school-aged grandchildren who are now 7 and 9.        He enjoys helping to take care of them, building furniture and volunteering with church and [employer].  His grandkids are doing virtual school- he and his wife are helping to do the home schooling   He is not doing as much volunteer work during the pandemic   He helps to review the [employer] scout projects- this is still up and running       Exercise- silver sneakers   Left handed   Two story home   Social Determinants of Health   Financial Resource Strain: Low Risk    Difficulty of Paying Living Expenses: Not hard at all  Food Insecurity: No Food Insecurity   Worried About Programme researcher, broadcasting/film/videounning Out of Food in the Last Year: Never true   Baristaan Out of Food in the Last Year: Never true  Transportation Needs: No Transportation Needs   Lack of Transportation (Medical): No   Lack of Transportation (Non-Medical): No  Physical Activity: Sufficiently Active   Days of Exercise per Week: 7 days   Minutes of Exercise per Session: 30 min  Stress: No Stress Concern Present   Feeling of Stress : Not at all  Social Connections: Socially Integrated   Frequency of Communication with Friends and Family: More than three times a week   Frequency of Social Gatherings with Friends and Family:  More than three times a week   Attends Religious Services: More than 4 times per year   Active Member of [employer]ubs or Organizations: Yes   Attends Engineer, structuralClub or Organization Meetings: More than 4 times per year   Marital Status: Married    Tobacco Counseling Counseling given: Not Answered   Clinical Intake:  Pre-visit preparation completed: Yes  Pain : No/denies pain     Nutritional Status: BMI 25 -29 Overweight Nutritional Risks: None Diabetes: No  How often do you need to have someone help you when you read instructions, pamphlets, or other written materials from your doctor or pharmacy?: 1 - Never  Diabetic?No  Interpreter Needed?: No  Information entered by :: Thomasenia Sales LPN   Activities of Daily Living In your present state of health, do you have any difficulty performing the following activities: 03/21/2021  Hearing? Y  Comment hearing aids  Vision? N  Difficulty concentrating or making decisions? N  Walking or climbing stairs? N  Dressing or bathing? N  Doing errands, shopping? N  Preparing Food and eating ? N  Using the Toilet? N  In the past six months, have you accidently leaked urine? N  Do you have problems with loss of bowel control? N  Managing your Medications? N  Managing your Finances? N  Housekeeping or managing your Housekeeping? N  Some recent data might be hidden    Patient Care Team: Copland, Gwenlyn Found, MD as PCP - General (Family Medicine) Marykay Lex, MD as PCP - Cardiology (Cardiology) Bernette Redbird, MD (Gastroenterology) Drema Dallas, DO as Consulting Physician (Neurology)  Indicate any recent Medical Services you may have received from other than [facility] providers in the past year (date may be approximate).     Assessment:   This is a routine wellness examination for Chrissie Noa.  Hearing/Vision screen Hearing Screening - Comments:: Bilateral hearing aids Vision Screening - Comments:: Reading glasses Last eye exam-2022-[facility]  Dietary issues and exercise activities discussed: Current Exercise Habits: Home exercise routine, Type of exercise: walking, Time (Minutes): 30, Frequency (Times/Week): 7, Weekly Exercise (Minutes/Week): 210, Intensity: Mild, Exercise limited by: None identified   Goals Addressed             This Visit's Progress    Maintain healthy active lifestyle.   On track      Depression Screen PHQ 2/9 Scores 03/21/2021 11/26/2019 11/18/2017 11/11/2015 12/24/2014 12/22/2013  PHQ - 2 Score 0 0 0 0 0 0    Fall Risk Fall Risk  03/21/2021 12/13/2020 06/06/2020 01/22/2020 11/26/2019  Falls in the past year? 0 0 0 0 0  Number falls in past yr: 0 0 0 0 0  Injury with Fall? 0 0 0 0 0  Follow up Falls prevention discussed - - - Education provided;Falls prevention discussed    FALL RISK PREVENTION PERTAINING TO THE HOME:  Any stairs in or around the home? Yes  If so, are there any without handrails? No  Home free of loose throw rugs in walkways, pet beds, electrical cords, etc? Yes  Adequate lighting in your home to reduce risk of falls? Yes   ASSISTIVE DEVICES UTILIZED TO PREVENT FALLS:  Life alert? No  Use of a cane, walker or w/c? No  Grab bars in the bathroom? Yes  Shower chair or bench in shower? No  Elevated toilet seat or a handicapped toilet? No   TIMED UP AND GO:  Was the test performed? No . Phone visit   Cognitive Function:Normal cognitive status assessed by this Nurse Health Advisor. No abnormalities found.          Immunizations Immunization History  Administered Date(s) Administered   Influenza Split 06/08/2010, 07/18/2012   PFIZER(Purple Top)SARS-COV-2 Vaccination 10/09/2019, 10/30/2019, 06/21/2020, 02/21/2021   Pneumococcal Conjugate-13 11/18/2017   Pneumococcal Polysaccharide-23 11/24/2018   Td 12/05/2020   Tdap 06/08/2010   Zoster Recombinat (Shingrix) 04/24/2019, 07/31/2019   Zoster, Live 02/26/2013    TDAP status: Up to date  Flu Vaccine status: Declined,  Education has been provided regarding the importance of this vaccine but patient still declined. Advised may receive this vaccine at local pharmacy or Health Dept.  Aware to provide a copy of the vaccination record if obtained from local pharmacy or Health Dept. Verbalized acceptance and understanding.  Pneumococcal vaccine status: Up to date  Covid-19 vaccine status: Completed vaccines  Qualifies for Shingles Vaccine? No   Zostavax completed Yes   Shingrix Completed?: Yes  Screening Tests Health Maintenance  Topic Date Due   INFLUENZA VACCINE  07/21/2024 (Originally 04/17/2021)   COLONOSCOPY (Pts 45-8yrs Insurance coverage will need to be confirmed)  01/15/2025   TETANUS/TDAP  12/06/2030   COVID-19 Vaccine  Completed   Hepatitis C Screening  Completed   PNA vac Low Risk Adult  Completed   Zoster Vaccines- Shingrix  Completed   HPV VACCINES  Aged Out    Health Maintenance  There are no preventive care reminders to display for this patient.  Colorectal cancer screening: Type of screening: Colonoscopy. Completed 01/16/2015. Repeat every 10 years  Lung Cancer Screening: (Low Dose CT Chest recommended if Age 50-80 years, 30 pack-year currently smoking OR have quit w/in 15years.) does not qualify.     Additional Screening:  Hepatitis C Screening:  Completed 01/05/2016  Vision Screening: Recommended annual ophthalmology exams for early detection of glaucoma and other disorders of the eye. Is the patient up to date with their annual eye exam?  Yes  Who is the provider or what is the name of the office in which the patient attends annual eye exams? Dr. Lorin Picket   Dental Screening: Recommended annual dental exams for proper oral hygiene  Community Resource Referral / Chronic Care Management: CRR required this visit?  No   CCM required this visit?  No      Plan:     I have personally reviewed and noted the following in the patient's chart:   Medical and social history Use of  alcohol, tobacco or illicit drugs  Current medications and supplements including opioid prescriptions. Patient is not currently taking opioid prescriptions. Functional ability and status Nutritional status Physical activity Advanced directives List of other physicians Hospitalizations, surgeries, and ER visits in previous 12 months Vitals Screenings to include cognitive, depression, and falls Referrals and appointments  In addition, I have reviewed and discussed with patient certain preventive protocols, quality metrics, and best practice recommendations. A written personalized care plan for preventive services as well as general preventive health recommendations were provided to patient.   Due to this being a telephonic visit, the after visit summary with patients personalized plan was offered to patient via mail or my-chart. Patient would like to access on my-chart.    Roanna Raider, LPN   12/25/4494  Nurse Health Advisor  Nurse Notes: None  Medical screening examination/treatment/procedure(s) were performed by non-physician practitioner and as supervising provider I was immediately available for consultation/collaboration.  I agree with above. Olive Bass, FNP

## 2021-06-21 ENCOUNTER — Encounter: Payer: Self-pay | Admitting: Family Medicine

## 2021-06-26 ENCOUNTER — Encounter: Payer: Self-pay | Admitting: Family Medicine

## 2021-06-26 ENCOUNTER — Other Ambulatory Visit: Payer: Self-pay | Admitting: Family Medicine

## 2021-06-26 DIAGNOSIS — G4700 Insomnia, unspecified: Secondary | ICD-10-CM

## 2021-06-27 MED ORDER — ZOLPIDEM TARTRATE 10 MG PO TABS: ORAL_TABLET | ORAL | 0 refills | Status: DC

## 2021-07-01 NOTE — Patient Instructions (Addendum)
Good to see you again today- I will be in touch with your labs asap Assuming all is well we can plan to visit in 6- 12 months

## 2021-07-01 NOTE — Progress Notes (Addendum)
[facility] [address], [address] [place], [place] [address] [phone]  Date:  07/03/2021   Name:  Brian [name]   DOB:  04/09/1951   MRN:  [id number]  PCP:  [name], MD    Chief Complaint: 6 month follow up (Concerns/ questions: none/Flu shot today: had Sep 2022)   History of Present Illness:  Brian [name] is a 70 y.o. very pleasant male patient who presents with the following:  Pt seen today for follow-up- history of A fib, dyslipidemia and depression, prediabetes  Last visit with myself in March  Flu and bivalent UTD Shingrix done   He is getting synvisc in his left knee and cortisone for his shoulders per Dorene Grebe  He notes that he is sleeping well- he generally uses amitriptyline for sleep support  He uses ambien once every couple of weeks - he may use it if he does not get to take his amitriptyline or if he had a bad night of sleep  He is happy with his paxil - controlling depression sx well for him    He likes walking for exercise and doing some weight training No SOB or CP with exercise  Patient Active Problem List   Diagnosis Date Noted   History of heat exhaustion 06/19/2020   Exercise intolerance 06/19/2020   Prediabetes 05/28/2020   BMI 25.0-25.9,adult 11/11/2015   Family history of coronary artery disease in father 10/25/2015   Paroxysmal atrial fibrillation (HCC) 10/11/2015   Dyslipidemia 10/10/2015   Depression     Past Medical History:  Diagnosis Date   Depression    Duodenitis 10/10/2015   PAF (paroxysmal atrial fibrillation) (HCC) Jan 2017   lone AF in setting of sepsis   Sepsis secondary to UTI (HCC) 10/10/2015    Past Surgical History:  Procedure Laterality Date   APPENDECTOMY     CARDIOVASCULAR STRESS TEST  01/22/2014   GXT - Ex 10 min (10.9 METs), HR 164 = 103% MPHR. No ST-T wave chagnes or Sx.  LOW RISK  --[facility] TM Score 10    HOLTER MONITOR  10/2015   Mostly normal  sinus rhythm. Recommended avoiding stimulants, energy drinks and decongestants. No signs of A. fib.Marland Kitchen PVCs noted in pairs, singles, bigeminy and trigeminy. No A. fib.   TRANSTHORACIC ECHOCARDIOGRAM  02/2014   Normal LV size and function.  EF 68%.  Mild MR.  Mild TR.  No pulmonary hypertension.  Blood normal.   VASECTOMY      Social History   Tobacco Use   Smoking status: Never   Smokeless tobacco: Never  Vaping Use   Vaping Use: Never used  Substance Use Topics   Alcohol use: No    Alcohol/week: 0.0 standard drinks   Drug use: No    Family History  Problem Relation Age of Onset   Cancer Mother    Cancer Father    Heart disease Father    Stroke Father    Cancer Maternal Grandmother    Stroke Maternal Grandfather    Coronary artery disease Other    Cancer Other    Stroke Other    Arrhythmia Other     Allergies  Allergen Reactions   Sulfa Antibiotics     childhood    Medication list has been reviewed and updated.  Current Outpatient Medications on File Prior to Visit  Medication Sig Dispense Refill   amitriptyline (ELAVIL) 25 MG tablet TAKE 2 TABLETS BY  MOUTH AT BEDTIME AS NEEDED FOR SLEEP 180 tablet 3   aspirin 81 MG tablet Take 81 mg by mouth daily.     atorvastatin (LIPITOR) 10 MG tablet TAKE 1 TABLET BY MOUTH EVERY DAY 90 tablet 3   Multiple Vitamin (MULTIVITAMIN) tablet Take 1 tablet by mouth daily.     PARoxetine (PAXIL) 30 MG tablet Take 1 tablet (30 mg total) by mouth daily. 90 tablet 3   zolpidem (AMBIEN) 10 MG tablet TAKE 1/2 TABLET BY MOUTH AT BEDTIME occasionally FOR insomnia 30 tablet 0   No current facility-administered medications on file prior to visit.    Review of Systems:  As per HPI- otherwise negative.   Physical Examination: Vitals:   07/03/21 0909  BP: 124/75  Pulse: 90  Resp: 18  Temp: 98 F (36.7 C)  SpO2: 96%   Vitals:   07/03/21 0909  Weight: 166 lb 9.6 oz (75.6 kg)  Height: 5\' 8"  (1.727 m)   Body mass index is 25.33  kg/m. Ideal Body Weight: Weight in (lb) to have BMI = 25: 164.1  GEN: no acute distress.  Normal weight, looks well  HEENT: Atraumatic, Normocephalic.  Ears and Nose: No external deformity. CV: RRR, No M/G/R. No JVD. No thrill. No extra heart sounds. PULM: CTA B, no wheezes, crackles, rhonchi. No retractions. No resp. distress. No accessory muscle use. ABD: S, NT, ND. No rebound. No HSM. EXTR: No c/c/e PSYCH: Normally interactive. Conversant.    Assessment and Plan: Prediabetes - Plan: Comprehensive metabolic panel, Hemoglobin A1c  Dyslipidemia  Depression, unspecified depression type  Insomnia, unspecified type - Plan: zolpidem (AMBIEN) 10 MG tablet  Elevated cholesterol - Plan: atorvastatin (LIPITOR) 10 MG tablet  Increased prostate specific antigen (PSA) velocity - Plan: PSA  Following up today, labs are pending as above Will plan further follow- up pending labs. Immunizations are up-to-date  Signed , MD  Received his labs as below, message to patient  Results for orders placed or performed in visit on 07/03/21  Comprehensive metabolic panel  Result Value Ref Range   Sodium 139 135 - 145 mEq/L   Potassium 4.4 3.5 - 5.1 mEq/L   Chloride 103 96 - 112 mEq/L   CO2 28 19 - 32 mEq/L   Glucose, Bld 89 70 - 99 mg/dL   BUN 18 6 - 23 mg/dL   Creatinine, Ser 07/05/21 0.40 - 1.50 mg/dL   Total Bilirubin 0.5 0.2 - 1.2 mg/dL   Alkaline Phosphatase 109 39 - 117 U/L   AST 14 0 - 37 U/L   ALT 14 0 - 53 U/L   Total Protein 6.5 6.0 - 8.3 g/dL   Albumin 4.1 3.5 - 5.2 g/dL   GFR 3.22 02.54 mL/min   Calcium 9.0 8.4 - 10.5 mg/dL  Hemoglobin >27.06  Result Value Ref Range   Hgb A1c MFr Bld 6.2 4.6 - 6.5 %  PSA  Result Value Ref Range   PSA 1.95 0.10 - 4.00 ng/mL

## 2021-07-03 ENCOUNTER — Other Ambulatory Visit: Payer: Self-pay

## 2021-07-03 ENCOUNTER — Ambulatory Visit (INDEPENDENT_AMBULATORY_CARE_PROVIDER_SITE_OTHER): Payer: Medicare Other | Admitting: Family Medicine

## 2021-07-03 ENCOUNTER — Encounter: Payer: Self-pay | Admitting: Family Medicine

## 2021-07-03 VITALS — BP 124/75 | HR 90 | Temp 98.0°F | Resp 18 | Ht 68.0 in | Wt 166.6 lb

## 2021-07-03 DIAGNOSIS — G4700 Insomnia, unspecified: Secondary | ICD-10-CM

## 2021-07-03 DIAGNOSIS — E7800 Pure hypercholesterolemia, unspecified: Secondary | ICD-10-CM

## 2021-07-03 DIAGNOSIS — E785 Hyperlipidemia, unspecified: Secondary | ICD-10-CM | POA: Diagnosis not present

## 2021-07-03 DIAGNOSIS — F32A Depression, unspecified: Secondary | ICD-10-CM

## 2021-07-03 DIAGNOSIS — R972 Elevated prostate specific antigen [PSA]: Secondary | ICD-10-CM

## 2021-07-03 DIAGNOSIS — R7303 Prediabetes: Secondary | ICD-10-CM | POA: Diagnosis not present

## 2021-07-03 LAB — COMPREHENSIVE METABOLIC PANEL
ALT: 14 U/L (ref 0–53)
AST: 14 U/L (ref 0–37)
Albumin: 4.1 g/dL (ref 3.5–5.2)
Alkaline Phosphatase: 109 U/L (ref 39–117)
BUN: 18 mg/dL (ref 6–23)
CO2: 28 mEq/L (ref 19–32)
Calcium: 9 mg/dL (ref 8.4–10.5)
Chloride: 103 mEq/L (ref 96–112)
Creatinine, Ser: 0.98 mg/dL (ref 0.40–1.50)
GFR: 79.17 mL/min (ref >60.00)
Glucose, Bld: 89 mg/dL (ref 70–99)
Potassium: 4.4 mEq/L (ref 3.5–5.1)
Sodium: 139 mEq/L (ref 135–145)
Total Bilirubin: 0.5 mg/dL (ref 0.2–1.2)
Total Protein: 6.5 g/dL (ref 6.0–8.3)

## 2021-07-03 LAB — PSA: PSA: 1.95 ng/mL (ref 0.10–4.00)

## 2021-07-03 LAB — HEMOGLOBIN A1C: Hgb A1c MFr Bld: 6.2 % (ref 4.6–6.5)

## 2021-07-03 MED ORDER — ZOLPIDEM TARTRATE 10 MG PO TABS: ORAL_TABLET | ORAL | 0 refills | Status: DC

## 2021-07-03 MED ORDER — ATORVASTATIN CALCIUM 10 MG PO TABS: 10.0000 mg | ORAL_TABLET | Freq: Every day | ORAL | 3 refills | Status: DC

## 2021-07-04 ENCOUNTER — Encounter: Payer: Self-pay | Admitting: Family Medicine

## 2021-08-18 ENCOUNTER — Ambulatory Visit: Payer: Medicare Other | Admitting: Cardiology

## 2021-09-20 ENCOUNTER — Other Ambulatory Visit: Payer: Self-pay | Admitting: Family Medicine

## 2021-09-20 DIAGNOSIS — G4700 Insomnia, unspecified: Secondary | ICD-10-CM

## 2021-09-24 NOTE — Progress Notes (Signed)
NEUROLOGY FOLLOW UP OFFICE NOTE  Brian [name] GS:[id number]  Assessment/Plan:   1  Episodic tension-type headache, not intractable 2  Possible left upper lumbar radiculopathy   1.  Refer to physical therapy 2.  Headache prevention:  Amitriptyline 50mg  at bedtime 3.  Headache rescue:  Excedrin 4.  Limit use of pain relievers to no more than 2 days out of week to prevent risk of rebound or medication-overuse headache. 5.  Keep headache diary 6.  Follow up 9 months   Subjective:  Brian [name] is a 71 year old left-handed white male with PAF who follows up for headache.   UPDATE: 6 months ago Right thigh ,deep 1 second to go from 0-7/10.  Needs to grab and hold it for 6-8 seconds. Not positional, no time of day, 6 in a day and week without one, - arthritis inkneees altered walk - sharp stabbing pain no numbness, tinlging,j incontin, weak  Headaches are moderate, last 30 minutes with 2 Excedrin and occurs about once a week  He has had intermittent left thigh pain for the past 6 months.  If feels like a deep sharp stabbing pain down the anterior thigh.  The pain causes him to instinctively grab his thigh and it abates in 6 to 8 seconds.  No associated back pain, numbness, weakness, or bowel and bladder dysfunction.  It is not positional.  No preceding injury.  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  Current NSAIDS:  ASA 81mg  daily; naproxen Current analgesics:  Excedrin Migraine Current triptans:  none Current ergotamine:  none Current anti-emetic:  none Current muscle relaxants:  none Current anti-anxiolytic:  none Current sleep aide:  Ambien 10mg  (rarely); amitriptyline 50mg  QHS Current Antihypertensive medications:  none Current Antidepressant medications:  Amitriptyline 50mg  at bedtime; paroxetine 30mg  daily Current Anticonvulsant medications:  none Current anti-CGRP:  none Current  Vitamins/Herbal/Supplements:  MVI Current Antihistamines/Decongestants:  none Other therapy:  none Hormone/birth control:  none   No caffeine   HISTORY:  He has had headaches since his late 55s.  They are usually 3 to 5/10 bitemporal non-throbbing headaches, worse laying down and better when upright.  No nausea, photophobia, phonophobia, osmophobia, vomiting, visual disturbance, numbness or weakness.  Hunger may be a trigger.  They usually last 1 to 3 hours.  He had 9 headaches in last 30 days   History of childhood allergies, which he grew out of. In 2019, a stool collapsed under him and fell on back of his head on concrete floor.  No loss of consciousness or concussion symptoms.   Eye exam has been okay 1/2 cup of coffee daily (may be decaf) Drinks about 2 quarts of water daily No routine exercise. Left knee and hand arthritis.  Takes Aleve daily.   Sleep is good.  He takes Ambien or amitriptyline Once in a while he notes a very mild tremor when using either hand. His father had daily headaches for the last 20 years of his life.  His daughter has headaches.   Past NSAIDS:  ibuprofen Past analgesics:  Fioricet w/codeine Past abortive triptans:  none Past abortive ergotamine:  none Past muscle relaxants:  none Past anti-emetic:  none Past antihypertensive medications:  none Past antidepressant medications:  none Past anticonvulsant medications:  none Past anti-CGRP:  none Past vitamins/Herbal/Supplements:  none Past antihistamines/decongestants:  none Other past therapies:  none  PAST MEDICAL HISTORY: Past Medical  History:  Diagnosis Date   Depression    Duodenitis 10/10/2015   PAF (paroxysmal atrial fibrillation) ([facility]) Jan 2017   lone AF in setting of sepsis   Sepsis secondary to UTI ([facility]) 10/10/2015    MEDICATIONS: Current Outpatient Medications on File Prior to Visit  Medication Sig Dispense Refill   amitriptyline (ELAVIL) 25 MG tablet TAKE 2 TABLETS BY MOUTH NIGHTLY  AT BEDTIME AS NEEDED FOR SLEEP 180 tablet 1   aspirin 81 MG tablet Take 81 mg by mouth daily.     atorvastatin (LIPITOR) 10 MG tablet Take 1 tablet (10 mg total) by mouth daily. 90 tablet 3   Multiple Vitamin (MULTIVITAMIN) tablet Take 1 tablet by mouth daily.     PARoxetine (PAXIL) 30 MG tablet Take 1 tablet (30 mg total) by mouth daily. 90 tablet 3   zolpidem (AMBIEN) 10 MG tablet TAKE 1/2 TABLET BY MOUTH AT BEDTIME occasionally FOR insomnia 30 tablet 0   No current facility-administered medications on file prior to visit.    ALLERGIES: Allergies  Allergen Reactions   Sulfa Antibiotics     childhood    FAMILY HISTORY: Family History  Problem Relation Age of Onset   Cancer Mother    Cancer Father    Heart disease Father    Stroke Father    Cancer Maternal Grandmother    Stroke Maternal Grandfather    Coronary artery disease Other    Cancer Other    Stroke Other    Arrhythmia Other       Objective:  Blood pressure 127/80, pulse (!) 103, resp. rate 18, height 5\' 8"  (1.727 m), weight 171 lb (77.6 kg), SpO2 96 %. General: No acute distress.  Patient appears well-groomed.   Head:  Normocephalic/atraumatic Eyes:  Fundi examined but not visualized Neck: supple, no paraspinal tenderness, full range of motion Heart:  Regular rate and rhythm Lungs:  Clear to auscultation bilaterally Back: No paraspinal tenderness Neurological Exam: alert and oriented to person, place, and time.  Speech fluent and not dysarthric, language intact.  CN II-XII intact. Bulk and tone normal, muscle strength 5/5 throughout.  Sensation to light touch intact.  Deep tendon reflexes 2+ throughout, toes downgoing.  Finger to nose testing intact.  Gait normal, Romberg negative.  Straight leg raise and FABER test negative.   Metta Clines, DO  CC: Lamar Blinks, MD

## 2021-09-25 ENCOUNTER — Encounter: Payer: Self-pay | Admitting: Neurology

## 2021-09-25 ENCOUNTER — Encounter: Payer: Self-pay | Admitting: Family Medicine

## 2021-09-25 ENCOUNTER — Ambulatory Visit (INDEPENDENT_AMBULATORY_CARE_PROVIDER_SITE_OTHER): Payer: Medicare Other | Admitting: Neurology

## 2021-09-25 ENCOUNTER — Other Ambulatory Visit: Payer: Self-pay

## 2021-09-25 VITALS — BP 127/80 | HR 103 | Resp 18 | Ht 68.0 in | Wt 171.0 lb

## 2021-09-25 DIAGNOSIS — M5416 Radiculopathy, lumbar region: Secondary | ICD-10-CM

## 2021-09-25 DIAGNOSIS — G44219 Episodic tension-type headache, not intractable: Secondary | ICD-10-CM | POA: Diagnosis not present

## 2021-09-25 NOTE — Patient Instructions (Signed)
Continue amitriptyline 50mg  at bedtime For leg pain, refer to physical therapy Follow up in one year or sooner if needed.

## 2021-10-10 NOTE — Therapy (Addendum)
OUTPATIENT PHYSICAL THERAPY THORACOLUMBAR EVALUATION   Patient Name: Brian [name] MRN: [id number] DOB:11-13-1950, 71 y.o., male Today's Date: 10/13/2021   PT End of Session - 10/13/21 1344     Visit Number 1    PT Start Time 1300    PT Stop Time 1330    PT Time Calculation (min) 30 min             Past Medical History:  Diagnosis Date   Depression    Duodenitis 10/10/2015   PAF (paroxysmal atrial fibrillation) (HCC) Jan 2017   lone AF in setting of sepsis   Sepsis secondary to UTI (HCC) 10/10/2015   Past Surgical History:  Procedure Laterality Date   APPENDECTOMY     CARDIOVASCULAR STRESS TEST  01/22/2014   GXT - Ex 10 min (10.9 METs), HR 164 = 103% MPHR. No ST-T wave chagnes or Sx.  LOW RISK  --[facility] TM Score 10    HOLTER MONITOR  10/2015   Mostly normal sinus rhythm. Recommended avoiding stimulants, energy drinks and decongestants. No signs of A. fib.Marland Kitchen PVCs noted in pairs, singles, bigeminy and trigeminy. No A. fib.   TRANSTHORACIC ECHOCARDIOGRAM  02/2014   Normal LV size and function.  EF 68%.  Mild MR.  Mild TR.  No pulmonary hypertension.  Blood normal.   VASECTOMY     Patient Active Problem List   Diagnosis Date Noted   History of heat exhaustion 06/19/2020   Exercise intolerance 06/19/2020   Prediabetes 05/28/2020   BMI 25.0-25.9,adult 11/11/2015   Family history of coronary artery disease in father 10/25/2015   Paroxysmal atrial fibrillation (HCC) 10/11/2015   Dyslipidemia 10/10/2015   Depression     PCP: Brian [name], Brian Found, MD  REFERRING PROVIDER: Drema Dallas, DO  REFERRING DIAG: Referral diagnosis: Lumbar radiculopathy [M54.16]  THERAPY DIAG:  Pain in left thigh  ONSET DATE: 04/12/2022  SUBJECTIVE:                                                                                                                                                                                           Brian [name] is a 71 y.o. male who presents to clinic with chief  complaint of intermittent L lateral thigh pain.  MOI/History of condition:   Sudden onset of pain sharp, deep, no clear pattern.  anterio-medial L thigh pain about 4 inches above medial joint line of knee.  States that MD ruled out vascular cause.  Denies trauma, started about 6 months ago.  Lasts for about 5 seconds.  Occurs 1-12 times a week.  He cannot reproduce on demand.  History of bil knee arthritis  L>R  From referring provider:   "Brian BellowWilliam [name] is a 71 year old left-handed white male with PAF who follows up for headache.   UPDATE: 6 months ago Right thigh ,deep 1 second to go from 0-7/10.  Needs to grab and hold it for 6-8 seconds. Not positional, no time of day, 6 in a day and week without one, - arthritis inkneees altered walk - sharp stabbing pain no numbness, tinlging,j incontin, weak   Headaches are moderate, last 30 minutes with 2 Excedrin and occurs about once a week   He has had intermittent left thigh pain for the past 6 months.  If feels like a deep sharp stabbing pain down the anterior thigh.  The pain causes him to instinctively grab his thigh and it abates in 6 to 8 seconds.  No associated back pain, numbness, weakness, or bowel and bladder dysfunction.  It is not positional.  No preceding injury.  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 No Pain location: see subjective NPRS scale:  highest 7/10 current 0/10  best 0/10 Aggravating factors: unclear Relieving factors: unclear Pain description: intermittent, sharp, and stabbing Severity: high Irritability: low Stage: Chronic Stability: staying the same 24 hour pattern: no clear pattern   Occupation: retired  Hobbies/Recreation: not prevented from  recreation  Assistive Device: none  Hand Dominance: none  Patient Goals: rule out lumbar as pain generator   PRECAUTIONS: None  WEIGHT BEARING RESTRICTIONS No  FALLS:  Has patient fallen in last 6 months? No, Number of falls: 0  LIVING ENVIRONMENT: Lives with: lives with their family   PLOF: Independent  DIAGNOSTIC FINDINGS:  none   OBJECTIVE:   GENERAL OBSERVATION:  L lateral shift, L hip drop in gait, R IC higher than L; R leg 36'', L leg 35'' measured in long sitting  SENSATION:  Light touch: Appears intact  MUSCLE LENGTH: Hamstrings: Right WNL deg; Left WNL deg Slight tightness bil rec fem   LUMBAR AROM  AROM AROM  10/13/2021  Flexion WNL  Extension WNL  Right lateral flexion WNL  Left lateral flexion WNL  Right rotation WNL  Left rotation WNL    (Blank rows = not tested)   LE MMT:  MMT Right 10/13/2021 Left 10/13/2021  Hip flexion (L2, L3)    Knee extension (L3)    Knee flexion    Hip abduction 4 4  Hip extension 4 4  Hip external rotation    Hip internal rotation    Hip adduction    Ankle dorsiflexion (L4)    Ankle plantarflexion (S1)    Ankle inversion    Ankle eversion    Great Toe ext (L5)     (Blank rows = not tested, score listed is out of 5 possible points.  N = WNL, D = diminished, C = clear for gross weakness with myotome testing, * = concordant pain with testing)   LE ROM: WNL    LUMBAR SPECIAL TESTS:  Straight leg raise: L (-), R (-) Femoral nerve tension (-)   PALPATION:   (-) for tenderness over L VL and RF, (-) for tenderness in lumbar spine   SPINAL SEGMENTAL MOBILITY ASSESSMENT:  Hypomobile lumbar spine   DIRECTIONAL PREFERENCE:  No preference  FUNCTIONAL TESTS:  SLS difficulty bil  GAIT: See general observation  HOME EXERCISE PROGRAM: None issued  ASSESSMENT:  CLINICAL IMPRESSION: Brian [name] is a 71 y.o. male who presents to clinic with signs and sxs consistent with intermittent L thigh pain with  indeterminate etiology.  Does not appear to be coming from the lumbar spine or locally from muscle.   He does have some deficits in balance and hip strength but these are minor.  He has a leg length discrepancy.  With an intermittent nature which cannot be reproduced (and does not fit a clear pattern), we agreed that he would not be picked up on PT caseload.  If his sxs continue or worsen detailed imaging may be warranted.    PLAN: PT FREQUENCY: single visit   Alphonzo Severance PT, DPT 10/13/2021, 1:48 PM

## 2021-10-13 ENCOUNTER — Other Ambulatory Visit: Payer: Self-pay

## 2021-10-13 ENCOUNTER — Ambulatory Visit: Payer: Medicare Other | Attending: Neurology | Admitting: Physical Therapy

## 2021-10-13 DIAGNOSIS — M79652 Pain in left thigh: Secondary | ICD-10-CM | POA: Diagnosis not present

## 2021-10-13 DIAGNOSIS — M5416 Radiculopathy, lumbar region: Secondary | ICD-10-CM | POA: Diagnosis not present

## 2021-12-08 NOTE — Progress Notes (Addendum)
Nature conservation officer at [employer] ?[address], [address] ?[place], [place] [address] ?336 [phone] ?Fax [phone] ? ?Date:  12/11/2021  ? ?Name:  Brian [name]   DOB:  [birth year]   MRN:  [id number] ? ?PCP:  [name], MD  ? ? ?Chief Complaint: Annual Exam (Concerns/ questions: pt thinks he needs a dif sleep aid/) ? ? ?History of Present Illness: ? ?Brian [name] is a 71 y.o. very pleasant male patient who presents with the following: ? ?Patient seen today for follow-up- history of A fib, dyslipidemia and depression, prediabetes  ? ?Most recent visit with myself was in October ?He does not typically follow-up with cardiology at this point.  He had an episode of A-fib with RVR during a viral infection in 2017 ?Most recent cardiology visit was in 2021, at that time Brian [name] had recently suffered a near syncopal episode with exercise during hot weather-he was given an event monitor but I do not see this result- pt reports he did complete this and there was no a fib noted over 1 month monitor ? ?He sees Product/process development scientist with orthopedics for knee pain ?Dr.Jaffe is his neurologist, most recent visit for headaches was in January ?1  Episodic tension-type headache, not intractable ?2  Possible left upper lumbar radiculopathy----------------- ?1.  Refer to physical therapy ?2.  Headache prevention:  Amitriptyline 50mg  at bedtime ?3.  Headache rescue:  Excedrin ?Dr. referred him to physical therapy for back pain- pt notes he did not need to do this as his sx resolved ? ?Labs done in October-CMP, A1c, PSA only ?Lab Results  ?Component Value Date  ? PSA 1.95 07/03/2021  ? PSA 2.60 12/05/2020  ? PSA 2.00 11/30/2019  ? ?Amitriptyline ?Aspirin 81 ?Lipitor ?Paroxetine ?Ambien used on occasion ? ?Pt notes the amitriptyline was originally used for sleep- however it seems to worsen his headaches.  He wonders if he could come off this and try something else for sleep ?He has not used trazodone or Remeron  previously ?He does have Ambien, uses this very rarely-he does not want to use it on a regular basis ? ?Patient Active Problem List  ? Diagnosis Date Noted  ? History of heat exhaustion 06/19/2020  ? Exercise intolerance 06/19/2020  ? Prediabetes 05/28/2020  ? BMI 25.0-25.9,adult 11/11/2015  ? Family history of coronary artery disease in father 10/25/2015  ? Paroxysmal atrial fibrillation (HCC) 10/11/2015  ? Dyslipidemia 10/10/2015  ? Depression   ? ? ?Past Medical History:  ?Diagnosis Date  ? Depression   ? Duodenitis 10/10/2015  ? PAF (paroxysmal atrial fibrillation) (HCC) Jan 2017  ? lone AF in setting of sepsis  ? Sepsis secondary to UTI (HCC) 10/10/2015  ? ? ?Past Surgical History:  ?Procedure Laterality Date  ? APPENDECTOMY    ? CARDIOVASCULAR STRESS TEST  01/22/2014  ? GXT - Ex 10 min (10.9 METs), HR 164 = 103% MPHR. No ST-T wave chagnes or Sx.  LOW RISK  --[facility] TM Score 10   ? HOLTER MONITOR  10/2015  ? Mostly normal sinus rhythm. Recommended avoiding stimulants, energy drinks and decongestants. No signs of A. fib.11/2015 PVCs noted in pairs, singles, bigeminy and trigeminy. No A. fib.  ? TRANSTHORACIC ECHOCARDIOGRAM  02/2014  ? Normal LV size and function.  EF 68%.  Mild MR.  Mild TR.  No pulmonary hypertension.  Blood normal.  ? VASECTOMY    ? ? ?Social History  ? ?Tobacco Use  ? Smoking status:  Never  ? Smokeless tobacco: Never  ?Vaping Use  ? Vaping Use: Never used  ?Substance Use Topics  ? Alcohol use: No  ?  Alcohol/week: 0.0 standard drinks  ? Drug use: No  ? ? ?Family History  ?Problem Relation Age of Onset  ? Cancer Mother   ? Cancer Father   ? Heart disease Father   ? Stroke Father   ? Cancer Maternal Grandmother   ? Stroke Maternal Grandfather   ? Coronary artery disease Other   ? Cancer Other   ? Stroke Other   ? Arrhythmia Other   ? ? ?Allergies  ?Allergen Reactions  ? Sulfa Antibiotics   ?  childhood  ? ? ?Medication list has been reviewed and updated. ? ?Current Outpatient Medications on File Prior to  Visit  ?Medication Sig Dispense Refill  ? aspirin 81 MG tablet Take 81 mg by mouth daily.    ? atorvastatin (LIPITOR) 10 MG tablet Take 1 tablet (10 mg total) by mouth daily. 90 tablet 3  ? Multiple Vitamin (MULTIVITAMIN) tablet Take 1 tablet by mouth daily.    ? PARoxetine (PAXIL) 30 MG tablet Take 1 tablet (30 mg total) by mouth daily. 90 tablet 3  ? ?No current facility-administered medications on file prior to visit.  ? ? ?Review of Systems: ? ?As per HPI- otherwise negative. ? ? ? ?Physical Examination: ?Vitals:  ? 12/11/21 1015  ?BP: 112/76  ?Pulse: 96  ?Resp: 18  ?Temp: 97.8 ?F (36.6 ?C)  ?SpO2: 95%  ? ?Vitals:  ? 12/11/21 1015  ?Weight: 169 lb 3.2 oz (76.7 kg)  ?Height: 5\' 8"  (1.727 m)  ? ?Body mass index is 25.73 kg/m?. ?Ideal Body Weight: Weight in (lb) to have BMI = 25: 164.1 ? ?GEN: no acute distress.  Minimal overweight, looks well ?HEENT: Atraumatic, Normocephalic.  ?Ears and Nose: No external deformity. ?CV: RRR, No M/G/R. No JVD. No thrill. No extra heart sounds. ?PULM: CTA B, no wheezes, crackles, rhonchi. No retractions. No resp. distress. No accessory muscle use. ?ABD: S, NT, ND, +BS. No rebound. No HSM. ?EXTR: No c/c/e ?PSYCH: Normally interactive. Conversant.  ? ? ?Assessment and Plan: ?Prediabetes - Plan: Basic metabolic panel, Hemoglobin A1c ? ?Dyslipidemia - Plan: Lipid panel ? ?Insomnia, unspecified type - Plan: mirtazapine (REMERON) 7.5 MG tablet, zolpidem (AMBIEN) 10 MG tablet ? ?Screening, deficiency anemia, iron - Plan: CBC ? ?Patient seen today for follow-up.  Labs are pending as above- .further ? ?Main concern today is difficulty sleeping.  Amitriptyline helps for sleep but unfortunately is worsening headaches ?We will taper off amitriptyline and started on mirtazapine 7.5 ?Discussed signs and symptoms of serotonin syndrome which he will watch out for as he is also taking fluoxetine ?He will let me know how this works for him ? ?Signed ?Abbe AmsterdamJessica Zarrah Loveland, MD ? ?Received labs as below,  message to patient ?Results for orders placed or performed in visit on 12/11/21  ?Basic metabolic panel  ?Result Value Ref Range  ? Sodium 139 135 - 145 mEq/L  ? Potassium 3.9 3.5 - 5.1 mEq/L  ? Chloride 104 96 - 112 mEq/L  ? CO2 27 19 - 32 mEq/L  ? Glucose, Bld 104 (H) 70 - 99 mg/dL  ? BUN 16 6 - 23 mg/dL  ? Creatinine, Ser 0.89 0.40 - 1.50 mg/dL  ? GFR 87.71 >60.00 mL/min  ? Calcium 9.4 8.4 - 10.5 mg/dL  ?CBC  ?Result Value Ref Range  ? WBC 5.9 4.0 - 10.5 K/uL  ?  RBC 4.81 4.22 - 5.81 Mil/uL  ? Platelets 177.0 150.0 - 400.0 K/uL  ? Hemoglobin 14.5 13.0 - 17.0 g/dL  ? HCT 42.9 39.0 - 52.0 %  ? MCV 89.0 78.0 - 100.0 fl  ? MCHC 33.9 30.0 - 36.0 g/dL  ? RDW 14.0 11.5 - 15.5 %  ?Hemoglobin A1c  ?Result Value Ref Range  ? Hgb A1c MFr Bld 6.3 4.6 - 6.5 %  ?Lipid panel  ?Result Value Ref Range  ? Cholesterol 151 0 - 200 mg/dL  ? Triglycerides 89.0 0.0 - 149.0 mg/dL  ? HDL 60.40 >39.00 mg/dL  ? VLDL 17.8 0.0 - 40.0 mg/dL  ? LDL Cholesterol 73 0 - 99 mg/dL  ? Total CHOL/HDL Ratio 3   ? NonHDL 90.66   ? ? ? ? ?

## 2021-12-08 NOTE — Patient Instructions (Addendum)
Good to see you again today! ?I will be in touch with your labs ?Let's taper off amitriptyline and try Remeron/ mirtazapine for insomnia ?Take amitriptyline 50 for 5 days, then 25 for 5 days, then stop and start on mirtazapine 7.5 mg ?Let me know how this works for you!  ?Watch for any signs or symptoms of serotonin syndrome: ?Agitation or restlessness ?Insomnia ?Confusion ?Rapid heart rate and high blood pressure ?Dilated pupils ?Loss of muscle coordination or twitching muscles ?High blood pressure ?Muscle rigidity ?Heavy sweating ?Diarrhea ?Headache ?Shivering ?Goose bumps ?If these occur stop paxil and stop mirtazapine and contact me!  ? ?Ok to use [place] on occasion- skip mirtazapine if you use this  ? ?Let me know how you do  ?

## 2021-12-11 ENCOUNTER — Ambulatory Visit (INDEPENDENT_AMBULATORY_CARE_PROVIDER_SITE_OTHER): Payer: Medicare Other | Admitting: Family Medicine

## 2021-12-11 ENCOUNTER — Encounter: Payer: Self-pay | Admitting: Family Medicine

## 2021-12-11 VITALS — BP 112/76 | HR 96 | Temp 97.8°F | Resp 18 | Ht 68.0 in | Wt 169.2 lb

## 2021-12-11 DIAGNOSIS — E785 Hyperlipidemia, unspecified: Secondary | ICD-10-CM

## 2021-12-11 DIAGNOSIS — G4700 Insomnia, unspecified: Secondary | ICD-10-CM

## 2021-12-11 DIAGNOSIS — Z130 Encounter for screening for diseases of the blood and blood-forming organs and certain disorders involving the immune mechanism: Secondary | ICD-10-CM | POA: Diagnosis not present

## 2021-12-11 DIAGNOSIS — R7303 Prediabetes: Secondary | ICD-10-CM | POA: Diagnosis not present

## 2021-12-11 LAB — HEMOGLOBIN A1C: Hgb A1c MFr Bld: 6.3 % (ref 4.6–6.5)

## 2021-12-11 LAB — BASIC METABOLIC PANEL
BUN: 16 mg/dL (ref 6–23)
CO2: 27 mEq/L (ref 19–32)
Calcium: 9.4 mg/dL (ref 8.4–10.5)
Chloride: 104 mEq/L (ref 96–112)
Creatinine, Ser: 0.89 mg/dL (ref 0.40–1.50)
GFR: 87.71 mL/min (ref >60.00)
Glucose, Bld: 104 mg/dL — ABNORMAL HIGH (ref 70–99)
Potassium: 3.9 mEq/L (ref 3.5–5.1)
Sodium: 139 mEq/L (ref 135–145)

## 2021-12-11 LAB — CBC
HCT: 42.9 % (ref 39.0–52.0)
Hemoglobin: 14.5 g/dL (ref 13.0–17.0)
MCHC: 33.9 g/dL (ref 30.0–36.0)
MCV: 89 fl (ref 78.0–100.0)
Platelets: 177 10*3/uL (ref 150.0–400.0)
RBC: 4.81 Mil/uL (ref 4.22–5.81)
RDW: 14 % (ref 11.5–15.5)
WBC: 5.9 10*3/uL (ref 4.0–10.5)

## 2021-12-11 LAB — LIPID PANEL
Cholesterol: 151 mg/dL (ref 0–200)
HDL: 60.4 mg/dL (ref >39.00)
LDL Cholesterol: 73 mg/dL (ref 0–99)
NonHDL: 90.66
Total CHOL/HDL Ratio: 3
Triglycerides: 89 mg/dL (ref 0.0–149.0)
VLDL: 17.8 mg/dL (ref 0.0–40.0)

## 2021-12-11 MED ORDER — ZOLPIDEM TARTRATE 10 MG PO TABS: ORAL_TABLET | ORAL | 0 refills | Status: DC

## 2021-12-11 MED ORDER — MIRTAZAPINE 7.5 MG PO TABS: 7.5000 mg | ORAL_TABLET | Freq: Every day | ORAL | 3 refills | Status: DC

## 2022-01-12 ENCOUNTER — Other Ambulatory Visit: Payer: Self-pay | Admitting: Family Medicine

## 2022-01-12 DIAGNOSIS — F32A Depression, unspecified: Secondary | ICD-10-CM

## 2022-02-07 ENCOUNTER — Telehealth: Payer: Self-pay | Admitting: Family Medicine

## 2022-02-07 NOTE — Telephone Encounter (Signed)
Pt scheduled depression appointment with [facility] but states it's nothing serious and he's okay with his appointment time/date

## 2022-02-17 NOTE — Patient Instructions (Addendum)
It was good to see you today- I will be in touch with your labs asap As we discussed you are in A fib with rapid ventricular response (RVR) - we will do 2 things - start xarelto for blood clot prevention -diltiazem daily to help slow down your heart  Let me know if you don't hear from cardiology in the next day or so Please let me know if you have any concerns at all Once we figure out your heart we can go up on your paxil if you need

## 2022-02-17 NOTE — Progress Notes (Addendum)
[facility] [address], [address] [place], [place] [address] [phone] [phone]  Date:  02/19/2022   Name:  Brian BellowWilliam Addo   DOB:  01/16/1951   MRN:  [id number]  PCP:  Pearline Cablesopland, Gelene Recktenwald C, MD    Chief Complaint: Depression   History of Present Illness:  Brian [name] is a 71 y.o. very pleasant male patient who presents with the following:  Patient seen today to discuss depression-  history of A fib, dyslipidemia and depression, prediabetes    Most recent visit with myself was in March-that time he was taking amitriptyline, but it was causing worsening headaches.  We had him taper off this and started on mirtazapine 7.5 for sleep- this does work for him most of the time, he notes he sleeps well 6 nights out of 7 He then called us and scheduled this appointment to discuss depression last month  He is taking paxil 30 mg right now- he was on 20 mg at first and then went to 30 about 10 years ago.  Generally this is worked well for him, but he has been under more stress recently His FIL died last year, he finished a big project at home and feels at loose ends, the estate has been really tough to settle He notes he is feeling very low energy, he does not feel excited about the next day. No SI   He re-started exercise and the gym and physically he is feeling improved He is starting counseling next week   Incidentally noted to be in atrial fib with RVR today-rate about 130 He denies any chest pain or shortness of breath, he has not been aware of being in A-fib.  No palpitations or other symptoms  He was last seen by cardiology- Dr Herbie BaltimoreHarding 11/21- he had a fib and RVR in setting of GI illness in 2017.  He had some near syncope thought due to heat exhaustion in 2021 which is why he saw Dr Herbie BaltimoreHarding at that time   No current cardiac meds except for aspirin 81    Wt Readings from Last 3 Encounters:  02/19/22 175 lb 3.2 oz (79.5 kg)  12/11/21 169  lb 3.2 oz (76.7 kg)  09/25/21 171 lb (77.6 kg)     Patient Active Problem List   Diagnosis Date Noted   History of heat exhaustion 06/19/2020   Exercise intolerance 06/19/2020   Prediabetes 05/28/2020   BMI 25.0-25.9,adult 11/11/2015   Family history of coronary artery disease in father 10/25/2015   Paroxysmal atrial fibrillation (HCC) 10/11/2015   Dyslipidemia 10/10/2015   Depression     Past Medical History:  Diagnosis Date   Depression    Duodenitis 10/10/2015   PAF (paroxysmal atrial fibrillation) (HCC) Jan 2017   lone AF in setting of sepsis   Sepsis secondary to UTI (HCC) 10/10/2015    Past Surgical History:  Procedure Laterality Date   APPENDECTOMY     CARDIOVASCULAR STRESS TEST  01/22/2014   GXT - Ex 10 min (10.9 METs), HR 164 = 103% MPHR. No ST-T wave chagnes or Sx.  LOW RISK  --[facility] TM Score 10    HOLTER MONITOR  10/2015   Mostly normal sinus rhythm. Recommended avoiding stimulants, energy drinks and decongestants. No signs of A. fib.Marland Kitchen. PVCs noted in pairs, singles, bigeminy and trigeminy. No A. fib.   TRANSTHORACIC ECHOCARDIOGRAM  02/2014   Normal LV size and function.  EF 68%.  Mild MR.  Mild TR.  No pulmonary hypertension.  Blood normal.   VASECTOMY      Social History   Tobacco Use   Smoking status: Never   Smokeless tobacco: Never  Vaping Use   Vaping Use: Never used  Substance Use Topics   Alcohol use: No    Alcohol/week: 0.0 standard drinks   Drug use: No    Family History  Problem Relation Age of Onset   Cancer Mother    Cancer Father    Heart disease Father    Stroke Father    Cancer Maternal Grandmother    Stroke Maternal Grandfather    Coronary artery disease Other    Cancer Other    Stroke Other    Arrhythmia Other     Allergies  Allergen Reactions   Sulfa Antibiotics     childhood    Medication list has been reviewed and updated.  Current Outpatient Medications on File Prior to Visit  Medication Sig Dispense Refill   aspirin  81 MG tablet Take 81 mg by mouth daily.     atorvastatin (LIPITOR) 10 MG tablet Take 1 tablet (10 mg total) by mouth daily. 90 tablet 3   mirtazapine (REMERON) 7.5 MG tablet Take 1 tablet (7.5 mg total) by mouth at bedtime. 30 tablet 3   Multiple Vitamin (MULTIVITAMIN) tablet Take 1 tablet by mouth daily.     PARoxetine (PAXIL) 30 MG tablet TAKE 1 TABLET BY MOUTH EVERY DAY 90 tablet 3   No current facility-administered medications on file prior to visit.    Review of Systems:  As per HPI- otherwise negative.   Physical Examination: Vitals:   02/19/22 1034  BP: 123/60  Pulse: (!) 147  Resp: 18  Temp: 97.7 F (36.5 C)  SpO2: 94%   Vitals:   02/19/22 1034  Weight: 175 lb 3.2 oz (79.5 kg)  Height: 5\' 8"  (1.727 m)   Body mass index is 26.64 kg/m. Ideal Body Weight: Weight in (lb) to have BMI = 25: 164.1  GEN: no acute distress.  Minimal overweight, looks well HEENT: Atraumatic, Normocephalic.  Ears and Nose: No external deformity. CV: Rapid and regular pulse, No M/G/R. No JVD. No thrill. No extra heart sounds. PULM: CTA B, no wheezes, crackles, rhonchi. No retractions. No resp. distress. No accessory muscle use. ABD: S, NT, ND, +BS. No rebound. No HSM. EXTR: No c/c/e PSYCH: Normally interactive. Conversant.   Atrial fibs, rate about 130  Assessment and Plan: Paroxysmal atrial fibrillation (HCC) - Plan: CBC, Comprehensive metabolic panel, TSH, diltiazem (CARDIZEM CD) 180 MG 24 hr capsule, rivaroxaban (XARELTO) 20 MG TABS tablet, Protime-INR  Tachycardia - Plan: EKG 12-Lead  Depression, unspecified depression type  Patient seen today to discuss depression.  However, incidentally he was found to be in atrial for with RVR.  I called and discussed with his cardiologist Dr. .  Patient is really asymptomatic, no chest pain or shortness of breath.  Pt is offered emergency department, but would prefer to treat outpatient if possible We decided to start Xarelto 20 mg,  diltiazem XR 180.  Cardiology will bring him into atrial fib clinic ASAP Obtain lab work as above He is instructed to seek care right away if any chest pain, shortness of breath should occur  Bill would prefer to stick with current dose of Paxil until his heart situation is resolved, this is certainly fine.  He will be in touch with me as needed   Signed Herbie Baltimore, MD  Received pt  labs as below, message to pt  Results for orders placed or performed in visit on 02/19/22  CBC  Result Value Ref Range   WBC 4.8 4.0 - 10.5 K/uL   RBC 4.78 4.22 - 5.81 Mil/uL   Platelets 172.0 150.0 - 400.0 K/uL   Hemoglobin 14.4 13.0 - 17.0 g/dL   HCT 40.8 14.4 - 81.8 %   MCV 90.1 78.0 - 100.0 fl   MCHC 33.5 30.0 - 36.0 g/dL   RDW 56.3 14.9 - 70.2 %  Comprehensive metabolic panel  Result Value Ref Range   Sodium 142 135 - 145 mEq/L   Potassium 4.1 3.5 - 5.1 mEq/L   Chloride 106 96 - 112 mEq/L   CO2 28 19 - 32 mEq/L   Glucose, Bld 105 (H) 70 - 99 mg/dL   BUN 16 6 - 23 mg/dL   Creatinine, Ser 6.37 0.40 - 1.50 mg/dL   Total Bilirubin 0.5 0.2 - 1.2 mg/dL   Alkaline Phosphatase 116 39 - 117 U/L   AST 23 0 - 37 U/L   ALT 24 0 - 53 U/L   Total Protein 6.6 6.0 - 8.3 g/dL   Albumin 4.1 3.5 - 5.2 g/dL   GFR 85.88 >50.27 mL/min   Calcium 9.3 8.4 - 10.5 mg/dL  TSH  Result Value Ref Range   TSH 1.46 0.35 - 5.50 uIU/mL

## 2022-02-19 ENCOUNTER — Telehealth: Payer: Self-pay | Admitting: Cardiology

## 2022-02-19 ENCOUNTER — Encounter: Payer: Self-pay | Admitting: Family Medicine

## 2022-02-19 ENCOUNTER — Ambulatory Visit (INDEPENDENT_AMBULATORY_CARE_PROVIDER_SITE_OTHER): Payer: Medicare Other | Admitting: Family Medicine

## 2022-02-19 VITALS — BP 123/60 | HR 147 | Temp 97.7°F | Resp 18 | Ht 68.0 in | Wt 175.2 lb

## 2022-02-19 DIAGNOSIS — I480 Paroxysmal atrial fibrillation: Secondary | ICD-10-CM | POA: Diagnosis not present

## 2022-02-19 DIAGNOSIS — R000 Tachycardia, unspecified: Secondary | ICD-10-CM

## 2022-02-19 DIAGNOSIS — F32A Depression, unspecified: Secondary | ICD-10-CM | POA: Diagnosis not present

## 2022-02-19 LAB — CBC
HCT: 43 % (ref 39.0–52.0)
Hemoglobin: 14.4 g/dL (ref 13.0–17.0)
MCHC: 33.5 g/dL (ref 30.0–36.0)
MCV: 90.1 fl (ref 78.0–100.0)
Platelets: 172 10*3/uL (ref 150.0–400.0)
RBC: 4.78 Mil/uL (ref 4.22–5.81)
RDW: 14.3 % (ref 11.5–15.5)
WBC: 4.8 10*3/uL (ref 4.0–10.5)

## 2022-02-19 LAB — COMPREHENSIVE METABOLIC PANEL
ALT: 24 U/L (ref 0–53)
AST: 23 U/L (ref 0–37)
Albumin: 4.1 g/dL (ref 3.5–5.2)
Alkaline Phosphatase: 116 U/L (ref 39–117)
BUN: 16 mg/dL (ref 6–23)
CO2: 28 mEq/L (ref 19–32)
Calcium: 9.3 mg/dL (ref 8.4–10.5)
Chloride: 106 mEq/L (ref 96–112)
Creatinine, Ser: 0.98 mg/dL (ref 0.40–1.50)
GFR: 78.82 mL/min (ref >60.00)
Glucose, Bld: 105 mg/dL — ABNORMAL HIGH (ref 70–99)
Potassium: 4.1 mEq/L (ref 3.5–5.1)
Sodium: 142 mEq/L (ref 135–145)
Total Bilirubin: 0.5 mg/dL (ref 0.2–1.2)
Total Protein: 6.6 g/dL (ref 6.0–8.3)

## 2022-02-19 LAB — TSH: TSH: 1.46 u[IU]/mL (ref 0.35–5.50)

## 2022-02-19 MED ORDER — DILTIAZEM HCL ER COATED BEADS 180 MG PO CP24: 180.0000 mg | ORAL_CAPSULE | Freq: Every day | ORAL | 6 refills | Status: DC

## 2022-02-19 MED ORDER — RIVAROXABAN 20 MG PO TABS: 20.0000 mg | ORAL_TABLET | Freq: Every day | ORAL | 5 refills | Status: DC

## 2022-02-19 NOTE — Telephone Encounter (Signed)
Late entry - @ 11 am  Dr Dallas Schimke called into the office and spoke to [name] ( DOD) . Per [name]  Recommend to Dr Dallas Schimke to start Diltiazem  and  once day DOAC .   Will  arrange for patient to follow up with [facility]  1 to 2 weeks.  To schedule-- Possible cardioversion.

## 2022-02-19 NOTE — Telephone Encounter (Signed)
Dr. Calling in regards to pt. Call transferred to DOD, Dr Ellyn Hack. Please advise

## 2022-02-19 NOTE — Telephone Encounter (Signed)
RN  called patient. Informed patient of the  upcoming appointment with the [facility]  February 22, 2022 with  [facility] PA.   Patient verbalized understanding.   Patient states he has picked up new medication from this morning appointment with Dr Patsy Lager and started them.

## 2022-02-22 ENCOUNTER — Encounter (HOSPITAL_COMMUNITY): Payer: Self-pay | Admitting: Physician Assistant

## 2022-02-22 ENCOUNTER — Ambulatory Visit (HOSPITAL_COMMUNITY)
Admission: RE | Admit: 2022-02-22 | Discharge: 2022-02-22 | Disposition: A | Discharge status: Home / Self Care | Payer: Medicare Other | Source: Ambulatory Visit | Attending: Physician Assistant | Admitting: Physician Assistant

## 2022-02-22 VITALS — BP 114/70 | HR 91 | Ht 68.0 in | Wt 175.4 lb

## 2022-02-22 DIAGNOSIS — Z823 Family history of stroke: Secondary | ICD-10-CM | POA: Diagnosis not present

## 2022-02-22 DIAGNOSIS — E785 Hyperlipidemia, unspecified: Secondary | ICD-10-CM | POA: Insufficient documentation

## 2022-02-22 DIAGNOSIS — I480 Paroxysmal atrial fibrillation: Secondary | ICD-10-CM

## 2022-02-22 DIAGNOSIS — Z79899 Other long term (current) drug therapy: Secondary | ICD-10-CM | POA: Insufficient documentation

## 2022-02-22 DIAGNOSIS — Z7901 Long term (current) use of anticoagulants: Secondary | ICD-10-CM | POA: Diagnosis not present

## 2022-02-22 NOTE — Patient Instructions (Signed)
Kardia

## 2022-02-22 NOTE — Progress Notes (Addendum)
Primary Care Physician: [name], MD Primary Cardiologist: [name] Primary Electrophysiologist: none Referring Physician: Dr Clemon Chambers Brian [name] is a 71 y.o. male with a history of HLD and atrial fibrillation who presents for consultation in the [facility].  The patient was initially diagnosed with atrial fibrillation in 2017 in the setting of viral duodenitis. Patient has a CHADS2VASC score of 1. He was in his usual state of health until he saw his PCP on 02/19/22 and was incidentally noted to be in afib with RVR. [name] was notified and patient was started on Xarelto 20 mg daily and diltiazem 180 mg daily. He is back in SR today. He had no awareness of his arrhythmia. No bleeding issues on anticoagulation.   Today, he denies symptoms of palpitations, chest pain, shortness of breath, orthopnea, PND, lower extremity edema, dizziness, presyncope, syncope, snoring, daytime somnolence, bleeding, or neurologic sequela. The patient is tolerating medications without difficulties and is otherwise without complaint today.    Atrial Fibrillation Risk Factors:  he does not have symptoms or diagnosis of sleep apnea. he does not have a history of rheumatic fever. he does not have a history of alcohol use. The patient does have a history of early familial atrial fibrillation or other arrhythmias. Father had afib.  he has a BMI of Body mass index is 26.67 kg/m.Marland Kitchen Filed Weights   02/22/22 1316  Weight: 79.6 kg    Family History  Problem Relation Age of Onset   Cancer Mother    Cancer Father    Heart disease Father    Stroke Father    Cancer Maternal Grandmother    Stroke Maternal Grandfather    Coronary artery disease Other    Cancer Other    Stroke Other    Arrhythmia Other      Atrial Fibrillation Management history:  Previous antiarrhythmic drugs: none Previous cardioversions: none Previous ablations: none CHADS2VASC score:  1 Anticoagulation history: Xarelto    Past Medical History:  Diagnosis Date   Depression    Duodenitis 10/10/2015   PAF (paroxysmal atrial fibrillation) (HCC) Jan 2017   lone AF in setting of sepsis   Sepsis secondary to UTI (HCC) 10/10/2015   Past Surgical History:  Procedure Laterality Date   APPENDECTOMY     CARDIOVASCULAR STRESS TEST  01/22/2014   GXT - Ex 10 min (10.9 METs), HR 164 = 103% MPHR. No ST-T wave chagnes or Sx.  LOW RISK  --[facility] TM Score 10    HOLTER MONITOR  10/2015   Mostly normal sinus rhythm. Recommended avoiding stimulants, energy drinks and decongestants. No signs of A. fib.Marland Kitchen PVCs noted in pairs, singles, bigeminy and trigeminy. No A. fib.   TRANSTHORACIC ECHOCARDIOGRAM  02/2014   Normal LV size and function.  EF 68%.  Mild MR.  Mild TR.  No pulmonary hypertension.  Blood normal.   VASECTOMY      Current Outpatient Medications  Medication Sig Dispense Refill   atorvastatin (LIPITOR) 10 MG tablet Take 1 tablet (10 mg total) by mouth daily. 90 tablet 3   diltiazem (CARDIZEM CD) 180 MG 24 hr capsule Take 1 capsule (180 mg total) by mouth daily. 30 capsule 6   mirtazapine (REMERON) 7.5 MG tablet Take 1 tablet (7.5 mg total) by mouth at bedtime. 30 tablet 3   Multiple Vitamin (MULTIVITAMIN) tablet Take 1 tablet by mouth daily.     PARoxetine (PAXIL) 30 MG tablet TAKE 1 TABLET BY MOUTH  EVERY DAY 90 tablet 3   rivaroxaban (XARELTO) 20 MG TABS tablet Take 1 tablet (20 mg total) by mouth daily with supper. 30 tablet 5   No current facility-administered medications for this encounter.    Allergies  Allergen Reactions   Sulfa Antibiotics     childhood    Social History   Socioeconomic History   Marital status: Married    Spouse name: Dallas Tetteh   Number of children: 2   Years of education: College   Highest education level: Not on file  Occupational History   Occupation: retired Recruitment consultant  Tobacco Use   Smoking status: Never   Smokeless tobacco: Never    Tobacco comments:    Never smoke 02/22/22  Vaping Use   Vaping Use: Never used  Substance and Sexual Activity   Alcohol use: No    Alcohol/week: 0.0 standard drinks of alcohol   Drug use: No   Sexual activity: Yes    Partners: Female  Other Topics Concern   Not on file  Social History Narrative   He is married to [name] - who is a Software engineer.      He is a retired Engineer, structural, he has 2 school-aged grandchildren who are now 72 and 26.        He enjoys helping to take care of them, building furniture and volunteering with church and [place].  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- this is still up and running       Exercise- silver sneakers   Left handed   Two story home   Social Determinants of Health   Financial Resource Strain: Low Risk  (03/21/2021)   Overall Financial Resource Strain (CARDIA)    Difficulty of Paying Living Expenses: Not hard at all  Food Insecurity: No Food Insecurity (03/21/2021)   Hunger Vital Sign    Worried About Running Out of Food in the Last Year: Never true    [place] in the Last Year: Never true  Transportation Needs: No Transportation Needs (03/21/2021)   PRAPARE - Hydrologist (Medical): No    Lack of Transportation (Non-Medical): No  Physical Activity: Sufficiently Active (03/21/2021)   Exercise Vital Sign    Days of Exercise per Week: 7 days    Minutes of Exercise per Session: 30 min  Stress: No Stress Concern Present (03/21/2021)   [place]    Feeling of Stress : Not at all  Social Connections: [place] (03/21/2021)   Social Connection and Isolation Panel [NHANES]    Frequency of Communication with Friends and Family: More than three times a week    Frequency of Social  Gatherings with Friends and Family: More than three times a week    Attends Religious Services: More than 4 times per year    Active Member of [employer] or Organizations: Yes    Attends Archivist Meetings: More than 4 times per year    Marital Status: Married  Human resources officer Violence: Not At Risk (03/21/2021)   Humiliation, Afraid, Rape, and Kick questionnaire    Fear of Current or Ex-Partner: No    Emotionally Abused: No    Physically Abused: No    Sexually Abused: No  ROS- All systems are reviewed and negative except as per the HPI above.  Physical Exam: Vitals:   02/22/22 1316  BP: 114/70  Pulse: 91  Weight: 79.6 kg  Height: 5\' 8"  (1.727 m)    GEN- The patient is a well appearing male, alert and oriented x 3 today.   Head- normocephalic, atraumatic Eyes-  Sclera clear, conjunctiva pink Ears- hearing intact Oropharynx- clear Neck- supple  Lungs- Clear to ausculation bilaterally, normal work of breathing Heart- Regular rate and rhythm, no murmurs, rubs or gallops  GI- soft, NT, ND, + BS Extremities- no clubbing, cyanosis, or edema MS- no significant deformity or atrophy Skin- no rash or lesion Psych- euthymic mood, full affect Neuro- strength and sensation are intact  Wt Readings from Last 3 Encounters:  02/22/22 79.6 kg  02/19/22 79.5 kg  12/11/21 76.7 kg    EKG today demonstrates  SR, PACs Vent. rate 91 BPM PR interval 180 ms QRS duration 88 ms QT/QTcB 360/442 ms   Echo 03/11/14 demonstrated  EF 68% Mild MR Mild TR  Epic records are reviewed at length today  CHA2DS2-VASc Score = 1  The patient's score is based upon: CHF History: 0 HTN History: 0 Diabetes History: 0 Stroke History: 0 Vascular Disease History: 0 Age Score: 1 Gender Score: 0       ASSESSMENT AND PLAN: 1. Paroxysmal Atrial Fibrillation (ICD10:  I48.0) The patient's CHA2DS2-VASc score is 1, indicating a 0.6% annual risk of stroke.   General education about afib  provided and questions answered. We also discussed his stroke risk and the risks and benefits of anticoagulation. Patient would like to continue on anticoagulation given his unknown burden of afib and his family history of CVA.  We discussed [employer] mobile for home monitoring.  Check echocardiogram Continue Xarelto 20 mg daily Continue diltiazem 180 mg daily If he has recurrence of his afib, he may be a good candidate for front line ablation.    Follow up in the AF clinic in one month.    [facility] [address] [place], [place] [address] [phone] 02/22/2022 1:22 PM

## 2022-03-05 ENCOUNTER — Ambulatory Visit (HOSPITAL_COMMUNITY)
Admission: RE | Admit: 2022-03-05 | Discharge: 2022-03-05 | Disposition: A | Discharge status: Home / Self Care | Payer: Medicare Other | Source: Ambulatory Visit | Attending: Physician Assistant | Admitting: Physician Assistant

## 2022-03-05 DIAGNOSIS — I480 Paroxysmal atrial fibrillation: Secondary | ICD-10-CM | POA: Diagnosis not present

## 2022-03-05 LAB — ECHOCARDIOGRAM COMPLETE
Area-P 1/2: 3.46 cm2
S' Lateral: 2.7 cm

## 2022-03-15 ENCOUNTER — Other Ambulatory Visit: Payer: Self-pay | Admitting: Family Medicine

## 2022-03-15 DIAGNOSIS — G4700 Insomnia, unspecified: Secondary | ICD-10-CM

## 2022-03-22 ENCOUNTER — Telehealth: Payer: Self-pay | Admitting: Family Medicine

## 2022-03-22 NOTE — Telephone Encounter (Signed)
Left message for patient to call back and schedule Medicare Annual Wellness Visit (AWV).   Please offer to do virtually or by telephone.  Left office number and my jabber #[phone].  Last AWV:03/21/2021  Please schedule at anytime with Nurse Health Advisor.   

## 2022-03-26 ENCOUNTER — Ambulatory Visit (HOSPITAL_COMMUNITY): Payer: Medicare Other | Admitting: Physician Assistant

## 2022-04-02 ENCOUNTER — Ambulatory Visit (HOSPITAL_COMMUNITY): Payer: Medicare Other | Admitting: Physician Assistant

## 2022-04-02 ENCOUNTER — Encounter (HOSPITAL_COMMUNITY): Payer: Self-pay

## 2022-04-03 ENCOUNTER — Emergency Department (HOSPITAL_BASED_OUTPATIENT_CLINIC_OR_DEPARTMENT_OTHER): Payer: Medicare Other | Admitting: Radiology

## 2022-04-03 ENCOUNTER — Emergency Department (HOSPITAL_BASED_OUTPATIENT_CLINIC_OR_DEPARTMENT_OTHER)
Admission: EM | Admit: 2022-04-03 | Discharge: 2022-04-03 | Disposition: A | Discharge status: Home / Self Care | Payer: Medicare Other | Attending: Emergency Medicine | Admitting: Emergency Medicine

## 2022-04-03 ENCOUNTER — Other Ambulatory Visit: Payer: Self-pay

## 2022-04-03 ENCOUNTER — Encounter (HOSPITAL_BASED_OUTPATIENT_CLINIC_OR_DEPARTMENT_OTHER): Payer: Self-pay | Admitting: Emergency Medicine

## 2022-04-03 ENCOUNTER — Other Ambulatory Visit (HOSPITAL_BASED_OUTPATIENT_CLINIC_OR_DEPARTMENT_OTHER): Payer: Self-pay

## 2022-04-03 DIAGNOSIS — Z7901 Long term (current) use of anticoagulants: Secondary | ICD-10-CM | POA: Diagnosis not present

## 2022-04-03 DIAGNOSIS — R000 Tachycardia, unspecified: Secondary | ICD-10-CM

## 2022-04-03 DIAGNOSIS — I491 Atrial premature depolarization: Secondary | ICD-10-CM | POA: Insufficient documentation

## 2022-04-03 DIAGNOSIS — I480 Paroxysmal atrial fibrillation: Secondary | ICD-10-CM | POA: Diagnosis not present

## 2022-04-03 DIAGNOSIS — R002 Palpitations: Secondary | ICD-10-CM | POA: Diagnosis present

## 2022-04-03 LAB — CBC
HCT: 46.5 % (ref 39.0–52.0)
Hemoglobin: 15.8 g/dL (ref 13.0–17.0)
MCH: 30 pg (ref 26.0–34.0)
MCHC: 34 g/dL (ref 30.0–36.0)
MCV: 88.2 fL (ref 80.0–100.0)
Platelets: 192 10*3/uL (ref 150–400)
RBC: 5.27 MIL/uL (ref 4.22–5.81)
RDW: 14 % (ref 11.5–15.5)
WBC: 6.5 10*3/uL (ref 4.0–10.5)
nRBC: 0 % (ref 0.0–0.2)

## 2022-04-03 LAB — BASIC METABOLIC PANEL
Anion gap: 11 (ref 5–15)
BUN: 13 mg/dL (ref 8–23)
CO2: 21 mmol/L — ABNORMAL LOW (ref 22–32)
Calcium: 9.6 mg/dL (ref 8.9–10.3)
Chloride: 105 mmol/L (ref 98–111)
Creatinine, Ser: 0.98 mg/dL (ref 0.61–1.24)
GFR, Estimated: >60 mL/min (ref >60)
Glucose, Bld: 107 mg/dL — ABNORMAL HIGH (ref 70–99)
Potassium: 4.1 mmol/L (ref 3.5–5.1)
Sodium: 137 mmol/L (ref 135–145)

## 2022-04-03 LAB — TSH: TSH: 2.136 u[IU]/mL (ref 0.350–4.500)

## 2022-04-03 LAB — MAGNESIUM: Magnesium: 1.9 mg/dL (ref 1.7–2.4)

## 2022-04-03 LAB — TROPONIN I (HIGH SENSITIVITY): Troponin I (High Sensitivity): 6 ng/L (ref <18)

## 2022-04-03 MED ORDER — DILTIAZEM HCL ER COATED BEADS 120 MG PO CP24
240.0000 mg | ORAL_CAPSULE | Freq: Once | ORAL | Status: AC
  Administered 2022-04-03: 240 mg via ORAL
  Filled 2022-04-03: qty 2

## 2022-04-03 MED ORDER — DILTIAZEM HCL 25 MG/5ML IV SOLN
10.0000 mg | Freq: Once | INTRAVENOUS | Status: DC
  Filled 2022-04-03: qty 5

## 2022-04-03 MED ORDER — DILTIAZEM HCL ER COATED BEADS 240 MG PO CP24
240.0000 mg | ORAL_CAPSULE | Freq: Every day | ORAL | 1 refills | Status: DC
  Filled 2022-04-03: qty 30, 30d supply, fill #0

## 2022-04-03 MED ORDER — DILTIAZEM HCL ER COATED BEADS 180 MG PO CP24: 180.0000 mg | ORAL_CAPSULE | Freq: Once | ORAL | Status: DC

## 2022-04-03 NOTE — ED Triage Notes (Signed)
Pt arrives to ED with c/o Afib, weakness, lightheadedness, and weakness over the past 24 hours. Pt reports HRs that were tracked on his phone range from 100's to 160s.

## 2022-04-03 NOTE — ED Provider Notes (Signed)
[facility] EMERGENCY DEPT Provider Note   CSN: [id number] Arrival date & time: 04/03/22  0947      History  Chief Complaint  Patient presents with   Atrial Fibrillation   Patient Active Problem List   Diagnosis Date Noted   History of heat exhaustion 06/19/2020   Exercise intolerance 06/19/2020   Prediabetes 05/28/2020   BMI 25.0-25.9,adult 11/11/2015   Family history of coronary artery disease in father 10/25/2015   Paroxysmal atrial fibrillation (HCC) 10/11/2015   Dyslipidemia 10/10/2015   Depression      Brian [name] is a 71 y.o. male with history of paroxysmal atrial fibrillation on anticoagulation, hyperlipidemia presenting with palpitations.  Patient reports palpitations for 1 day.  He reports associated generalized weakness, lightheadedness.  Denies any syncope.  Denies chest pain.  He reports occasional mild shortness of breath but denies this currently.  No headaches.  His ambulating at baseline.  Did not take his diltiazem today.  Denies any fevers, chills, abdominal pain, dysuria, cough, runny nose, sore throat.  Symptoms are intermittent.   Atrial Fibrillation Associated symptoms include shortness of breath. Pertinent negatives include no chest pain.       Home Medications Prior to Admission medications   Medication Sig Start Date End Date Taking? Authorizing Provider  atorvastatin (LIPITOR) 10 MG tablet Take 1 tablet (10 mg total) by mouth daily. 07/03/21   Copland, Gwenlyn Found, MD  diltiazem (CARDIZEM CD) 240 MG 24 hr capsule Take 1 capsule (240 mg total) by mouth daily. 04/03/22   Lonell Grandchild, MD  mirtazapine (REMERON) 7.5 MG tablet TAKE 1 TABLET BY MOUTH AT BEDTIME 03/15/22   Copland, Gwenlyn Found, MD  Multiple Vitamin (MULTIVITAMIN) tablet Take 1 tablet by mouth daily.    [provider]  PARoxetine (PAXIL) 30 MG tablet TAKE 1 TABLET BY MOUTH EVERY DAY 01/13/22   Copland, Gwenlyn Found, MD  rivaroxaban (XARELTO) 20 MG TABS tablet  Take 1 tablet (20 mg total) by mouth daily with supper. 02/19/22   Copland, Gwenlyn Found, MD      Allergies    Sulfa antibiotics    Review of Systems   Review of Systems  Constitutional:  Positive for fatigue and fever.  Respiratory:  Positive for shortness of breath.   Cardiovascular:  Positive for palpitations. Negative for chest pain and leg swelling.  All other systems reviewed and are negative.   Physical Exam Updated Vital Signs BP 125/85   Pulse 99   Temp 99.3 F (37.4 C) (Oral)   Resp 16   Ht 5\' 8"  (1.727 m)   Wt 79.6 kg   SpO2 100%   BMI 26.68 kg/m  Physical Exam Vitals and nursing note reviewed.  Constitutional:      General: He is not in acute distress.    Appearance: Normal appearance.  HENT:     Mouth/Throat:     Mouth: Mucous membranes are moist.  Cardiovascular:     Rate and Rhythm: Tachycardia present. Rhythm irregular.     Heart sounds: No murmur heard. Pulmonary:     Effort: Pulmonary effort is normal. No respiratory distress.     Breath sounds: Normal breath sounds.  Abdominal:     General: Abdomen is flat.     Palpations: Abdomen is soft.     Tenderness: There is no abdominal tenderness.  Musculoskeletal:     Right lower leg: No edema.     Left lower leg: No edema.  Skin:    General: Skin is  warm and dry.     Capillary Refill: Capillary refill takes less than 2 seconds.  Neurological:     Mental Status: He is alert and oriented to person, place, and time. Mental status is at baseline.  Psychiatric:        Mood and Affect: Mood normal.        Behavior: Behavior normal.     ED Results / Procedures / Treatments   Labs (all labs ordered are listed, but only abnormal results are displayed) Labs Reviewed  BASIC METABOLIC PANEL - Abnormal; Notable for the following components:      Result Value   CO2 21 (*)    Glucose, Bld 107 (*)    All other components within normal limits  MAGNESIUM  CBC  TSH  TROPONIN I (HIGH SENSITIVITY)     EKG EKG Interpretation  Date/Time:  Tuesday April 03 2022 10:54:55 EDT Ventricular Rate:  94 PR Interval:  174 QRS Duration: 79 QT Interval:  330 QTC Calculation: 413 R Axis:   -49 Text Interpretation: Sinus tachycardia Atrial premature complexes LAD, consider left anterior fascicular block Low voltage, precordial leads Confirmed by Alvino Blood ([id number]) on 04/03/2022 11:01:42 AM  Radiology DG Chest 1 View  Result Date: 04/03/2022 CLINICAL DATA:  Dyspnea. EXAM: CHEST  1 VIEW COMPARISON:  November 11, 2015. FINDINGS: The heart size and mediastinal contours are within normal limits. Both lungs are clear. The visualized skeletal structures are unremarkable. IMPRESSION: No active disease. Electronically Signed   By: Lupita Raider M.D.   On: 04/03/2022 10:47    Procedures .1-3 Lead EKG Interpretation  Performed by: Lonell Grandchild, MD Authorized by: Lonell Grandchild, MD     Interpretation: abnormal     ECG rate:  110   ECG rate assessment: tachycardic     Rhythm: atrial fibrillation     Ectopy: PAC     Conduction: normal       Medications Ordered in ED Medications  diltiazem (CARDIZEM CD) 24 hr capsule 240 mg (240 mg Oral Given 04/03/22 1101)    ED Course/ Medical Decision Making/ A&P Clinical Course as of 04/03/22 1209  Tue Apr 03, 2022  1050 Pulse Rate(!): 52 Not accurate  [WS]  1121 Troponin I (High Sensitivity): 6 [WS]  1122 CXR without signs of CHF, pneumonia [WS]  1206 Patient feeling better. HR <100 at bedside. Will increase diltiazem ER dosing to 240 from 180. Will discharge patient to home. All questions answered. Patient comfortable with plan of discharge. Return precautions discussed with patient and specified on the after visit summary.  [WS]    Clinical Course User Index [WS] Lonell Grandchild, MD                           Medical Decision Making Amount and/or Complexity of Data Reviewed Labs: ordered. Decision-making details documented  in ED Course. Radiology: ordered.  Risk Prescription drug management.   71 year old presenting to the emergency department with palpitations.  Patient well-appearing, vitals notable for intermittent tachycardia.  Exam otherwise reassuring.  Monitor showing irregular heartbeat, appears to be atrial fibrillation, EKG however with normal sinus rhythm with frequent PACs.  Repeated without evidence of atrial fibrillation.  Suspect ongoing paroxysmal atrial fibrillation.  Will give diltiazem here, increase patient's home dose as he reports his heart rate at home when taking this medication is 90 and he has a monitor with multiple events of tachycardia up to 150 which  I reviewed.  Given dyspnea will check troponin to evaluate for ACS but very low concern, EKG without concerning ST or T wave changes for ischemia.  Low concern for CHF, thyroid storm, pneumonia, occult infectious process.  If laboratory work-up reassuring, likely discharge with close outpatient follow-up with cardiology. Reviewed prior records including prior note from PA Fenton in Afib clinic, prior labs, echocardiogram.    Final Clinical Impression(s) / ED Diagnoses Final diagnoses:  Tachycardia  Paroxysmal A-fib (HCC)  PAC (premature atrial contraction)    Rx / DC Orders ED Discharge Orders          Ordered    diltiazem (CARDIZEM CD) 240 MG 24 hr capsule  Daily        04/03/22 1207              Cristie Hem, MD 04/03/22 1209

## 2022-04-10 ENCOUNTER — Ambulatory Visit (HOSPITAL_COMMUNITY): Payer: Medicare Other | Admitting: Physician Assistant

## 2022-04-10 ENCOUNTER — Ambulatory Visit (HOSPITAL_COMMUNITY)
Admission: RE | Admit: 2022-04-10 | Discharge: 2022-04-10 | Disposition: A | Discharge status: Home / Self Care | Payer: Medicare Other | Source: Ambulatory Visit | Attending: Physician Assistant | Admitting: Physician Assistant

## 2022-04-10 ENCOUNTER — Encounter (HOSPITAL_COMMUNITY): Payer: Self-pay | Admitting: Physician Assistant

## 2022-04-10 VITALS — BP 114/74 | HR 85 | Ht 68.0 in | Wt 171.0 lb

## 2022-04-10 DIAGNOSIS — Z7901 Long term (current) use of anticoagulants: Secondary | ICD-10-CM | POA: Diagnosis not present

## 2022-04-10 DIAGNOSIS — I480 Paroxysmal atrial fibrillation: Secondary | ICD-10-CM | POA: Diagnosis present

## 2022-04-10 DIAGNOSIS — E785 Hyperlipidemia, unspecified: Secondary | ICD-10-CM | POA: Diagnosis not present

## 2022-04-10 MED ORDER — DILTIAZEM HCL ER COATED BEADS 240 MG PO CP24: 240.0000 mg | ORAL_CAPSULE | Freq: Every day | ORAL | 3 refills | Status: DC

## 2022-04-10 NOTE — Progress Notes (Signed)
Primary Care Physician: [name], MD Primary Cardiologist: [name] Primary Electrophysiologist: none Referring Physician: Dr Clemon Chambers Brian [name] is a 71 y.o. male with a history of HLD and atrial fibrillation who presents for follow up in the [facility].  The patient was initially diagnosed with atrial fibrillation in 2017 in the setting of viral duodenitis. Patient has a CHADS2VASC score of 1. He was in his usual state of health until he saw his PCP on 02/19/22 and was incidentally noted to be in afib with RVR. [name] was notified and patient was started on Xarelto 20 mg daily and diltiazem 180 mg daily. He was in SR on follow up. He had no awareness of his arrhythmia at that time.  On follow up today, patient was seen at the ED 04/03/22 for afib with RVR. His Kardia mobile had shown heart rates as high as 160 bpm and he had symptoms of weakness and lightheadedness. ECG on arrival to the ED showed SR with PACs. His diltiazem was increased. There were no specific triggers that the patient could identify. He has had two other brief episodes of afib on hs Kardia as well.   Today, he denies symptoms of palpitations, chest pain, shortness of breath, orthopnea, PND, lower extremity edema, dizziness, presyncope, syncope, snoring, daytime somnolence, bleeding, or neurologic sequela. The patient is tolerating medications without difficulties and is otherwise without complaint today.    Atrial Fibrillation Risk Factors:  he does not have symptoms or diagnosis of sleep apnea. he does not have a history of rheumatic fever. he does not have a history of alcohol use. The patient does have a history of early familial atrial fibrillation or other arrhythmias. Father had afib.  he has a BMI of Body mass index is 26 kg/m.Marland Kitchen Filed Weights   04/10/22 1522  Weight: 77.6 kg    Family History  Problem Relation Age of Onset   Cancer Mother    Cancer Father     Heart disease Father    Stroke Father    Cancer Maternal Grandmother    Stroke Maternal Grandfather    Coronary artery disease Other    Cancer Other    Stroke Other    Arrhythmia Other      Atrial Fibrillation Management history:  Previous antiarrhythmic drugs: none Previous cardioversions: none Previous ablations: none CHADS2VASC score: 1 Anticoagulation history: Xarelto    Past Medical History:  Diagnosis Date   Depression    Duodenitis 10/10/2015   PAF (paroxysmal atrial fibrillation) (HCC) Jan 2017   lone AF in setting of sepsis   Sepsis secondary to UTI (HCC) 10/10/2015   Past Surgical History:  Procedure Laterality Date   APPENDECTOMY     CARDIOVASCULAR STRESS TEST  01/22/2014   GXT - Ex 10 min (10.9 METs), HR 164 = 103% MPHR. No ST-T wave chagnes or Sx.  LOW RISK  --Duke TM Score 10    HOLTER MONITOR  10/2015   Mostly normal sinus rhythm. Recommended avoiding stimulants, energy drinks and decongestants. No signs of A. fib.Marland Kitchen PVCs noted in pairs, singles, bigeminy and trigeminy. No A. fib.   TRANSTHORACIC ECHOCARDIOGRAM  02/2014   Normal LV size and function.  EF 68%.  Mild MR.  Mild TR.  No pulmonary hypertension.  Blood normal.   VASECTOMY      Current Outpatient Medications  Medication Sig Dispense Refill   atorvastatin (LIPITOR) 10 MG tablet Take 1 tablet (10 mg total)  by mouth daily. 90 tablet 3   mirtazapine (REMERON) 7.5 MG tablet TAKE 1 TABLET BY MOUTH AT BEDTIME 30 tablet 3   Multiple Vitamin (MULTIVITAMIN) tablet Take 1 tablet by mouth daily.     PARoxetine (PAXIL) 30 MG tablet TAKE 1 TABLET BY MOUTH EVERY DAY 90 tablet 3   rivaroxaban (XARELTO) 20 MG TABS tablet Take 1 tablet (20 mg total) by mouth daily with supper. 30 tablet 5   diltiazem (CARDIZEM CD) 240 MG 24 hr capsule Take 1 capsule (240 mg total) by mouth daily. 30 capsule 3   No current facility-administered medications for this encounter.    Allergies  Allergen Reactions   Sulfa  Antibiotics     childhood    Social History   Socioeconomic History   Marital status: Married    Spouse name: Giovanne Nickolson   Number of children: 2   Years of education: College   Highest education level: Not on file  Occupational History   Occupation: retired Midwife  Tobacco Use   Smoking status: Never   Smokeless tobacco: Never   Tobacco comments:    Never smoke 02/22/22  Vaping Use   Vaping Use: Never used  Substance and Sexual Activity   Alcohol use: No    Alcohol/week: 0.0 standard drinks of alcohol   Drug use: No   Sexual activity: Yes    Partners: Female  Other Topics Concern   Not on file  Social History Narrative   He is married to [name] - who is a Teacher, early years/pre.      He is a retired Emergency planning/management officer, he has 2 school-aged grandchildren who are now 7 and 9.        He enjoys helping to take care of them, building furniture and volunteering with church and [employer].  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- this is still up and running       Exercise- silver sneakers   Left handed   Two story home   Social Determinants of Health   Financial Resource Strain: Low Risk  (03/21/2021)   Overall Financial Resource Strain (CARDIA)    Difficulty of Paying Living Expenses: Not hard at all  Food Insecurity: No Food Insecurity (03/21/2021)   Hunger Vital Sign    Worried About Running Out of Food in the Last Year: Never true    Ran Out of Food in the Last Year: Never true  Transportation Needs: No Transportation Needs (03/21/2021)   PRAPARE - Administrator, Civil Service (Medical): No    Lack of Transportation (Non-Medical): No  Physical Activity: Sufficiently Active (03/21/2021)   Exercise Vital Sign    Days of Exercise per Week: 7 days    Minutes of Exercise per Session: 30 min  Stress: No Stress  Concern Present (03/21/2021)   [employer] of Occupational Health - Occupational Stress Questionnaire    Feeling of Stress : Not at all  Social Connections: Socially Integrated (03/21/2021)   Social Connection and Isolation Panel [NHANES]    Frequency of Communication with Friends and Family: More than three times a week    Frequency of Social Gatherings with Friends and Family: More than three times a week    Attends Religious Services: More than 4 times per year  Active Member of Clubs or Organizations: Yes    Attends Banker Meetings: More than 4 times per year    Marital Status: Married  Catering manager Violence: Not At Risk (03/21/2021)   Humiliation, Afraid, Rape, and Kick questionnaire    Fear of Current or Ex-Partner: No    Emotionally Abused: No    Physically Abused: No    Sexually Abused: No     ROS- All systems are reviewed and negative except as per the HPI above.  Physical Exam: Vitals:   04/10/22 1522  BP: 114/74  Pulse: 85  Weight: 77.6 kg  Height: 5\' 8"  (1.727 m)    GEN- The patient is a well appearing male, alert and oriented x 3 today.   HEENT-head normocephalic, atraumatic, sclera clear, conjunctiva pink, hearing intact, trachea midline. Lungs- Clear to ausculation bilaterally, normal work of breathing Heart- Regular rate and rhythm, no murmurs, rubs or gallops  GI- soft, NT, ND, + BS Extremities- no clubbing, cyanosis, or edema MS- no significant deformity or atrophy Skin- no rash or lesion Psych- euthymic mood, full affect Neuro- strength and sensation are intact   Wt Readings from Last 3 Encounters:  04/10/22 77.6 kg  04/03/22 79.6 kg  02/22/22 79.6 kg    EKG today demonstrates  SR, PACs Vent. rate 85 BPM PR interval 180 ms QRS duration 82 ms QT/QTcB 336/399 ms   Echo 03/05/22 demonstrated   1. Left ventricular ejection fraction, by estimation, is 60 to 65%. The  left ventricle has normal function. The left ventricle has  no regional  wall motion abnormalities. Left ventricular diastolic parameters were  normal.   2. Right ventricular systolic function is normal. The right ventricular  size is normal. There is normal pulmonary artery systolic pressure. The  estimated right ventricular systolic pressure is 17.4 mmHg.   3. The mitral valve is grossly normal. Trivial mitral valve  regurgitation. No evidence of mitral stenosis.   4. The aortic valve is tricuspid. Aortic valve regurgitation is not  visualized. No aortic stenosis is present.   5. The inferior vena cava is normal in size with greater than 50%  respiratory variability, suggesting right atrial pressure of 3 mmHg.   Conclusion(s)/Recommendation(s): Normal biventricular function without evidence of hemodynamically significant valvular heart disease.   Epic records are reviewed at length today  CHA2DS2-VASc Score = 1  The patient's score is based upon: CHF History: 0 HTN History: 0 Diabetes History: 0 Stroke History: 0 Vascular Disease History: 0 Age Score: 1 Gender Score: 0       ASSESSMENT AND PLAN: 1. Paroxysmal Atrial Fibrillation (ICD10:  I48.0) The patient's CHA2DS2-VASc score is 1, indicating a 0.6% annual risk of stroke.   Patient having frequent episodes of afib with rapid rates. We discussed rhythm control options today including AAD and ablation. He is agreeable to referral to EP for ablation evaluation.  Recall, patient wants to remain on anticoagulation given his family history of CVA.  Continue Xarelto 20 mg daily Continue diltiazem 240 mg daily   Follow up with EP to discuss possible afib ablation.    03/07/22 PA-C [facility] [address] [place], [place] [id number] 04/10/2022 3:51 PM

## 2022-04-30 ENCOUNTER — Encounter: Payer: Self-pay | Admitting: *Deleted

## 2022-04-30 ENCOUNTER — Encounter (HOSPITAL_COMMUNITY): Payer: Self-pay

## 2022-04-30 ENCOUNTER — Telehealth (HOSPITAL_COMMUNITY): Payer: Self-pay | Admitting: *Deleted

## 2022-04-30 ENCOUNTER — Other Ambulatory Visit (HOSPITAL_BASED_OUTPATIENT_CLINIC_OR_DEPARTMENT_OTHER): Payer: Self-pay

## 2022-04-30 ENCOUNTER — Ambulatory Visit (INDEPENDENT_AMBULATORY_CARE_PROVIDER_SITE_OTHER): Payer: Medicare Other | Admitting: Cardiology

## 2022-04-30 ENCOUNTER — Encounter: Payer: Self-pay | Admitting: Cardiology

## 2022-04-30 VITALS — BP 122/74 | HR 102 | Ht 68.0 in | Wt 174.4 lb

## 2022-04-30 DIAGNOSIS — I480 Paroxysmal atrial fibrillation: Secondary | ICD-10-CM | POA: Diagnosis not present

## 2022-04-30 DIAGNOSIS — Z01812 Encounter for preprocedural laboratory examination: Secondary | ICD-10-CM | POA: Diagnosis not present

## 2022-04-30 LAB — CBC
Hematocrit: 44.7 % (ref 37.5–51.0)
Hemoglobin: 15.3 g/dL (ref 13.0–17.7)
MCH: 29.9 pg (ref 26.6–33.0)
MCHC: 34.2 g/dL (ref 31.5–35.7)
MCV: 87 fL (ref 79–97)
Platelets: 198 10*3/uL (ref 150–450)
RBC: 5.12 x10E6/uL (ref 4.14–5.80)
RDW: 14.9 % (ref 11.6–15.4)
WBC: 6.9 10*3/uL (ref 3.4–10.8)

## 2022-04-30 LAB — BASIC METABOLIC PANEL
BUN/Creatinine Ratio: 18 (ref 10–24)
BUN: 16 mg/dL (ref 8–27)
CO2: 27 mmol/L (ref 20–29)
Calcium: 10.5 mg/dL — ABNORMAL HIGH (ref 8.6–10.2)
Chloride: 105 mmol/L (ref 96–106)
Creatinine, Ser: 0.89 mg/dL (ref 0.76–1.27)
Glucose: 110 mg/dL — ABNORMAL HIGH (ref 70–99)
Potassium: 4.3 mmol/L (ref 3.5–5.2)
Sodium: 141 mmol/L (ref 134–144)
eGFR: 93 mL/min/{1.73_m2} (ref >59)

## 2022-04-30 MED ORDER — METOPROLOL TARTRATE 50 MG PO TABS: ORAL_TABLET | ORAL | 0 refills | Status: DC

## 2022-04-30 NOTE — Progress Notes (Signed)
 Electrophysiology Office Note   Date:  04/30/2022   ID:  Brian [name], DOB 12/15/1950, MRN [id number]  PCP:  Copland, Jessica C, MD  Cardiologist:  Harding Primary Electrophysiologist:  Mandie Crabbe Martin Elgie Maziarz, MD    Chief Complaint: AF   History of Present Illness: Brian [name] is a 71 y.o. male who is being seen today for the evaluation of AF at the request of Fenton, Clint R, PA. Presenting today for electrophysiology evaluation.  He has a history seen for hyperlipidemia and atrial fibrillation.  He was diagnosed with atrial fibrillation in 2017.  He saw his primary physician 02/19/2022 and was noted to be in atrial fibrillation.  He was started on Xarelto and diltiazem.  He was in normal rhythm and follow-up.  He was seen in the ER 04/03/2022 with rapid atrial fibrillation.  Cardia mobile showed heart rates as high as 160 bpm.  He had weakness and lightheadedness.  When he presented to the emergency room he was in sinus rhythm.  Today, he denies symptoms of palpitations, chest pain, shortness of breath, orthopnea, PND, lower extremity edema, claudication, dizziness, presyncope, syncope, bleeding, or neurologic sequela. The patient is tolerating medications without difficulties.    Past Medical History:  Diagnosis Date   Depression    Duodenitis 10/10/2015   PAF (paroxysmal atrial fibrillation) (HCC) Jan 2017   lone AF in setting of sepsis   Sepsis secondary to UTI (HCC) 10/10/2015   Past Surgical History:  Procedure Laterality Date   APPENDECTOMY     CARDIOVASCULAR STRESS TEST  01/22/2014   GXT - Ex 10 min (10.9 METs), HR 164 = 103% MPHR. No ST-T wave chagnes or Sx.  LOW RISK  --[facility] TM Score 10    HOLTER MONITOR  10/2015   Mostly normal sinus rhythm. Recommended avoiding stimulants, energy drinks and decongestants. No signs of A. fib.. PVCs noted in pairs, singles, bigeminy and trigeminy. No A. fib.   TRANSTHORACIC ECHOCARDIOGRAM  02/2014   Normal LV size and function.  EF 68%.   Mild MR.  Mild TR.  No pulmonary hypertension.  Blood normal.   VASECTOMY       Current Outpatient Medications  Medication Sig Dispense Refill   atorvastatin (LIPITOR) 10 MG tablet Take 1 tablet (10 mg total) by mouth daily. 90 tablet 3   diltiazem (CARDIZEM CD) 240 MG 24 hr capsule Take 1 capsule (240 mg total) by mouth daily. 30 capsule 3   mirtazapine (REMERON) 7.5 MG tablet TAKE 1 TABLET BY MOUTH AT BEDTIME 30 tablet 3   Multiple Vitamin (MULTIVITAMIN) tablet Take 1 tablet by mouth daily.     PARoxetine (PAXIL) 30 MG tablet TAKE 1 TABLET BY MOUTH EVERY DAY 90 tablet 3   rivaroxaban (XARELTO) 20 MG TABS tablet Take 1 tablet (20 mg total) by mouth daily with supper. 30 tablet 5   metoprolol tartrate (LOPRESSOR) 50 MG tablet Take 1 tablet two hours prior to CT 1 tablet 0   No current facility-administered medications for this visit.    Allergies:   Sulfa antibiotics   Social History:  The patient  reports that he has never smoked. He has never used smokeless tobacco. He reports that he does not drink alcohol and does not use drugs.   Family History:  The patient's family history includes Arrhythmia in an other family member; Cancer in his father, maternal grandmother, mother, and another family member; Coronary artery disease in an other family member; Heart disease in his father; Stroke in   his father, maternal grandfather, and another family member.    ROS:  Please see the history of present illness.   Otherwise, review of systems is positive for none.   All other systems are reviewed and negative.    PHYSICAL EXAM: VS:  BP 122/74   Pulse (!) 102   Ht 5\' 8"  (1.727 m)   Wt 174 lb 6.4 oz (79.1 kg)   SpO2 95%   BMI 26.52 kg/m  , BMI Body mass index is 26.52 kg/m. GEN: Well nourished, well developed, in no acute distress  HEENT: normal  Neck: no JVD, carotid bruits, or masses Cardiac: RRR; no murmurs, rubs, or gallops,no edema  Respiratory:  clear to auscultation bilaterally,  normal work of breathing GI: soft, nontender, nondistended, + BS MS: no deformity or atrophy  Skin: warm and dry Neuro:  Strength and sensation are intact Psych: euthymic mood, full affect  EKG:  EKG is not ordered today. Personal review of the ekg ordered 7/25/223 shows sinus rhythm  Recent Labs: 02/19/2022: ALT 24 04/03/2022: BUN 13; Creatinine, Ser 0.98; Hemoglobin 15.8; Magnesium 1.9; Platelets 192; Potassium 4.1; Sodium 137; TSH 2.136    Lipid Panel     Component Value Date/Time   CHOL 151 12/11/2021 1056   TRIG 89.0 12/11/2021 1056   HDL 60.40 12/11/2021 1056   CHOLHDL 3 12/11/2021 1056   VLDL 17.8 12/11/2021 1056   LDLCALC 73 12/11/2021 1056     Wt Readings from Last 3 Encounters:  04/30/22 174 lb 6.4 oz (79.1 kg)  04/10/22 171 lb (77.6 kg)  04/03/22 175 lb 7.8 oz (79.6 kg)      Other studies Reviewed: Additional studies/ records that were reviewed today include: TTE 03/05/22  Review of the above records today demonstrates:  1. Left ventricular ejection fraction, by estimation, is 60 to 65%. The  left ventricle has normal function. The left ventricle has no regional  wall motion abnormalities. Left ventricular diastolic parameters were  normal.   2. Right ventricular systolic function is normal. The right ventricular  size is normal. There is normal pulmonary artery systolic pressure. The  estimated right ventricular systolic pressure is 17.4 mmHg.   3. The mitral valve is grossly normal. Trivial mitral valve  regurgitation. No evidence of mitral stenosis.   4. The aortic valve is tricuspid. Aortic valve regurgitation is not  visualized. No aortic stenosis is present.   5. The inferior vena cava is normal in size with greater than 50%  respiratory variability, suggesting right atrial pressure of 3 mmHg.    ASSESSMENT AND PLAN:  1.  Paroxysmal atrial fibrillation: CHA2DS2-VASc of 1.  He has been having more frequent episodes of atrial fibrillation, making him  feel quite poorly with weakness and fatigue.  He would prefer a rhythm control strategy.  At this point, he would prefer to avoid antiarrhythmic medications.  Due to that, we Yehia Mcbain plan for ablation.  Risk, benefits, and alternatives to EP study and radiofrequency ablation for afib were also discussed in detail today. These risks include but are not limited to stroke, bleeding, vascular damage, tamponade, perforation, damage to the esophagus, lungs, and other structures, pulmonary vein stenosis, worsening renal function, and death. The patient understands these risk and wishes to proceed.  We Benaiah Behan therefore proceed with catheter ablation at the next available time.  Carto, ICE, anesthesia are requested for the procedure.  Elexa Kivi also obtain CT PV protocol prior to the procedure to exclude LAA thrombus and further evaluate atrial anatomy.  Current medicines are reviewed at length with the patient today.   The patient does not have concerns regarding his medicines.  The following changes were made today:  none  Labs/ tests ordered today include:  Orders Placed This Encounter  Procedures   CT CARDIAC MORPH/PULM VEIN W/CM&W/O CA SCORE   Basic metabolic panel   CBC     Disposition:   FU with Kingdavid Leinbach 3 months  Signed, Carolynne Schuchard Jorja Loa, MD  04/30/2022 10:47 AM     [facility] [address] [place] [address] [phone] (office) [phone] (fax)

## 2022-04-30 NOTE — Patient Instructions (Signed)
Medication Instructions:  Your physician recommends that you continue on your current medications as directed. Please refer to the Current Medication list given to you today.  *If you need a refill on your cardiac medications before your next appointment, please call your pharmacy*   Lab Work: Pre procedure labs today --  BMP & CBC  If you have labs (blood work) drawn today and your tests are completely normal, you will receive your results only by: MyChart Message (if you have MyChart) OR A paper copy in the mail If you have any lab test that is abnormal or we need to change your treatment, we will call you to review the results.   Testing/Procedures: Your physician has requested that you have cardiac CT within 7 days PRIOR to your ablation. Cardiac computed tomography (CT) is a painless test that uses an x-ray machine to take clear, detailed pictures of your heart.  Please follow instruction below located under "other instructions". You will get a call from our office to schedule the date for this test.  Your physician has recommended that you have an ablation. Catheter ablation is a medical procedure used to treat some cardiac arrhythmias (irregular heartbeats). During catheter ablation, a long, thin, flexible tube is put into a blood vessel in your groin (upper thigh), or neck. This tube is called an ablation catheter. It is then guided to your heart through the blood vessel. Radio frequency waves destroy small areas of heart tissue where abnormal heartbeats may cause an arrhythmia to start. Please follow instruction letter given to you today.   Follow-Up: At [facility], you and your health needs are our priority.  As part of our continuing mission to provide you with exceptional heart care, we have created designated Provider Care Teams.  These Care Teams include your primary Cardiologist (physician) and Advanced Practice Providers (APPs -  Physician Assistants and Nurse  Practitioners) who all work together to provide you with the care you need, when you need it.  Your next appointment:   1 month(s) after your ablation  The format for your next appointment:   In Person  Provider:   AFib clinic   Thank you for choosing [facility]!!   Wissam Resor, RN [phone]    Other Instructions   Cardiac Ablation Cardiac ablation is a procedure to destroy (ablate) some heart tissue that is sending bad signals. These bad signals cause problems in heart rhythm. The heart has many areas that make these signals. If there are problems in these areas, they can make the heart beat in a way that is not normal. Destroying some tissues can help make the heart rhythm normal. Tell your doctor about: Any allergies you have. All medicines you are taking. These include vitamins, herbs, eye drops, creams, and over-the-counter medicines. Any problems you or family members have had with medicines that make you fall asleep (anesthetics). Any blood disorders you have. Any surgeries you have had. Any medical conditions you have, such as kidney failure. Whether you are pregnant or may be pregnant. What are the risks? This is a safe procedure. But problems may occur, including: Infection. Bruising and bleeding. Bleeding into the chest. Stroke or blood clots. Damage to nearby areas of your body. Allergies to medicines or dyes. The need for a pacemaker if the normal system is damaged. Failure of the procedure to treat the problem. What happens before the procedure? Medicines Ask your doctor about: Changing or stopping your normal medicines. This is important. Taking   aspirin and ibuprofen. Do not take these medicines unless your doctor tells you to take them. Taking other medicines, vitamins, herbs, and supplements. General instructions Follow instructions from your doctor about what you cannot eat or drink. Plan to have someone take you home from the hospital  or clinic. If you will be going home right after the procedure, plan to have someone with you for 24 hours. Ask your doctor what steps will be taken to prevent infection. What happens during the procedure?  An IV tube will be put into one of your veins. You will be given a medicine to help you relax. The skin on your neck or groin will be numbed. A cut (incision) will be made in your neck or groin. A needle will be put through your cut and into a large vein. A tube (catheter) will be put into the needle. The tube will be moved to your heart. Dye may be put through the tube. This helps your doctor see your heart. Small devices (electrodes) on the tube will send out signals. A type of energy will be used to destroy some heart tissue. The tube will be taken out. Pressure will be held on your cut. This helps stop bleeding. A bandage will be put over your cut. The exact procedure may vary among doctors and hospitals. What happens after the procedure? You will be watched until you leave the hospital or clinic. This includes checking your heart rate, breathing rate, oxygen, and blood pressure. Your cut will be watched for bleeding. You will need to lie still for a few hours. Do not drive for 24 hours or as long as your doctor tells you. Summary Cardiac ablation is a procedure to destroy some heart tissue. This is done to treat heart rhythm problems. Tell your doctor about any medical conditions you may have. Tell him or her about all medicines you are taking to treat them. This is a safe procedure. But problems may occur. These include infection, bruising, bleeding, and damage to nearby areas of your body. Follow what your doctor tells you about food and drink. You may also be told to change or stop some of your medicines. After the procedure, do not drive for 24 hours or as long as your doctor tells you. This information is not intended to replace advice given to you by your health care  provider. Make sure you discuss any questions you have with your health care provider. Document Revised: 11/24/2021 Document Reviewed: 08/06/2019 Elsevier Patient Education  2023 [employer]           

## 2022-04-30 NOTE — H&P (View-Only) (Signed)
Electrophysiology Office Note   Date:  04/30/2022   ID:  Brian [name], DOB Aug 27, 1951, MRN [id number]  PCP:  Darreld Mclean, MD  Cardiologist:  Ellyn Hack Primary Electrophysiologist:  Curren Mohrmann Meredith Leeds, MD    Chief Complaint: AF   History of Present Illness: Brian [name] is a 71 y.o. male who is being seen today for the evaluation of AF at the request of Fenton, Clint R, PA. Presenting today for electrophysiology evaluation.  He has a history seen for hyperlipidemia and atrial fibrillation.  He was diagnosed with atrial fibrillation in 2017.  He saw his primary physician 02/19/2022 and was noted to be in atrial fibrillation.  He was started on Xarelto and diltiazem.  He was in normal rhythm and follow-up.  He was seen in the ER 04/03/2022 with rapid atrial fibrillation.  Cardia mobile showed heart rates as high as 160 bpm.  He had weakness and lightheadedness.  When he presented to the emergency room he was in sinus rhythm.  Today, he denies symptoms of palpitations, chest pain, shortness of breath, orthopnea, PND, lower extremity edema, claudication, dizziness, presyncope, syncope, bleeding, or neurologic sequela. The patient is tolerating medications without difficulties.    Past Medical History:  Diagnosis Date   Depression    Duodenitis 10/10/2015   PAF (paroxysmal atrial fibrillation) ([place]) Jan 2017   lone AF in setting of sepsis   Sepsis secondary to UTI ([place]) 10/10/2015   Past Surgical History:  Procedure Laterality Date   APPENDECTOMY     CARDIOVASCULAR STRESS TEST  01/22/2014   GXT - Ex 10 min (10.9 METs), HR 164 = 103% MPHR. No ST-T wave chagnes or Sx.  LOW RISK  --[facility] TM Score 10    HOLTER MONITOR  10/2015   Mostly normal sinus rhythm. Recommended avoiding stimulants, energy drinks and decongestants. No signs of A. fib.Marland Kitchen PVCs noted in pairs, singles, bigeminy and trigeminy. No A. fib.   TRANSTHORACIC ECHOCARDIOGRAM  02/2014   Normal LV size and function.  EF 68%.   Mild MR.  Mild TR.  No pulmonary hypertension.  Blood normal.   VASECTOMY       Current Outpatient Medications  Medication Sig Dispense Refill   atorvastatin (LIPITOR) 10 MG tablet Take 1 tablet (10 mg total) by mouth daily. 90 tablet 3   diltiazem (CARDIZEM CD) 240 MG 24 hr capsule Take 1 capsule (240 mg total) by mouth daily. 30 capsule 3   mirtazapine (REMERON) 7.5 MG tablet TAKE 1 TABLET BY MOUTH AT BEDTIME 30 tablet 3   Multiple Vitamin (MULTIVITAMIN) tablet Take 1 tablet by mouth daily.     PARoxetine (PAXIL) 30 MG tablet TAKE 1 TABLET BY MOUTH EVERY DAY 90 tablet 3   rivaroxaban (XARELTO) 20 MG TABS tablet Take 1 tablet (20 mg total) by mouth daily with supper. 30 tablet 5   metoprolol tartrate (LOPRESSOR) 50 MG tablet Take 1 tablet two hours prior to CT 1 tablet 0   No current facility-administered medications for this visit.    Allergies:   Sulfa antibiotics   Social History:  The patient  reports that he has never smoked. He has never used smokeless tobacco. He reports that he does not drink alcohol and does not use drugs.   Family History:  The patient's family history includes Arrhythmia in an other family member; Cancer in his father, maternal grandmother, mother, and another family member; Coronary artery disease in an other family member; Heart disease in his father; Stroke in  his father, maternal grandfather, and another family member.    ROS:  Please see the history of present illness.   Otherwise, review of systems is positive for none.   All other systems are reviewed and negative.    PHYSICAL EXAM: VS:  BP 122/74   Pulse (!) 102   Ht 5\' 8"  (1.727 m)   Wt 174 lb 6.4 oz (79.1 kg)   SpO2 95%   BMI 26.52 kg/m  , BMI Body mass index is 26.52 kg/m. GEN: Well nourished, well developed, in no acute distress  HEENT: normal  Neck: no JVD, carotid bruits, or masses Cardiac: RRR; no murmurs, rubs, or gallops,no edema  Respiratory:  clear to auscultation bilaterally,  normal work of breathing GI: soft, nontender, nondistended, + BS MS: no deformity or atrophy  Skin: warm and dry Neuro:  Strength and sensation are intact Psych: euthymic mood, full affect  EKG:  EKG is not ordered today. Personal review of the ekg ordered 7/25/223 shows sinus rhythm  Recent Labs: 02/19/2022: ALT 24 04/03/2022: BUN 13; Creatinine, Ser 0.98; Hemoglobin 15.8; Magnesium 1.9; Platelets 192; Potassium 4.1; Sodium 137; TSH 2.136    Lipid Panel     Component Value Date/Time   CHOL 151 12/11/2021 1056   TRIG 89.0 12/11/2021 1056   HDL 60.40 12/11/2021 1056   CHOLHDL 3 12/11/2021 1056   VLDL 17.8 12/11/2021 1056   LDLCALC 73 12/11/2021 1056     Wt Readings from Last 3 Encounters:  04/30/22 174 lb 6.4 oz (79.1 kg)  04/10/22 171 lb (77.6 kg)  04/03/22 175 lb 7.8 oz (79.6 kg)      Other studies Reviewed: Additional studies/ records that were reviewed today include: TTE 03/05/22  Review of the above records today demonstrates:  1. Left ventricular ejection fraction, by estimation, is 60 to 65%. The  left ventricle has normal function. The left ventricle has no regional  wall motion abnormalities. Left ventricular diastolic parameters were  normal.   2. Right ventricular systolic function is normal. The right ventricular  size is normal. There is normal pulmonary artery systolic pressure. The  estimated right ventricular systolic pressure is 17.4 mmHg.   3. The mitral valve is grossly normal. Trivial mitral valve  regurgitation. No evidence of mitral stenosis.   4. The aortic valve is tricuspid. Aortic valve regurgitation is not  visualized. No aortic stenosis is present.   5. The inferior vena cava is normal in size with greater than 50%  respiratory variability, suggesting right atrial pressure of 3 mmHg.    ASSESSMENT AND PLAN:  1.  Paroxysmal atrial fibrillation: CHA2DS2-VASc of 1.  He has been having more frequent episodes of atrial fibrillation, making him  feel quite poorly with weakness and fatigue.  He would prefer a rhythm control strategy.  At this point, he would prefer to avoid antiarrhythmic medications.  Due to that, we Jacey Pelc plan for ablation.  Risk, benefits, and alternatives to EP study and radiofrequency ablation for afib were also discussed in detail today. These risks include but are not limited to stroke, bleeding, vascular damage, tamponade, perforation, damage to the esophagus, lungs, and other structures, pulmonary vein stenosis, worsening renal function, and death. The patient understands these risk and wishes to proceed.  We Tuck Dulworth therefore proceed with catheter ablation at the next available time.  Carto, ICE, anesthesia are requested for the procedure.  Kevonna Nolte also obtain CT PV protocol prior to the procedure to exclude LAA thrombus and further evaluate atrial anatomy.  Current medicines are reviewed at length with the patient today.   The patient does not have concerns regarding his medicines.  The following changes were made today:  none  Labs/ tests ordered today include:  Orders Placed This Encounter  Procedures   CT CARDIAC MORPH/PULM VEIN W/CM&W/O CA SCORE   Basic metabolic panel   CBC     Disposition:   FU with Lyndsy Gilberto 3 months  Signed, Kenyata Guess Jorja Loa, MD  04/30/2022 10:47 AM     [facility] [address] [place] [address] [phone] (office) [phone] (fax)

## 2022-04-30 NOTE — Telephone Encounter (Signed)
Reaching out to patient to offer assistance regarding upcoming cardiac imaging study; pt verbalizes understanding of appt date/time, parking situation and where to check in, pre-test NPO status and medications ordered, and verified current allergies; name and call back number provided for further questions should they arise  Chantel Teti RN Navigator Cardiac Imaging [facility] [phone] office [phone] cell  Patient to take 50mg metoprolol tartrate two hours prior to his cardiac CT scan. He is aware to arrive at 1pm. 

## 2022-05-01 ENCOUNTER — Ambulatory Visit (HOSPITAL_COMMUNITY)
Admission: RE | Admit: 2022-05-01 | Discharge: 2022-05-01 | Disposition: A | Discharge status: Home / Self Care | Payer: Medicare Other | Source: Ambulatory Visit | Attending: Cardiology | Admitting: Cardiology

## 2022-05-01 DIAGNOSIS — I480 Paroxysmal atrial fibrillation: Secondary | ICD-10-CM | POA: Insufficient documentation

## 2022-05-01 MED ORDER — SODIUM CHLORIDE 0.9 % IV BOLUS
250.0000 mL | Freq: Once | INTRAVENOUS | Status: AC
Start: 2022-05-01 — End: 2022-05-01
  Administered 2022-05-01: 250 mL via INTRAVENOUS

## 2022-05-01 MED ORDER — IOHEXOL 350 MG/ML SOLN
100.0000 mL | Freq: Once | INTRAVENOUS | Status: AC | PRN
  Administered 2022-05-01: 100 mL via INTRAVENOUS

## 2022-05-07 NOTE — Anesthesia Preprocedure Evaluation (Signed)
Anesthesia Evaluation  Patient identified by MRN, date of birth, ID band Patient awake    Reviewed: Allergy & Precautions, NPO status , Patient's Chart, lab work & pertinent test results  Airway Mallampati: III  TM Distance: >3 FB Neck ROM: Full    Dental no notable dental hx. (+) Dental Advisory Given, Teeth Intact   Pulmonary neg pulmonary ROS,    Pulmonary exam normal breath sounds clear to auscultation       Cardiovascular + dysrhythmias Atrial Fibrillation  Rhythm:Regular Rate:Normal  Echo 02/2022 1. Left ventricular ejection fraction, by estimation, is 60 to 65%. The left ventricle has normal function. The left ventricle has no regional wall motion abnormalities. Left ventricular diastolic parameters were normal.  2. Right ventricular systolic function is normal. The right ventricular size is normal. There is normal pulmonary artery systolic pressure. The estimated right ventricular systolic pressure is 17.4 mmHg.  3. The mitral valve is grossly normal. Trivial mitral valve  regurgitation. No evidence of mitral stenosis.  4. The aortic valve is tricuspid. Aortic valve regurgitation is not visualized. No aortic stenosis is present.  5. The inferior vena cava is normal in size with greater than 50% respiratory variability, suggesting right atrial pressure of 3 mmHg.   Neuro/Psych PSYCHIATRIC DISORDERS Depression negative neurological ROS     GI/Hepatic negative GI ROS, Neg liver ROS,   Endo/Other  negative endocrine ROS  Renal/GU negative Renal ROS     Musculoskeletal negative musculoskeletal ROS (+)   Abdominal   Peds  Hematology negative hematology ROS (+)   Anesthesia Other Findings   Reproductive/Obstetrics                            Anesthesia Physical Anesthesia Plan  ASA: 3  Anesthesia Plan: General   Post-op Pain Management: Minimal or no pain anticipated   Induction:  Intravenous  PONV Risk Score and Plan: 2 and Ondansetron, Dexamethasone, Treatment may vary due to age or medical condition and Midazolam  Airway Management Planned: Oral ETT  Additional Equipment:   Intra-op Plan:   Post-operative Plan: Extubation in OR  Informed Consent: I have reviewed the patients History and Physical, chart, labs and discussed the procedure including the risks, benefits and alternatives for the proposed anesthesia with the patient or authorized representative who has indicated his/her understanding and acceptance.     Dental advisory given  Plan Discussed with: CRNA  Anesthesia Plan Comments:        Anesthesia Quick Evaluation

## 2022-05-07 NOTE — Pre-Procedure Instructions (Signed)
Instructed patient on the following items: Arrival time 0530 Nothing to eat or drink after midnight No meds AM of procedure Responsible person to drive you home and stay with you for 24 hrs  Have you missed any doses of anti-coagulant Xarelto- hasn't missed any doses    

## 2022-05-08 ENCOUNTER — Telehealth: Payer: Self-pay | Admitting: Cardiology

## 2022-05-08 ENCOUNTER — Ambulatory Visit (HOSPITAL_COMMUNITY): Payer: Medicare Other | Admitting: Anesthesiology

## 2022-05-08 ENCOUNTER — Ambulatory Visit (HOSPITAL_COMMUNITY)
Admission: RE | Admit: 2022-05-08 | Discharge: 2022-05-08 | Disposition: A | Discharge status: Home / Self Care | Payer: Medicare Other | Source: Ambulatory Visit | Attending: Cardiology | Admitting: Cardiology

## 2022-05-08 ENCOUNTER — Encounter (HOSPITAL_COMMUNITY)
Admission: RE | Disposition: A | Discharge status: Home / Self Care | Payer: Self-pay | Source: Ambulatory Visit | Attending: Cardiology

## 2022-05-08 ENCOUNTER — Encounter (HOSPITAL_COMMUNITY): Payer: Self-pay | Admitting: Cardiology

## 2022-05-08 ENCOUNTER — Other Ambulatory Visit: Payer: Self-pay

## 2022-05-08 ENCOUNTER — Ambulatory Visit (HOSPITAL_BASED_OUTPATIENT_CLINIC_OR_DEPARTMENT_OTHER): Payer: Medicare Other | Admitting: Anesthesiology

## 2022-05-08 DIAGNOSIS — E785 Hyperlipidemia, unspecified: Secondary | ICD-10-CM | POA: Insufficient documentation

## 2022-05-08 DIAGNOSIS — Z79899 Other long term (current) drug therapy: Secondary | ICD-10-CM | POA: Diagnosis not present

## 2022-05-08 DIAGNOSIS — I4891 Unspecified atrial fibrillation: Secondary | ICD-10-CM | POA: Diagnosis not present

## 2022-05-08 DIAGNOSIS — Z7901 Long term (current) use of anticoagulants: Secondary | ICD-10-CM | POA: Insufficient documentation

## 2022-05-08 DIAGNOSIS — I480 Paroxysmal atrial fibrillation: Secondary | ICD-10-CM

## 2022-05-08 HISTORY — PX: ATRIAL FIBRILLATION ABLATION: EP1191

## 2022-05-08 LAB — POCT ACTIVATED CLOTTING TIME
Activated Clotting Time: 305 seconds
Activated Clotting Time: 335 seconds

## 2022-05-08 SURGERY — ATRIAL FIBRILLATION ABLATION
Anesthesia: General

## 2022-05-08 MED ORDER — FENTANYL CITRATE (PF) 100 MCG/2ML IJ SOLN
INTRAMUSCULAR | Status: AC
  Filled 2022-05-08: qty 2

## 2022-05-08 MED ORDER — HEPARIN (PORCINE) IN NACL 1000-0.9 UT/500ML-% IV SOLN
INTRAVENOUS | Status: AC
  Filled 2022-05-08: qty 500

## 2022-05-08 MED ORDER — DOBUTAMINE INFUSION FOR EP/ECHO/NUC (1000 MCG/ML)
INTRAVENOUS | Status: DC | PRN
  Administered 2022-05-08: 10 ug/kg/min via INTRAVENOUS

## 2022-05-08 MED ORDER — LIDOCAINE 2% (20 MG/ML) 5 ML SYRINGE
INTRAMUSCULAR | Status: DC | PRN
  Administered 2022-05-08: 60 mg via INTRAVENOUS

## 2022-05-08 MED ORDER — HEPARIN SODIUM (PORCINE) 1000 UNIT/ML IJ SOLN
INTRAMUSCULAR | Status: DC | PRN
  Administered 2022-05-08: 14000 [IU] via INTRAVENOUS
  Administered 2022-05-08: 1000 [IU] via INTRAVENOUS
  Administered 2022-05-08: 3000 [IU] via INTRAVENOUS

## 2022-05-08 MED ORDER — PHENYLEPHRINE HCL-NACL 20-0.9 MG/250ML-% IV SOLN
INTRAVENOUS | Status: DC | PRN
  Administered 2022-05-08: 50 ug/min via INTRAVENOUS

## 2022-05-08 MED ORDER — SUGAMMADEX SODIUM 200 MG/2ML IV SOLN
INTRAVENOUS | Status: DC | PRN
  Administered 2022-05-08: 200 mg via INTRAVENOUS

## 2022-05-08 MED ORDER — SODIUM CHLORIDE 0.9 % IV SOLN: INTRAVENOUS | Status: DC

## 2022-05-08 MED ORDER — DEXAMETHASONE SODIUM PHOSPHATE 10 MG/ML IJ SOLN
INTRAMUSCULAR | Status: DC | PRN
  Administered 2022-05-08: 8 mg via INTRAVENOUS

## 2022-05-08 MED ORDER — HEPARIN SODIUM (PORCINE) 1000 UNIT/ML IJ SOLN
INTRAMUSCULAR | Status: AC
  Filled 2022-05-08: qty 10

## 2022-05-08 MED ORDER — MIDAZOLAM HCL 2 MG/2ML IJ SOLN
INTRAMUSCULAR | Status: DC | PRN
  Administered 2022-05-08: 2 mg via INTRAVENOUS

## 2022-05-08 MED ORDER — MIDAZOLAM HCL 2 MG/2ML IJ SOLN
INTRAMUSCULAR | Status: AC
  Filled 2022-05-08: qty 2

## 2022-05-08 MED ORDER — ROCURONIUM BROMIDE 10 MG/ML (PF) SYRINGE
PREFILLED_SYRINGE | INTRAVENOUS | Status: DC | PRN
  Administered 2022-05-08: 60 mg via INTRAVENOUS

## 2022-05-08 MED ORDER — SODIUM CHLORIDE 0.9 % IV SOLN: 250.0000 mL | INTRAVENOUS | Status: DC | PRN

## 2022-05-08 MED ORDER — DOBUTAMINE INFUSION FOR EP/ECHO/NUC (1000 MCG/ML)
INTRAVENOUS | Status: AC
  Filled 2022-05-08: qty 250

## 2022-05-08 MED ORDER — HEPARIN (PORCINE) IN NACL 1000-0.9 UT/500ML-% IV SOLN
INTRAVENOUS | Status: DC | PRN
  Administered 2022-05-08 (×4): 500 mL

## 2022-05-08 MED ORDER — ONDANSETRON HCL 4 MG/2ML IJ SOLN
INTRAMUSCULAR | Status: DC | PRN
  Administered 2022-05-08: 4 mg via INTRAVENOUS

## 2022-05-08 MED ORDER — PHENYLEPHRINE 80 MCG/ML (10ML) SYRINGE FOR IV PUSH (FOR BLOOD PRESSURE SUPPORT)
PREFILLED_SYRINGE | INTRAVENOUS | Status: DC | PRN
  Administered 2022-05-08: 160 ug via INTRAVENOUS
  Administered 2022-05-08: 200 ug via INTRAVENOUS
  Administered 2022-05-08: 80 ug via INTRAVENOUS

## 2022-05-08 MED ORDER — PROPOFOL 10 MG/ML IV BOLUS
INTRAVENOUS | Status: DC | PRN
  Administered 2022-05-08: 150 mg via INTRAVENOUS

## 2022-05-08 MED ORDER — EPHEDRINE SULFATE (PRESSORS) 50 MG/ML IJ SOLN
INTRAMUSCULAR | Status: DC | PRN
  Administered 2022-05-08 (×2): 5 mg via INTRAVENOUS
  Administered 2022-05-08: 2.5 mg via INTRAVENOUS
  Administered 2022-05-08: 5 mg via INTRAVENOUS

## 2022-05-08 MED ORDER — SODIUM CHLORIDE 0.9% FLUSH: 3.0000 mL | INTRAVENOUS | Status: DC | PRN

## 2022-05-08 MED ORDER — SODIUM CHLORIDE 0.9% FLUSH: 3.0000 mL | Freq: Two times a day (BID) | INTRAVENOUS | Status: DC

## 2022-05-08 MED ORDER — FENTANYL CITRATE (PF) 250 MCG/5ML IJ SOLN
INTRAMUSCULAR | Status: DC | PRN
Start: 2022-05-08 — End: 2022-05-08
  Administered 2022-05-08: 100 ug via INTRAVENOUS

## 2022-05-08 MED ORDER — PROTAMINE SULFATE 10 MG/ML IV SOLN
INTRAVENOUS | Status: DC | PRN
  Administered 2022-05-08 (×2): 40 mg via INTRAVENOUS

## 2022-05-08 MED ORDER — ONDANSETRON HCL 4 MG/2ML IJ SOLN: 4.0000 mg | Freq: Four times a day (QID) | INTRAMUSCULAR | Status: DC | PRN

## 2022-05-08 MED ORDER — ACETAMINOPHEN 325 MG PO TABS: 650.0000 mg | ORAL_TABLET | ORAL | Status: DC | PRN

## 2022-05-08 SURGICAL SUPPLY — 19 items
CATH 8FR REPROCESSED SOUNDSTAR (CATHETERS) ×1 IMPLANT
CATH 8FR SOUNDSTAR REPROCESSED (CATHETERS) IMPLANT
CATH ABLAT QDOT MICRO BI TC DF (CATHETERS) IMPLANT
CATH OCTARAY 1.5 F (CATHETERS) IMPLANT
CATH S-M CIRCA TEMP PROBE (CATHETERS) IMPLANT
CATH WEBSTER BI DIR CS D-F CRV (CATHETERS) IMPLANT
CLOSURE PERCLOSE PROSTYLE (VASCULAR PRODUCTS) IMPLANT
COVER SWIFTLINK CONNECTOR (BAG) ×1 IMPLANT
KIT VERSACROSS STEERABLE D1 (CATHETERS) IMPLANT
PACK EP LATEX FREE (CUSTOM PROCEDURE TRAY) ×1
PACK EP LF (CUSTOM PROCEDURE TRAY) ×1 IMPLANT
PAD DEFIB RADIO PHYSIO CONN (PAD) ×1 IMPLANT
PATCH CARTO3 (PAD) IMPLANT
SHEATH CARTO VIZIGO SM CVD (SHEATH) IMPLANT
SHEATH PINNACLE 7F 10CM (SHEATH) IMPLANT
SHEATH PINNACLE 8F 10CM (SHEATH) IMPLANT
SHEATH PINNACLE 9F 10CM (SHEATH) IMPLANT
SHEATH PROBE COVER 6X72 (BAG) IMPLANT
TUBING SMART ABLATE COOLFLOW (TUBING) IMPLANT

## 2022-05-08 NOTE — Telephone Encounter (Signed)
Pt called stating that his ablation site is bleeding. He wants to know should he come by the office or go to ER.

## 2022-05-08 NOTE — Telephone Encounter (Signed)
Patient called in to report his ablation groin site has continues to ooze and that the blood has soaked through the dressing. He is not experiencing any pain, no signs of swelling in leg, or pain in hip or groin. No hematoma noted per patient. We discussed options, and he has decided to change dressing and apply pressure to see if it completely stops. If it continues to ooze, if he develops swelling in leg, pain in hip or groin he will to to the ED for evaluation.

## 2022-05-08 NOTE — Progress Notes (Signed)
Client up and walked and tolerated well; left groin dressing with small amt of bleeding noted, dressing changed and no further bleeding noted after walking

## 2022-05-08 NOTE — Anesthesia Procedure Notes (Signed)
Procedure Name: Intubation Date/Time: 05/08/2022 7:47 AM  Performed by: Inda Coke, CRNAPre-anesthesia Checklist: Patient identified, Emergency Drugs available, Suction available, Timeout performed and Patient being monitored Patient Re-evaluated:Patient Re-evaluated prior to induction Oxygen Delivery Method: Circle system utilized Preoxygenation: Pre-oxygenation with 100% oxygen Induction Type: IV induction Ventilation: Mask ventilation without difficulty Laryngoscope Size: Mac and 4 Grade View: Grade II Tube type: Oral Tube size: 8.0 mm Airway Equipment and Method: Stylet Placement Confirmation: ETT inserted through vocal cords under direct vision, positive ETCO2, CO2 detector and breath sounds checked- equal and bilateral Secured at: 23 cm Tube secured with: Tape Dental Injury: Teeth and Oropharynx as per pre-operative assessment

## 2022-05-08 NOTE — Anesthesia Postprocedure Evaluation (Signed)
Anesthesia Post Note  Patient: Brian [name]  Procedure(s) Performed: ATRIAL FIBRILLATION ABLATION     Patient location during evaluation: PACU Anesthesia Type: General Level of consciousness: sedated and patient cooperative Pain management: pain level controlled Vital Signs Assessment: post-procedure vital signs reviewed and stable Respiratory status: spontaneous breathing Cardiovascular status: stable Anesthetic complications: no   There were no known notable events for this encounter.  Last Vitals:  Vitals:   05/08/22 1200 05/08/22 1357  BP: 98/73 122/87  Pulse: 98 91  Resp: 12 14  Temp:    SpO2: 90% 95%    Last Pain:  Vitals:   05/08/22 1035  TempSrc:   PainSc: 0-No pain                 Lewie Loron

## 2022-05-08 NOTE — Transfer of Care (Signed)
Immediate Anesthesia Transfer of Care Note  Patient: Taiven Greenley  Procedure(s) Performed: ATRIAL FIBRILLATION ABLATION  Patient Location: Cath Lab  Anesthesia Type:General  Level of Consciousness: awake, alert  and oriented  Airway & Oxygen Therapy: Patient Spontanous Breathing and Patient connected to nasal cannula oxygen  Post-op Assessment: Report given to RN and Post -op Vital signs reviewed and stable  Post vital signs: Reviewed and stable  Last Vitals:  Vitals Value Taken Time  BP 109/75 05/08/22 0947  Temp    Pulse 84 05/08/22 0950  Resp 13 05/08/22 0950  SpO2 98 % 05/08/22 0950  Vitals shown include unvalidated device data.  Last Pain:  Vitals:   05/08/22 0542  PainSc: 0-No pain      Patients Stated Pain Goal: 4 (05/08/22 0542)  Complications: There were no known notable events for this encounter.

## 2022-05-08 NOTE — Interval H&P Note (Signed)
History and Physical Interval Note:  05/08/2022 6:40 AM  Brian [name]  has presented today for surgery, with the diagnosis of afib.  The various methods of treatment have been discussed with the patient and family. After consideration of risks, benefits and other options for treatment, the patient has consented to  Procedure(s): ATRIAL FIBRILLATION ABLATION (N/A) as a surgical intervention.  The patient's history has been reviewed, patient examined, no change in status, stable for surgery.  I have reviewed the patient's chart and labs.  Questions were answered to the patient's satisfaction.     Brian [name] [employer]

## 2022-05-09 ENCOUNTER — Telehealth: Payer: Self-pay | Admitting: Cardiology

## 2022-05-09 ENCOUNTER — Encounter (HOSPITAL_COMMUNITY): Payer: Self-pay | Admitting: Cardiology

## 2022-05-09 ENCOUNTER — Encounter: Payer: Self-pay | Admitting: Cardiology

## 2022-05-09 NOTE — Telephone Encounter (Signed)
Pt advised he can take groin bandages off now, and shower today per Dr. Elberta Fortis. Aware he may start driving after mid-day Friday per Dr. Elberta Fortis. Patient verbalized understanding and agreeable to plan.

## 2022-05-09 NOTE — Telephone Encounter (Signed)
Patient would like to know when he can remove the dressings for ablation, when can he shower, and when can he drive.

## 2022-06-05 ENCOUNTER — Ambulatory Visit (HOSPITAL_COMMUNITY)
Admit: 2022-06-05 | Discharge: 2022-06-05 | Disposition: A | Discharge status: Home / Self Care | Payer: Medicare Other | Attending: Physician Assistant | Admitting: Physician Assistant

## 2022-06-05 ENCOUNTER — Encounter (HOSPITAL_COMMUNITY): Payer: Self-pay | Admitting: Physician Assistant

## 2022-06-05 VITALS — BP 132/90 | HR 81 | Ht 69.0 in | Wt 172.0 lb

## 2022-06-05 DIAGNOSIS — Z7901 Long term (current) use of anticoagulants: Secondary | ICD-10-CM | POA: Insufficient documentation

## 2022-06-05 DIAGNOSIS — D6869 Other thrombophilia: Secondary | ICD-10-CM | POA: Insufficient documentation

## 2022-06-05 DIAGNOSIS — E785 Hyperlipidemia, unspecified: Secondary | ICD-10-CM | POA: Diagnosis not present

## 2022-06-05 DIAGNOSIS — I480 Paroxysmal atrial fibrillation: Secondary | ICD-10-CM | POA: Diagnosis present

## 2022-06-05 NOTE — Progress Notes (Signed)
Primary Care Physician: Pearline Cablesopland, Jessica C, MD Primary Cardiologist: Dr Herbie BaltimoreHarding Primary Electrophysiologist: Dr Elberta Fortisamnitz Referring Physician: Dr Clemon ChambersHarding   Brian [name] is a 71 y.o. male with a history of HLD and atrial fibrillation who presents for follow up in the [facility].  The patient was initially diagnosed with atrial fibrillation in 2017 in the setting of viral duodenitis. Patient has a CHADS2VASC score of 1. He was in his usual state of health until he saw his PCP on 02/19/22 and was incidentally noted to be in afib with RVR. Dr Herbie BaltimoreHarding was notified and patient was started on Xarelto 20 mg daily and diltiazem 180 mg daily. He was in SR on follow up. He had no awareness of his arrhythmia at that time.  Patient was seen at the ED 04/03/22 for afib with RVR. His Kardia mobile had shown heart rates as high as 160 bpm and he had symptoms of weakness and lightheadedness. ECG on arrival to the ED showed SR with PACs. His diltiazem was increased. There were no specific triggers that the patient could identify.   On follow up today, patient is s/p afib ablation with Dr Elberta Fortisamnitz on 05/08/22. He does report a few afib episodes in the first two weeks post ablation. He has also had some "tachycardia" on his Lourena SimmondsKardia mobile which could be some atypical flutter. The episodes are becoming less frequent. He denies chest pain, swallowing pain, or groin issues.   Today, he denies symptoms of chest pain, shortness of breath, orthopnea, PND, lower extremity edema, dizziness, presyncope, syncope, snoring, daytime somnolence, bleeding, or neurologic sequela. The patient is tolerating medications without difficulties and is otherwise without complaint today.    Atrial Fibrillation Risk Factors:  he does not have symptoms or diagnosis of sleep apnea. he does not have a history of rheumatic fever. he does not have a history of alcohol use. The patient does have a history of early  familial atrial fibrillation or other arrhythmias. Father had afib.  he has a BMI of Body mass index is 25.4 kg/m.Marland Kitchen. Filed Weights   06/05/22 1031  Weight: 78 kg    Family History  Problem Relation Age of Onset   Cancer Mother    Cancer Father    Heart disease Father    Stroke Father    Cancer Maternal Grandmother    Stroke Maternal Grandfather    Coronary artery disease Other    Cancer Other    Stroke Other    Arrhythmia Other      Atrial Fibrillation Management history:  Previous antiarrhythmic drugs: none Previous cardioversions: none Previous ablations: 05/08/22 CHADS2VASC score: 2 Anticoagulation history: Xarelto    Past Medical History:  Diagnosis Date   Depression    Duodenitis 10/10/2015   PAF (paroxysmal atrial fibrillation) (HCC) Jan 2017   lone AF in setting of sepsis   Sepsis secondary to UTI (HCC) 10/10/2015   Past Surgical History:  Procedure Laterality Date   APPENDECTOMY     ATRIAL FIBRILLATION ABLATION N/A 05/08/2022   Procedure: ATRIAL FIBRILLATION ABLATION;  Surgeon: Regan Lemmingamnitz, Will Martin, MD;  [facility];  Service: Cardiovascular;  Laterality: N/A;   CARDIOVASCULAR STRESS TEST  01/22/2014   GXT - Ex 10 min (10.9 METs), HR 164 = 103% MPHR. No ST-T wave chagnes or Sx.  LOW RISK  --Duke TM Score 10    HOLTER MONITOR  10/2015   Mostly normal sinus rhythm. Recommended avoiding stimulants, energy drinks and decongestants.  No signs of A. fib.Marland Kitchen PVCs noted in pairs, singles, bigeminy and trigeminy. No A. fib.   TRANSTHORACIC ECHOCARDIOGRAM  02/2014   Normal LV size and function.  EF 68%.  Mild MR.  Mild TR.  No pulmonary hypertension.  Blood normal.   VASECTOMY      Current Outpatient Medications  Medication Sig Dispense Refill   atorvastatin (LIPITOR) 10 MG tablet Take 1 tablet (10 mg total) by mouth daily. 90 tablet 3   diltiazem (CARDIZEM CD) 240 MG 24 hr capsule Take 1 capsule (240 mg total) by mouth daily. 30 capsule 3   mirtazapine  (REMERON) 7.5 MG tablet TAKE 1 TABLET BY MOUTH AT BEDTIME 30 tablet 3   Multiple Vitamin (MULTIVITAMIN) tablet Take 1 tablet by mouth daily.     PARoxetine (PAXIL) 30 MG tablet TAKE 1 TABLET BY MOUTH EVERY DAY 90 tablet 3   rivaroxaban (XARELTO) 20 MG TABS tablet Take 1 tablet (20 mg total) by mouth daily with supper. 30 tablet 5   No current facility-administered medications for this encounter.    Allergies  Allergen Reactions   Sulfa Antibiotics     childhood    Social History   Socioeconomic History   Marital status: Married    Spouse name: Tatsuo Musial   Number of children: 2   Years of education: College   Highest education level: Not on file  Occupational History   Occupation: retired Midwife  Tobacco Use   Smoking status: Never   Smokeless tobacco: Never   Tobacco comments:    Never smoke 02/22/22  Vaping Use   Vaping Use: Never used  Substance and Sexual Activity   Alcohol use: No    Alcohol/week: 0.0 standard drinks of alcohol   Drug use: No   Sexual activity: Yes    Partners: Female  Other Topics Concern   Not on file  Social History Narrative   He is married to [name] - who is a Teacher, early years/pre.      He is a retired Emergency planning/management officer, he has 2 school-aged grandchildren who are now 7 and 9.        He enjoys helping to take care of them, building furniture and volunteering with church and [employer].  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- this is still up and running       Exercise- silver sneakers   Left handed   Two story home   Social Determinants of Health   Financial Resource Strain: Low Risk  (03/21/2021)   Overall Financial Resource Strain (CARDIA)    Difficulty of Paying Living Expenses: Not hard at all  Food Insecurity: No Food Insecurity (03/21/2021)   Hunger Vital Sign    Worried About  Running Out of Food in the Last Year: Never true    Ran Out of Food in the Last Year: Never true  Transportation Needs: No Transportation Needs (03/21/2021)   PRAPARE - Administrator, Civil Service (Medical): No    Lack of Transportation (Non-Medical): No  Physical Activity: Sufficiently Active (03/21/2021)   Exercise Vital Sign    Days of Exercise per Week: 7 days    Minutes of Exercise per Session: 30 min  Stress: No Stress Concern Present (03/21/2021)   [employer] of Occupational Health - Occupational  Stress Questionnaire    Feeling of Stress : Not at all  Social Connections: Socially Integrated (03/21/2021)   Social Connection and Isolation Panel [NHANES]    Frequency of Communication with Friends and Family: More than three times a week    Frequency of Social Gatherings with Friends and Family: More than three times a week    Attends Religious Services: More than 4 times per year    Active Member of [employer] or Organizations: Yes    Attends Music therapist: More than 4 times per year    Marital Status: Married  Human resources officer Violence: Not At Risk (03/21/2021)   Humiliation, Afraid, Rape, and Kick questionnaire    Fear of Current or Ex-Partner: No    Emotionally Abused: No    Physically Abused: No    Sexually Abused: No     ROS- All systems are reviewed and negative except as per the HPI above.  Physical Exam: Vitals:   06/05/22 1031  BP: (!) 132/90  Pulse: 81  Weight: 78 kg  Height: 5\' 9"  (1.753 m)     GEN- The patient is a well appearing male, alert and oriented x 3 today.   HEENT-head normocephalic, atraumatic, sclera clear, conjunctiva pink, hearing intact, trachea midline. Lungs- Clear to ausculation bilaterally, normal work of breathing Heart- Regular rate and rhythm, no murmurs, rubs or gallops  GI- soft, NT, ND, + BS Extremities- no clubbing, cyanosis, or edema MS- no significant deformity or atrophy Skin- no rash or lesion Psych-  euthymic mood, full affect Neuro- strength and sensation are intact   Wt Readings from Last 3 Encounters:  06/05/22 78 kg  05/08/22 77.1 kg  04/30/22 79.1 kg    EKG today demonstrates  SR Vent. rate 81 BPM PR interval 184 ms QRS duration 80 ms QT/QTcB 366/425 ms   Echo 03/05/22 demonstrated   1. Left ventricular ejection fraction, by estimation, is 60 to 65%. The  left ventricle has normal function. The left ventricle has no regional  wall motion abnormalities. Left ventricular diastolic parameters were  normal.   2. Right ventricular systolic function is normal. The right ventricular  size is normal. There is normal pulmonary artery systolic pressure. The  estimated right ventricular systolic pressure is 47.8 mmHg.   3. The mitral valve is grossly normal. Trivial mitral valve  regurgitation. No evidence of mitral stenosis.   4. The aortic valve is tricuspid. Aortic valve regurgitation is not  visualized. No aortic stenosis is present.   5. The inferior vena cava is normal in size with greater than 50%  respiratory variability, suggesting right atrial pressure of 3 mmHg.   Conclusion(s)/Recommendation(s): Normal biventricular function without evidence of hemodynamically significant valvular heart disease.   Epic records are reviewed at length today  CHA2DS2-VASc Score = 2  The patient's score is based upon: CHF History: 0 HTN History: 0 Diabetes History: 0 Stroke History: 0 Vascular Disease History: 1 (aortic atherosclerosis) Age Score: 1 Gender Score: 0       ASSESSMENT AND PLAN: 1. Paroxysmal Atrial Fibrillation (ICD10:  I48.0) The patient's CHA2DS2-VASc score is 2, indicating a 2.2% annual risk of stroke.   S/p afib ablation 05/08/22 We discussed that it is not unusual to have episodes of afib soon after ablation.  Continue Xarelto 20 mg daily with no missed doses for at least 3 months post ablation. Continue diltiazem 240 mg daily Kardia mobile for home  monitoring.   2. Secondary Hypercoagulable State (ICD10:  D68.69)  The patient is at significant risk for stroke/thromboembolism based upon his CHA2DS2-VASc Score of 2.  Continue Rivaroxaban (Xarelto).     Follow up with Dr Elberta Fortis as scheduled.    Jorja Loa PA-C Af[facility] [address] [place], [place] [address] [phone] 06/05/2022 10:35 AM

## 2022-06-09 NOTE — Progress Notes (Unsigned)
[facility] [address] [place], [place] [address] [phone]  Date:  06/11/2022   Name:  Brian [name]   DOB:  02-28-1951   MRN:  [id number]  PCP:  Darreld Mclean, MD    Chief Complaint: No chief complaint on file.   History of Present Illness:  Brian [name] is a 71 y.o. very pleasant male patient who presents with the following:  Periodic recheck today  Last seen by myself in June- history of A fib, dyslipidemia and depression, prediabetes  At our last visit [name] was suffering from some depression and found to be in a fib with RVR-  Patient seen today to discuss depression.  However, incidentally he was found to be in atrial for with RVR.  I called and discussed with his cardiologist Dr. Ellyn Hack.  Patient is really asymptomatic, no chest pain or shortness of breath.  Pt is offered emergency department, but would prefer to treat outpatient if possible We decided to start Xarelto 20 mg, diltiazem XR 180.  Cardiology will bring him into atrial fib clinic ASAP  He underwent an ablation on 05/08/22-  Seen in A fib clinic on 9/19 with Dr Curt Bears: ASSESSMENT AND PLAN: 1. Paroxysmal Atrial Fibrillation (ICD10:  I48.0) The patient's CHA2DS2-VASc score is 2, indicating a 2.2% annual risk of stroke.   S/p afib ablation 05/08/22 We discussed that it is not unusual to have episodes of afib soon after ablation.  Continue Xarelto 20 mg daily with no missed doses for at least 3 months post ablation. Continue diltiazem 240 mg daily Kardia mobile for home monitoring.  2. Secondary Hypercoagulable State (ICD10:  D68.69) The patient is at significant risk for stroke/thromboembolism based upon his CHA2DS2-VASc Score of 2.  Continue Rivaroxaban (Xarelto).    Covid booster Flu shot RSV    Patient Active Problem List   Diagnosis Date Noted   Secondary hypercoagulable state ([place]) 06/05/2022   History of heat exhaustion  06/19/2020   Exercise intolerance 06/19/2020   Prediabetes 05/28/2020   BMI 25.0-25.9,adult 11/11/2015   Family history of coronary artery disease in father 10/25/2015   Paroxysmal atrial fibrillation ([place]) 10/11/2015   Dyslipidemia 10/10/2015   Depression     Past Medical History:  Diagnosis Date   Depression    Duodenitis 10/10/2015   PAF (paroxysmal atrial fibrillation) ([place]) Jan 2017   lone AF in setting of sepsis   Sepsis secondary to UTI ([place]) 10/10/2015    Past Surgical History:  Procedure Laterality Date   APPENDECTOMY     ATRIAL FIBRILLATION ABLATION N/A 05/08/2022   Procedure: ATRIAL FIBRILLATION ABLATION;  Surgeon: Constance Haw, MD;  Location: [place] [facility];  Service: Cardiovascular;  Laterality: N/A;   CARDIOVASCULAR STRESS TEST  01/22/2014   GXT - Ex 10 min (10.9 METs), HR 164 = 103% MPHR. No ST-T wave chagnes or Sx.  LOW RISK  --[facility] TM Score 10    HOLTER MONITOR  10/2015   Mostly normal sinus rhythm. Recommended avoiding stimulants, energy drinks and decongestants. No signs of A. fib.Marland Kitchen PVCs noted in pairs, singles, bigeminy and trigeminy. No A. fib.   TRANSTHORACIC ECHOCARDIOGRAM  02/2014   Normal LV size and function.  EF 68%.  Mild MR.  Mild TR.  No pulmonary hypertension.  Blood normal.   VASECTOMY      Social History   Tobacco Use   Smoking status: Never   Smokeless tobacco: Never  Tobacco comments:    Never smoke 02/22/22  Vaping Use   Vaping Use: Never used  Substance Use Topics   Alcohol use: No    Alcohol/week: 0.0 standard drinks of alcohol   Drug use: No    Family History  Problem Relation Age of Onset   Cancer Mother    Cancer Father    Heart disease Father    Stroke Father    Cancer Maternal Grandmother    Stroke Maternal Grandfather    Coronary artery disease Other    Cancer Other    Stroke Other    Arrhythmia Other     Allergies  Allergen Reactions   Sulfa Antibiotics     childhood    Medication list has been  reviewed and updated.  Current Outpatient Medications on File Prior to Visit  Medication Sig Dispense Refill   atorvastatin (LIPITOR) 10 MG tablet Take 1 tablet (10 mg total) by mouth daily. 90 tablet 3   diltiazem (CARDIZEM CD) 240 MG 24 hr capsule Take 1 capsule (240 mg total) by mouth daily. 30 capsule 3   mirtazapine (REMERON) 7.5 MG tablet TAKE 1 TABLET BY MOUTH AT BEDTIME 30 tablet 3   Multiple Vitamin (MULTIVITAMIN) tablet Take 1 tablet by mouth daily.     PARoxetine (PAXIL) 30 MG tablet TAKE 1 TABLET BY MOUTH EVERY DAY 90 tablet 3   rivaroxaban (XARELTO) 20 MG TABS tablet Take 1 tablet (20 mg total) by mouth daily with supper. 30 tablet 5   No current facility-administered medications on file prior to visit.    Review of Systems:  As per HPI- otherwise negative.   Physical Examination: There were no vitals filed for this visit. There were no vitals filed for this visit. There is no height or weight on file to calculate BMI. Ideal Body Weight:    GEN: no acute distress. HEENT: Atraumatic, Normocephalic.  Ears and Nose: No external deformity. CV: RRR, No M/G/R. No JVD. No thrill. No extra heart sounds. PULM: CTA B, no wheezes, crackles, rhonchi. No retractions. No resp. distress. No accessory muscle use. ABD: S, NT, ND, +BS. No rebound. No HSM. EXTR: No c/c/e PSYCH: Normally interactive. Conversant.    Assessment and Plan: ***  Signed Abbe Amsterdam, MD

## 2022-06-09 NOTE — Patient Instructions (Incomplete)
Good to see you again today!  I will send a message to Dr Elberta Fortis about your pulse rate  We will try going up to Remeron 15 mg; there is some risk of serotonin overload in combination with paxil.  This is rare, but watch for any sign of serotonin syndrome  Agitation or restlessness Insomnia Confusion Rapid heart rate and high blood pressure Dilated pupils Loss of muscle coordination or twitching muscles High blood pressure Muscle rigidity Heavy sweating Diarrhea Headache Shivering Goose bumps  Assuming all is well please see me in about 6 months Consider RSV vaccine at your pharmacy

## 2022-06-11 ENCOUNTER — Ambulatory Visit (INDEPENDENT_AMBULATORY_CARE_PROVIDER_SITE_OTHER): Payer: Medicare Other | Admitting: Family Medicine

## 2022-06-11 ENCOUNTER — Encounter: Payer: Self-pay | Admitting: Family Medicine

## 2022-06-11 ENCOUNTER — Ambulatory Visit: Payer: Medicare Other

## 2022-06-11 VITALS — BP 120/82 | HR 95 | Temp 97.9°F | Resp 18 | Ht 68.0 in | Wt 177.0 lb

## 2022-06-11 DIAGNOSIS — Z125 Encounter for screening for malignant neoplasm of prostate: Secondary | ICD-10-CM | POA: Diagnosis not present

## 2022-06-11 DIAGNOSIS — R7303 Prediabetes: Secondary | ICD-10-CM

## 2022-06-11 DIAGNOSIS — G4700 Insomnia, unspecified: Secondary | ICD-10-CM | POA: Diagnosis not present

## 2022-06-11 DIAGNOSIS — I480 Paroxysmal atrial fibrillation: Secondary | ICD-10-CM | POA: Diagnosis not present

## 2022-06-11 LAB — PSA: PSA: 2.57 ng/mL (ref 0.10–4.00)

## 2022-06-11 LAB — CALCIUM: Calcium: 9.4 mg/dL (ref 8.4–10.5)

## 2022-06-11 MED ORDER — DILTIAZEM HCL ER COATED BEADS 360 MG PO CP24: 360.0000 mg | ORAL_CAPSULE | Freq: Every day | ORAL | 3 refills | Status: DC

## 2022-06-11 MED ORDER — MIRTAZAPINE 15 MG PO TABS: 15.0000 mg | ORAL_TABLET | Freq: Every day | ORAL | 3 refills | Status: DC

## 2022-06-12 ENCOUNTER — Encounter: Payer: Self-pay | Admitting: Family Medicine

## 2022-06-12 LAB — HEMOGLOBIN A1C: Hgb A1c MFr Bld: 6.2 % (ref 4.6–6.5)

## 2022-06-20 ENCOUNTER — Other Ambulatory Visit: Payer: Self-pay | Admitting: Family Medicine

## 2022-06-20 DIAGNOSIS — E7800 Pure hypercholesterolemia, unspecified: Secondary | ICD-10-CM

## 2022-07-11 ENCOUNTER — Other Ambulatory Visit: Payer: Self-pay | Admitting: Family Medicine

## 2022-07-11 DIAGNOSIS — I480 Paroxysmal atrial fibrillation: Secondary | ICD-10-CM

## 2022-08-13 ENCOUNTER — Ambulatory Visit: Payer: Medicare Other | Admitting: Cardiology

## 2022-08-16 ENCOUNTER — Ambulatory Visit: Payer: Medicare Other | Admitting: Cardiology

## 2022-09-03 ENCOUNTER — Encounter: Payer: Self-pay | Admitting: Cardiology

## 2022-09-03 ENCOUNTER — Ambulatory Visit: Payer: Medicare Other | Attending: Cardiology | Admitting: Cardiology

## 2022-09-03 VITALS — BP 120/86 | HR 85 | Ht 68.0 in | Wt 178.0 lb

## 2022-09-03 DIAGNOSIS — I480 Paroxysmal atrial fibrillation: Secondary | ICD-10-CM

## 2022-09-03 NOTE — Addendum Note (Signed)
Addended by: Baird Lyons on: 09/03/2022 02:50 PM   Modules accepted: Orders

## 2022-09-03 NOTE — Progress Notes (Signed)
Electrophysiology Office Note   Date:  09/03/2022   ID:  Brian [name], DOB 05/16/1951, MRN [id number]  PCP:  [name], MD  Cardiologist:  Herbie Baltimore Primary Electrophysiologist:  Jazmine Heckman Jorja Loa, MD    Chief Complaint: AF   History of Present Illness: Brian [name] is a 70 y.o. male who is being seen today for the evaluation of AF at the request of Copland, Gwenlyn Found, MD. Presenting today for electrophysiology evaluation.  He has a history significant for hyperlipidemia and atrial fibrillation.  He was diagnosed with atrial fibrillation in 2017.  He was seen in the emergency room 04/03/2022 with rapid atrial fibrillation.  He is post A-fib ablation 04/30/2022.  Today, denies symptoms of palpitations, chest pain, shortness of breath, orthopnea, PND, lower extremity edema, claudication, dizziness, presyncope, syncope, bleeding, or neurologic sequela. The patient is tolerating medications without difficulties.  Since his ablation he has done well.  He states that he has noted no further episodes of atrial fibrillation initially felt well without complaint   Past Medical History:  Diagnosis Date   Depression    Duodenitis 10/10/2015   PAF (paroxysmal atrial fibrillation) (HCC) Jan 2017   lone AF in setting of sepsis   Sepsis secondary to UTI (HCC) 10/10/2015   Past Surgical History:  Procedure Laterality Date   APPENDECTOMY     ATRIAL FIBRILLATION ABLATION N/A 05/08/2022   Procedure: ATRIAL FIBRILLATION ABLATION;  Surgeon: Regan Lemming, MD;  Location: [facility];  Service: Cardiovascular;  Laterality: N/A;   CARDIOVASCULAR STRESS TEST  01/22/2014   GXT - Ex 10 min (10.9 METs), HR 164 = 103% MPHR. No ST-T wave chagnes or Sx.  LOW RISK  --Duke TM Score 10    HOLTER MONITOR  10/2015   Mostly normal sinus rhythm. Recommended avoiding stimulants, energy drinks and decongestants. No signs of A. fib.Marland Kitchen PVCs noted in pairs, singles, bigeminy and trigeminy. No A. fib.    TRANSTHORACIC ECHOCARDIOGRAM  02/2014   Normal LV size and function.  EF 68%.  Mild MR.  Mild TR.  No pulmonary hypertension.  Blood normal.   VASECTOMY       Current Outpatient Medications  Medication Sig Dispense Refill   atorvastatin (LIPITOR) 10 MG tablet Take 1 tablet (10 mg total) by mouth daily. 90 tablet 0   diltiazem (CARDIZEM CD) 360 MG 24 hr capsule Take 1 capsule (360 mg total) by mouth daily. 30 capsule 3   mirtazapine (REMERON) 15 MG tablet Take 1 tablet (15 mg total) by mouth at bedtime. 90 tablet 3   Multiple Vitamin (MULTIVITAMIN) tablet Take 1 tablet by mouth daily.     PARoxetine (PAXIL) 30 MG tablet TAKE 1 TABLET BY MOUTH EVERY DAY 90 tablet 3   rivaroxaban (XARELTO) 20 MG TABS tablet Take 1 tablet (20 mg total) by mouth daily with supper. 90 tablet 1   No current facility-administered medications for this visit.    Allergies:   Sulfa antibiotics   Social History:  The patient  reports that he has never smoked. He has never used smokeless tobacco. He reports that he does not drink alcohol and does not use drugs.   Family History:  The patient's family history includes Arrhythmia in an other family member; Cancer in his father, maternal grandmother, mother, and another family member; Coronary artery disease in an other family member; Heart disease in his father; Stroke in his father, maternal grandfather, and another family member.   ROS:  Please see  the history of present illness.   Otherwise, review of systems is positive for none.   All other systems are reviewed and negative.   PHYSICAL EXAM: VS:  BP 120/86   Pulse 85   Ht 5\' 8"  (1.727 m)   Wt 178 lb (80.7 kg)   SpO2 97%   BMI 27.06 kg/m  , BMI Body mass index is 27.06 kg/m. GEN: Well nourished, well developed, in no acute distress  HEENT: normal  Neck: no JVD, carotid bruits, or masses Cardiac: RRR; no murmurs, rubs, or gallops,no edema  Respiratory:  clear to auscultation bilaterally, normal work of  breathing GI: soft, nontender, nondistended, + BS MS: no deformity or atrophy  Skin: warm and dry Neuro:  Strength and sensation are intact Psych: euthymic mood, full affect  EKG:  EKG is ordered today. Personal review of the ekg ordered shows sinus rhythm   Recent Labs: 02/19/2022: ALT 24 04/03/2022: Magnesium 1.9; TSH 2.136 04/30/2022: BUN 16; Creatinine, Ser 0.89; Hemoglobin 15.3; Platelets 198; Potassium 4.3; Sodium 141    Lipid Panel     Component Value Date/Time   CHOL 151 12/11/2021 1056   TRIG 89.0 12/11/2021 1056   HDL 60.40 12/11/2021 1056   CHOLHDL 3 12/11/2021 1056   VLDL 17.8 12/11/2021 1056   LDLCALC 73 12/11/2021 1056     Wt Readings from Last 3 Encounters:  09/03/22 178 lb (80.7 kg)  06/11/22 177 lb (80.3 kg)  06/05/22 172 lb (78 kg)      Other studies Reviewed: Additional studies/ records that were reviewed today include: TTE 03/05/22  Review of the above records today demonstrates:  1. Left ventricular ejection fraction, by estimation, is 60 to 65%. The  left ventricle has normal function. The left ventricle has no regional  wall motion abnormalities. Left ventricular diastolic parameters were  normal.   2. Right ventricular systolic function is normal. The right ventricular  size is normal. There is normal pulmonary artery systolic pressure. The  estimated right ventricular systolic pressure is 0000000 mmHg.   3. The mitral valve is grossly normal. Trivial mitral valve  regurgitation. No evidence of mitral stenosis.   4. The aortic valve is tricuspid. Aortic valve regurgitation is not  visualized. No aortic stenosis is present.   5. The inferior vena cava is normal in size with greater than 50%  respiratory variability, suggesting right atrial pressure of 3 mmHg.    ASSESSMENT AND PLAN:  1.  Paroxysmal atrial fibrillation: CHA2DS2-VASc of 1.  Currently on diltiazem 360 mg daily, Xarelto 20 mg daily. Status post ablation 04/30/2022.  He is currently.   He is no further episodes of atrial fibrillation.  Due to CHA2DS2-VASc, we Icarus Partch stop Xarelto.   Current medicines are reviewed at length with the patient today.   The patient does not have concerns regarding his medicines.  The following changes were made today: Stop Xarelto  Labs/ tests ordered today include:  Orders Placed This Encounter  Procedures   EKG 12-Lead     Disposition:   FU 6 months  Signed, Kinze Labo Meredith Leeds, MD  09/03/2022 2:47 PM     [place] [address] [place] [address] [phone] (office) [phone] (fax)

## 2022-09-06 ENCOUNTER — Telehealth: Payer: Self-pay | Admitting: *Deleted

## 2022-09-06 NOTE — Telephone Encounter (Signed)
     Primary Cardiologist: Bryan Lemma, MD  Chart reviewed as part of pre-operative protocol coverage. Given past medical history and time since last visit, based on ACC/AHA guidelines, Brian [name] would be at acceptable risk for the planned procedure without further cardiovascular testing.   His RCRI is a class I risk, 0.4% risk of major cardiac event.  Patient is no longer on Xarelto medication.  Xarelto was discontinued by electrophysiology status post ablation.  I will route this recommendation to the requesting party via Epic fax function and remove from pre-op pool.  Please call with questions.  Thomasene Ripple. Doralyn Kirkes NP-C     09/06/2022, 3:38 PM [facility] [address] Office [phone] Fax [phone]

## 2022-09-06 NOTE — Telephone Encounter (Signed)
   Pre-operative Risk Assessment    Patient Name: Brian [name]  DOB: [birth year] MRN: [id number]      Request for Surgical Clearance    Procedure:   LEFT TOTAL KNEE ARTHORPLASTY  Date of Surgery:  Clearance 12/04/22                                 Surgeon:  DR. Lequita Halt Surgeon's Group or Practice Name:  [facility] Phone number:  [phone] Fax number:  [phone]  ATTN: Aida Raider   Type of Clearance Requested:   - Pharmacy:  [facility] Rivaroxaban (Xarelto) NOT INDICATED   Type of Anesthesia:   CHOICE   Additional requests/questions:    [name]   09/06/2022, 3:16 PM

## 2022-09-11 ENCOUNTER — Other Ambulatory Visit: Payer: Self-pay | Admitting: Family Medicine

## 2022-09-11 DIAGNOSIS — I480 Paroxysmal atrial fibrillation: Secondary | ICD-10-CM

## 2022-09-27 NOTE — Progress Notes (Signed)
NEUROLOGY FOLLOW UP OFFICE NOTE  Brian [name] [id number]  Assessment/Plan:   Episodic tension-type headache, not intractable   1.  Headache prevention:  not indicated 3.  Headache rescue:  Excedrin 4.  Limit use of pain relievers to no more than 2 days out of week to prevent risk of rebound or medication-overuse headache. 5.  Keep headache diary 6.  he has been doing well for quite some time.  He will follow up as needed.   Subjective:  Brian [name] is a 72 year old left-handed white male with history of PAF who follows up for headache.   UPDATE: No longer on amitriptyline but doing well.  Avoiding caffeine.  Headaches are moderate, last 30 minutes with 2 Excedrin and occurs about once a week  A year ago, he reported intermittent left thigh pain for the past 6 months.  If feels like a deep sharp stabbing pain down the anterior thigh.  The pain causes him to instinctively grab his thigh and it abates in 6 to 8 seconds.  No associated back pain, numbness, weakness, or bowel and bladder dysfunction.  It is not positional.  No preceding injury.  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  Straight leg raise and FABER test were negative at that time.  He was referred to physical therapy but since source of the pain was indeterminate, PT was not felt to be helpful.  It ultimately resolved.  He has had knee problems for a couple of years that has been ongoing.  He followed up with his orthopedist and plans to have surgery in March.  He had a heart ablation.  A fib has resolved.  Current NSAIDS:  ASA 81mg  daily; naproxen Current analgesics:  Excedrin Migraine Current triptans:  none Current ergotamine:  none Current anti-emetic:  none Current muscle relaxants:  none Current anti-anxiolytic:  none Current sleep aide:  Ambien 10mg  (rarely); amitriptyline 50mg  QHS Current Antihypertensive medications:   none Current Antidepressant medications:  Amitriptyline 50mg  at bedtime; paroxetine 30mg  daily Current Anticonvulsant medications:  none Current anti-CGRP:  none Current Vitamins/Herbal/Supplements:  MVI Current Antihistamines/Decongestants:  none Other therapy:  none Hormone/birth control:  none   No caffeine   HISTORY:  He has had headaches since his late 66s.  They are usually 3 to 5/10 bitemporal non-throbbing headaches, worse laying down and better when upright.  No nausea, photophobia, phonophobia, osmophobia, vomiting, visual disturbance, numbness or weakness.  Hunger may be a trigger.  They usually last 1 to 3 hours.  He had 9 headaches in last 30 days   History of childhood allergies, which he grew out of. In 2019, a stool collapsed under him and fell on back of his head on concrete floor.  No loss of consciousness or concussion symptoms.   Eye exam has been okay 1/2 cup of coffee daily (may be decaf) Drinks about 2 quarts of water daily No routine exercise. Left knee and hand arthritis.  Takes Aleve daily.   Sleep is good.  He takes Ambien or amitriptyline Once in a while he notes a very mild tremor when using either hand. His father had daily headaches for the last 20 years of his life.  His daughter has headaches.   Past NSAIDS:  ibuprofen Past analgesics:  Fioricet w/codeine Past abortive triptans:  none Past abortive ergotamine:  none Past muscle relaxants:  none Past anti-emetic:  none Past  antihypertensive medications:  none Past antidepressant medications:  none Past anticonvulsant medications:  none Past anti-CGRP:  none Past vitamins/Herbal/Supplements:  none Past antihistamines/decongestants:  none Other past therapies:  none  PAST MEDICAL HISTORY: Past Medical History:  Diagnosis Date   Depression    Duodenitis 10/10/2015   PAF (paroxysmal atrial fibrillation) ([facility]) Jan 2017   lone AF in setting of sepsis   Sepsis secondary to UTI ([facility]) 10/10/2015     MEDICATIONS: Current Outpatient Medications on File Prior to Visit  Medication Sig Dispense Refill   atorvastatin (LIPITOR) 10 MG tablet Take 1 tablet (10 mg total) by mouth daily. 90 tablet 0   diltiazem (CARDIZEM CD) 360 MG 24 hr capsule TAKE 1 CAPSULE BY MOUTH DAILY 30 capsule 3   mirtazapine (REMERON) 15 MG tablet Take 1 tablet (15 mg total) by mouth at bedtime. 90 tablet 3   Multiple Vitamin (MULTIVITAMIN) tablet Take 1 tablet by mouth daily.     PARoxetine (PAXIL) 30 MG tablet TAKE 1 TABLET BY MOUTH EVERY DAY 90 tablet 3   No current facility-administered medications on file prior to visit.    ALLERGIES: Allergies  Allergen Reactions   Sulfa Antibiotics     childhood    FAMILY HISTORY: Family History  Problem Relation Age of Onset   Cancer Mother    Cancer Father    Heart disease Father    Stroke Father    Cancer Maternal Grandmother    Stroke Maternal Grandfather    Coronary artery disease Other    Cancer Other    Stroke Other    Arrhythmia Other       Objective:  Blood pressure 116/79, pulse 93, height 5\' 10"  (1.778 m), weight 178 lb 3.2 oz (80.8 kg), SpO2 94 %. General: No acute distress.  Patient appears well-groomed.   Head:  Normocephalic/atraumatic Eyes:  Fundi examined but not visualized Neck: supple, no paraspinal tenderness, full range of motion Heart:  Regular rate and rhythm Lungs:  Clear to auscultation bilaterally Back: No paraspinal tenderness Neurological Exam: alert and oriented to person, place, and time.  Speech fluent and not dysarthric, language intact.  CN II-XII intact. Bulk and tone normal, muscle strength 5/5 throughout.  Sensation to light touch intact.  Deep tendon reflexes 2+ throughout.  Finger to nose testing intact.  Gait normal, Romberg negative.     Metta Clines, DO  CC: Brian Blinks, MD

## 2022-10-01 ENCOUNTER — Ambulatory Visit (INDEPENDENT_AMBULATORY_CARE_PROVIDER_SITE_OTHER): Payer: Medicare Other | Admitting: Neurology

## 2022-10-01 ENCOUNTER — Encounter: Payer: Self-pay | Admitting: Neurology

## 2022-10-01 VITALS — BP 116/79 | HR 93 | Ht 70.0 in | Wt 178.2 lb

## 2022-10-01 DIAGNOSIS — G44219 Episodic tension-type headache, not intractable: Secondary | ICD-10-CM

## 2022-10-08 ENCOUNTER — Other Ambulatory Visit: Payer: Self-pay | Admitting: Family Medicine

## 2022-10-08 DIAGNOSIS — F32A Depression, unspecified: Secondary | ICD-10-CM

## 2022-11-13 NOTE — Telephone Encounter (Signed)
OUR OFFICE RECEIVED A DUPLICATE REQUEST TODAY. PT WAS CLEARED 09/06/22 BY JESSE CLEAVE, FNP; SEE NOTES PT IS NOT LONGER ON BLOOD THINNER XARELTO; SEE NOTES FROM Coletta Memos, FNP.   I WILL RE-FAX NOTES TODAY.

## 2022-12-13 NOTE — Progress Notes (Addendum)
[place] at [facility] [address] [place], [place] [address] [phone]  Date:  12/17/2022   Name:  Brian [name]   DOB:  28-Aug-1951   MRN:  [id number]  PCP:  Darreld Mclean, MD    Chief Complaint: Annual Exam (Concerns/ questions: pt has had a knee replacement and is recovering well. /AWV due)   History of Present Illness:  Brian [name] is a 72 y.o. very pleasant male patient who presents with the following:  Pt seen today for a CPE-  history of A fib, dyslipidemia and depression, prediabetes   Last seen by myself in September of last year  He had an ablation last summer for a fib- he notes no symptoms  At our last visit he wanted to try increasing remeron to 15 mg -he continues to take this dose, notes it is working well for sleep  Colon due 2016  Seen by neurology in January: Episodic tension-type headache, not intractable 1.  Headache prevention:  not indicated 3.  Headache rescue:  Excedrin 4.  Limit use of pain relievers to no more than 2 days out of week to prevent risk of rebound or medication-overuse headache. 5.  Keep headache diary 6.  he has been doing well for quite some time.  He will follow up as needed.  Most recent electrophysiology in February: ASSESSMENT AND PLAN: 1.  Paroxysmal atrial fibrillation: CHA2DS2-VASc of 1.  Currently on diltiazem 360 mg daily, Xarelto 20 mg daily. Status post ablation 04/30/2022.  He is currently.  He is no further episodes of atrial fibrillation.  Due to CHA2DS2-VASc, we will stop Xarelto.  He has stopped xarelto per cardiology recommendations   Lipitor Dilt Remeron 15 Paxil 30  He got a left-sided knee replacement 2 weeks ago- he is using a cane but is doing well with his rehab, making good progress Per Dr Wynelle Link- recheck tomorrow   He has noted night sweats since our last visit- may occur 4 of 7 nights Seem to be associated with a bad dream of some sort  He  also has noted a mild cough mostly at night when he may wake up to urinate and then go back to bed  Patient Active Problem List   Diagnosis Date Noted   Secondary hypercoagulable state 06/05/2022   History of heat exhaustion 06/19/2020   Exercise intolerance 06/19/2020   Prediabetes 05/28/2020   BMI 25.0-25.9,adult 11/11/2015   Family history of coronary artery disease in father 10/25/2015   Paroxysmal atrial fibrillation 10/11/2015   Dyslipidemia 10/10/2015   Depression     Past Medical History:  Diagnosis Date   Depression    Duodenitis 10/10/2015   PAF (paroxysmal atrial fibrillation) ([name]) Jan 2017   lone AF in setting of sepsis   Sepsis secondary to UTI ([name]) 10/10/2015    Past Surgical History:  Procedure Laterality Date   APPENDECTOMY     ATRIAL FIBRILLATION ABLATION N/A 05/08/2022   Procedure: ATRIAL FIBRILLATION ABLATION;  Surgeon: Constance Haw, MD;  Location: [facility];  Service: Cardiovascular;  Laterality: N/A;   CARDIOVASCULAR STRESS TEST  01/22/2014   GXT - Ex 10 min (10.9 METs), HR 164 = 103% MPHR. No ST-T wave chagnes or Sx.  LOW RISK  --[facility] TM Score 10    HOLTER MONITOR  10/2015   Mostly normal sinus rhythm. Recommended avoiding stimulants, energy drinks and decongestants. No signs of A. fib.Marland Kitchen PVCs noted in  pairs, singles, bigeminy and trigeminy. No A. fib.   TRANSTHORACIC ECHOCARDIOGRAM  02/2014   Normal LV size and function.  EF 68%.  Mild MR.  Mild TR.  No pulmonary hypertension.  Blood normal.   VASECTOMY      Social History   Tobacco Use   Smoking status: Never   Smokeless tobacco: Never   Tobacco comments:    Never smoke 02/22/22  Vaping Use   Vaping Use: Never used  Substance Use Topics   Alcohol use: No    Alcohol/week: 0.0 standard drinks of alcohol   Drug use: No    Family History  Problem Relation Age of Onset   Cancer Mother    Cancer Father    Heart disease Father    Stroke Father    Cancer Maternal Grandmother     Stroke Maternal Grandfather    Coronary artery disease Other    Cancer Other    Stroke Other    Arrhythmia Other     Allergies  Allergen Reactions   Sulfa Antibiotics     childhood    Medication list has been reviewed and updated.  Current Outpatient Medications on File Prior to Visit  Medication Sig Dispense Refill   mirtazapine (REMERON) 15 MG tablet Take 1 tablet (15 mg total) by mouth at bedtime. 90 tablet 3   Multiple Vitamin (MULTIVITAMIN) tablet Take 1 tablet by mouth daily.     No current facility-administered medications on file prior to visit.    Review of Systems:  As per HPI- otherwise negative.   Physical Examination: Vitals:   12/17/22 1025  BP: 110/72  Pulse: 85  Resp: 18  Temp: 97.7 F (36.5 C)  SpO2: 97%   Vitals:   12/17/22 1025  Weight: 174 lb (78.9 kg)  Height: 5\' 8"  (1.727 m)   Body mass index is 26.46 kg/m. Ideal Body Weight: Weight in (lb) to have BMI = 25: 164.1  GEN: no acute distress.  Minimally overweight, looks well HEENT: Atraumatic, Normocephalic.  Ears and Nose: No external deformity. CV: RRR, No M/G/R. No JVD. No thrill. No extra heart sounds. PULM: CTA B, no wheezes, crackles, rhonchi. No retractions. No resp. distress. No accessory muscle use. ABD: S, NT, ND, +BS. No rebound. No HSM. EXTR: No c/c/e PSYCH: Normally interactive. Conversant.  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, no evidence of infection  Assessment and Plan: Physical exam  Paroxysmal atrial fibrillation - Plan: TSH, diltiazem (CARDIZEM CD) 360 MG 24 hr capsule  Insomnia, unspecified type  Prediabetes - Plan: Hemoglobin A1c  Depression, unspecified depression type - Plan: PARoxetine (PAXIL) 30 MG tablet  Dyslipidemia - Plan: Lipid panel  Night sweats - Plan: CBC, Comprehensive metabolic panel, QuantiFERON-TB Gold Plus, HIV Antibody (routine testing w rflx), DG Chest 2 View, CANCELED: DG Chest 2 View  Special screening, prostate cancer -  Plan: PSA  Elevated cholesterol - Plan: atorvastatin (LIPITOR) 10 MG tablet  Patient seen today for physical exam.  History of atrial fibrillation status post ablation, now back in sinus rhythm.  Continue diltiazem Lab work pending as above Patient has noted night sweats.  They seem to be occurring in conjunction with stressful dreams, possible association with change in his medication for sleep, Remeron.  However will evaluate for any pathologic cause with labs, chest x-ray, check HIV and QuantiFERON gold  Signed Lamar Blinks, MD  Received labs as below, message to patient Results for orders placed or performed in visit on 12/17/22  CBC  Result Value Ref Range   WBC 6.6 4.0 - 10.5 K/uL   RBC 4.17 (L) 4.22 - 5.81 Mil/uL   Platelets 384.0 150.0 - 400.0 K/uL   Hemoglobin 12.6 (L) 13.0 - 17.0 g/dL   HCT 37.4 (L) 39.0 - 52.0 %   MCV 89.6 78.0 - 100.0 fl   MCHC 33.6 30.0 - 36.0 g/dL   RDW 14.5 11.5 - 15.5 %  Comprehensive metabolic panel  Result Value Ref Range   Sodium 138 135 - 145 mEq/L   Potassium 4.1 3.5 - 5.1 mEq/L   Chloride 104 96 - 112 mEq/L   CO2 29 19 - 32 mEq/L   Glucose, Bld 85 70 - 99 mg/dL   BUN 18 6 - 23 mg/dL   Creatinine, Ser 0.92 0.40 - 1.50 mg/dL   Total Bilirubin 0.5 0.2 - 1.2 mg/dL   Alkaline Phosphatase 137 (H) 39 - 117 U/L   AST 15 0 - 37 U/L   ALT 15 0 - 53 U/L   Total Protein 6.8 6.0 - 8.3 g/dL   Albumin 4.0 3.5 - 5.2 g/dL   GFR 84.54 >60.00 mL/min   Calcium 9.5 8.4 - 10.5 mg/dL  Hemoglobin A1c  Result Value Ref Range   Hgb A1c MFr Bld 6.1 4.6 - 6.5 %  Lipid panel  Result Value Ref Range   Cholesterol 148 0 - 200 mg/dL   Triglycerides 137.0 0.0 - 149.0 mg/dL   HDL 42.60 >39.00 mg/dL   VLDL 27.4 0.0 - 40.0 mg/dL   LDL Cholesterol 78 0 - 99 mg/dL   Total CHOL/HDL Ratio 3    NonHDL 105.32   TSH  Result Value Ref Range   TSH 2.62 0.35 - 5.50 uIU/mL  PSA  Result Value Ref Range   PSA 2.50 0.10 - 4.00 ng/mL

## 2022-12-17 ENCOUNTER — Ambulatory Visit (INDEPENDENT_AMBULATORY_CARE_PROVIDER_SITE_OTHER): Payer: Medicare Other | Admitting: Family Medicine

## 2022-12-17 ENCOUNTER — Encounter: Payer: Self-pay | Admitting: Family Medicine

## 2022-12-17 VITALS — BP 110/72 | HR 85 | Temp 97.7°F | Resp 18 | Ht 68.0 in | Wt 174.0 lb

## 2022-12-17 DIAGNOSIS — E7800 Pure hypercholesterolemia, unspecified: Secondary | ICD-10-CM

## 2022-12-17 DIAGNOSIS — Z0000 Encounter for general adult medical examination without abnormal findings: Secondary | ICD-10-CM | POA: Diagnosis not present

## 2022-12-17 DIAGNOSIS — D649 Anemia, unspecified: Secondary | ICD-10-CM

## 2022-12-17 DIAGNOSIS — Z125 Encounter for screening for malignant neoplasm of prostate: Secondary | ICD-10-CM | POA: Diagnosis not present

## 2022-12-17 DIAGNOSIS — G4700 Insomnia, unspecified: Secondary | ICD-10-CM | POA: Diagnosis not present

## 2022-12-17 DIAGNOSIS — R7303 Prediabetes: Secondary | ICD-10-CM | POA: Diagnosis not present

## 2022-12-17 DIAGNOSIS — E785 Hyperlipidemia, unspecified: Secondary | ICD-10-CM

## 2022-12-17 DIAGNOSIS — F32A Depression, unspecified: Secondary | ICD-10-CM

## 2022-12-17 DIAGNOSIS — I480 Paroxysmal atrial fibrillation: Secondary | ICD-10-CM | POA: Diagnosis not present

## 2022-12-17 DIAGNOSIS — R61 Generalized hyperhidrosis: Secondary | ICD-10-CM

## 2022-12-17 LAB — LIPID PANEL
Cholesterol: 148 mg/dL (ref 0–200)
HDL: 42.6 mg/dL (ref >39.00)
LDL Cholesterol: 78 mg/dL (ref 0–99)
NonHDL: 105.32
Total CHOL/HDL Ratio: 3
Triglycerides: 137 mg/dL (ref 0.0–149.0)
VLDL: 27.4 mg/dL (ref 0.0–40.0)

## 2022-12-17 LAB — COMPREHENSIVE METABOLIC PANEL
ALT: 15 U/L (ref 0–53)
AST: 15 U/L (ref 0–37)
Albumin: 4 g/dL (ref 3.5–5.2)
Alkaline Phosphatase: 137 U/L — ABNORMAL HIGH (ref 39–117)
BUN: 18 mg/dL (ref 6–23)
CO2: 29 mEq/L (ref 19–32)
Calcium: 9.5 mg/dL (ref 8.4–10.5)
Chloride: 104 mEq/L (ref 96–112)
Creatinine, Ser: 0.92 mg/dL (ref 0.40–1.50)
GFR: 84.54 mL/min (ref >60.00)
Glucose, Bld: 85 mg/dL (ref 70–99)
Potassium: 4.1 mEq/L (ref 3.5–5.1)
Sodium: 138 mEq/L (ref 135–145)
Total Bilirubin: 0.5 mg/dL (ref 0.2–1.2)
Total Protein: 6.8 g/dL (ref 6.0–8.3)

## 2022-12-17 LAB — TSH: TSH: 2.62 u[IU]/mL (ref 0.35–5.50)

## 2022-12-17 LAB — CBC
HCT: 37.4 % — ABNORMAL LOW (ref 39.0–52.0)
Hemoglobin: 12.6 g/dL — ABNORMAL LOW (ref 13.0–17.0)
MCHC: 33.6 g/dL (ref 30.0–36.0)
MCV: 89.6 fl (ref 78.0–100.0)
Platelets: 384 10*3/uL (ref 150.0–400.0)
RBC: 4.17 Mil/uL — ABNORMAL LOW (ref 4.22–5.81)
RDW: 14.5 % (ref 11.5–15.5)
WBC: 6.6 10*3/uL (ref 4.0–10.5)

## 2022-12-17 LAB — PSA: PSA: 2.5 ng/mL (ref 0.10–4.00)

## 2022-12-17 LAB — HEMOGLOBIN A1C: Hgb A1c MFr Bld: 6.1 % (ref 4.6–6.5)

## 2022-12-17 MED ORDER — DILTIAZEM HCL ER COATED BEADS 360 MG PO CP24: 360.0000 mg | ORAL_CAPSULE | Freq: Every day | ORAL | 3 refills | Status: DC

## 2022-12-17 MED ORDER — PAROXETINE HCL 30 MG PO TABS: 30.0000 mg | ORAL_TABLET | Freq: Every day | ORAL | 3 refills | Status: DC

## 2022-12-17 MED ORDER — ATORVASTATIN CALCIUM 10 MG PO TABS: 10.0000 mg | ORAL_TABLET | Freq: Every day | ORAL | 3 refills | Status: DC

## 2022-12-17 NOTE — Patient Instructions (Addendum)
Great to see you again today- I will be in touch with your labs asap Please stop by [facility] later this well to have a chest x-ray, I will be in touch with the results

## 2022-12-17 NOTE — Addendum Note (Signed)
Addended by: Darreld Mclean on: 12/17/2022 06:23 PM   Modules accepted: Orders

## 2022-12-18 ENCOUNTER — Ambulatory Visit
Admission: RE | Admit: 2022-12-18 | Discharge: 2022-12-18 | Disposition: A | Discharge status: Home / Self Care | Payer: Medicare Other | Source: Ambulatory Visit | Attending: Family Medicine | Admitting: Family Medicine

## 2022-12-18 ENCOUNTER — Encounter: Payer: Self-pay | Admitting: Family Medicine

## 2022-12-18 DIAGNOSIS — R911 Solitary pulmonary nodule: Secondary | ICD-10-CM

## 2022-12-18 DIAGNOSIS — R61 Generalized hyperhidrosis: Secondary | ICD-10-CM

## 2022-12-19 LAB — QUANTIFERON-TB GOLD PLUS
Mitogen-NIL: 8.51 IU/mL
NIL: 0.02 IU/mL
QuantiFERON-TB Gold Plus: NEGATIVE
TB1-NIL: <0 IU/mL
TB2-NIL: <0 IU/mL

## 2022-12-19 LAB — HIV ANTIBODY (ROUTINE TESTING W REFLEX): HIV 1&2 Ab, 4th Generation: NONREACTIVE

## 2022-12-20 ENCOUNTER — Encounter: Payer: Self-pay | Admitting: Family Medicine

## 2022-12-22 ENCOUNTER — Ambulatory Visit (HOSPITAL_BASED_OUTPATIENT_CLINIC_OR_DEPARTMENT_OTHER)
Admission: RE | Admit: 2022-12-22 | Discharge: 2022-12-22 | Disposition: A | Discharge status: Home / Self Care | Payer: Medicare Other | Source: Ambulatory Visit | Attending: Family Medicine | Admitting: Family Medicine

## 2022-12-22 DIAGNOSIS — R911 Solitary pulmonary nodule: Secondary | ICD-10-CM | POA: Diagnosis present

## 2022-12-22 MED ORDER — IOHEXOL 300 MG/ML  SOLN
75.0000 mL | Freq: Once | INTRAMUSCULAR | Status: AC | PRN
  Administered 2022-12-22: 75 mL via INTRAVENOUS

## 2022-12-25 ENCOUNTER — Encounter: Payer: Self-pay | Admitting: Family Medicine

## 2022-12-25 DIAGNOSIS — R61 Generalized hyperhidrosis: Secondary | ICD-10-CM

## 2022-12-28 ENCOUNTER — Other Ambulatory Visit (INDEPENDENT_AMBULATORY_CARE_PROVIDER_SITE_OTHER): Payer: Medicare Other

## 2022-12-28 DIAGNOSIS — R61 Generalized hyperhidrosis: Secondary | ICD-10-CM

## 2022-12-28 DIAGNOSIS — D649 Anemia, unspecified: Secondary | ICD-10-CM

## 2022-12-28 LAB — C-REACTIVE PROTEIN: CRP: <1 mg/dL (ref 0.5–20.0)

## 2022-12-30 ENCOUNTER — Encounter: Payer: Self-pay | Admitting: Family Medicine

## 2022-12-31 LAB — CBC WITH DIFFERENTIAL/PLATELET
Absolute Monocytes: 211 cells/uL (ref 200–950)
Basophils Absolute: 49 cells/uL (ref 0–200)
Basophils Relative: 1 %
Eosinophils Absolute: 240 cells/uL (ref 15–500)
Eosinophils Relative: 4.9 %
HCT: 38.4 % — ABNORMAL LOW (ref 38.5–50.0)
Hemoglobin: 12.9 g/dL — ABNORMAL LOW (ref 13.2–17.1)
Lymphs Abs: 1607 cells/uL (ref 850–3900)
MCH: 29.9 pg (ref 27.0–33.0)
MCHC: 33.6 g/dL (ref 32.0–36.0)
MCV: 88.9 fL (ref 80.0–100.0)
MPV: 9.9 fL (ref 7.5–12.5)
Monocytes Relative: 4.3 %
Neutro Abs: 2793 cells/uL (ref 1500–7800)
Neutrophils Relative %: 57 %
Platelets: 313 10*3/uL (ref 140–400)
RBC: 4.32 10*6/uL (ref 4.20–5.80)
RDW: 13 % (ref 11.0–15.0)
Total Lymphocyte: 32.8 %
WBC: 4.9 10*3/uL (ref 3.8–10.8)

## 2022-12-31 LAB — PATHOLOGIST SMEAR REVIEW

## 2023-01-01 ENCOUNTER — Other Ambulatory Visit: Payer: Self-pay | Admitting: *Deleted

## 2023-01-01 ENCOUNTER — Encounter: Payer: Self-pay | Admitting: Family Medicine

## 2023-01-01 ENCOUNTER — Ambulatory Visit: Payer: Medicare Other

## 2023-01-01 DIAGNOSIS — D649 Anemia, unspecified: Secondary | ICD-10-CM

## 2023-01-01 LAB — FERRITIN: Ferritin: 120 ng/mL (ref 24–380)

## 2023-01-03 ENCOUNTER — Encounter: Payer: Self-pay | Admitting: Family Medicine

## 2023-01-03 DIAGNOSIS — R748 Abnormal levels of other serum enzymes: Secondary | ICD-10-CM

## 2023-01-03 DIAGNOSIS — D649 Anemia, unspecified: Secondary | ICD-10-CM

## 2023-01-04 NOTE — Addendum Note (Signed)
Addended by: Abbe Amsterdam C on: 01/04/2023 03:45 PM   Modules accepted: Orders

## 2023-01-07 ENCOUNTER — Encounter: Payer: Self-pay | Admitting: Family Medicine

## 2023-01-07 DIAGNOSIS — R319 Hematuria, unspecified: Secondary | ICD-10-CM

## 2023-01-08 ENCOUNTER — Other Ambulatory Visit (INDEPENDENT_AMBULATORY_CARE_PROVIDER_SITE_OTHER): Payer: Medicare Other

## 2023-01-08 DIAGNOSIS — R319 Hematuria, unspecified: Secondary | ICD-10-CM | POA: Diagnosis not present

## 2023-01-08 LAB — POCT URINALYSIS DIP (MANUAL ENTRY)
Glucose, UA: NEGATIVE mg/dL
Nitrite, UA: NEGATIVE
Protein Ur, POC: 30 mg/dL — AB
Spec Grav, UA: 1.025 (ref 1.010–1.025)
Urobilinogen, UA: 0.2 E.U./dL
pH, UA: 6 (ref 5.0–8.0)

## 2023-01-09 ENCOUNTER — Other Ambulatory Visit: Payer: Self-pay | Admitting: Family Medicine

## 2023-01-09 DIAGNOSIS — I480 Paroxysmal atrial fibrillation: Secondary | ICD-10-CM

## 2023-01-09 LAB — URINE CULTURE
MICRO NUMBER:: 14861856
SPECIMEN QUALITY:: ADEQUATE

## 2023-01-10 ENCOUNTER — Encounter: Payer: Self-pay | Admitting: Family Medicine

## 2023-01-10 ENCOUNTER — Other Ambulatory Visit: Payer: Self-pay | Admitting: Family Medicine

## 2023-01-10 DIAGNOSIS — R109 Unspecified abdominal pain: Secondary | ICD-10-CM

## 2023-01-10 DIAGNOSIS — R310 Gross hematuria: Secondary | ICD-10-CM

## 2023-01-10 NOTE — Addendum Note (Signed)
Addended by: Abbe Amsterdam C on: 01/10/2023 01:21 PM   Modules accepted: Orders

## 2023-01-11 ENCOUNTER — Encounter: Payer: Self-pay | Admitting: Family Medicine

## 2023-01-12 ENCOUNTER — Ambulatory Visit (HOSPITAL_BASED_OUTPATIENT_CLINIC_OR_DEPARTMENT_OTHER)
Admission: RE | Admit: 2023-01-12 | Discharge: 2023-01-12 | Disposition: A | Discharge status: Home / Self Care | Payer: Medicare Other | Source: Ambulatory Visit | Attending: Family Medicine | Admitting: Family Medicine

## 2023-01-12 DIAGNOSIS — R109 Unspecified abdominal pain: Secondary | ICD-10-CM

## 2023-01-12 MED ORDER — IOHEXOL 300 MG/ML  SOLN
80.0000 mL | Freq: Once | INTRAMUSCULAR | Status: AC | PRN
  Administered 2023-01-12: 80 mL via INTRAVENOUS

## 2023-01-13 ENCOUNTER — Encounter: Payer: Self-pay | Admitting: Family Medicine

## 2023-01-13 DIAGNOSIS — D649 Anemia, unspecified: Secondary | ICD-10-CM

## 2023-01-15 ENCOUNTER — Encounter: Payer: Self-pay | Admitting: Family Medicine

## 2023-01-16 ENCOUNTER — Ambulatory Visit (INDEPENDENT_AMBULATORY_CARE_PROVIDER_SITE_OTHER): Payer: Medicare Other | Admitting: Urology

## 2023-01-16 ENCOUNTER — Encounter: Payer: Self-pay | Admitting: Urology

## 2023-01-16 ENCOUNTER — Other Ambulatory Visit (INDEPENDENT_AMBULATORY_CARE_PROVIDER_SITE_OTHER): Payer: Medicare Other

## 2023-01-16 VITALS — BP 108/73 | HR 94 | Ht 69.0 in | Wt 175.0 lb

## 2023-01-16 DIAGNOSIS — N201 Calculus of ureter: Secondary | ICD-10-CM | POA: Diagnosis not present

## 2023-01-16 DIAGNOSIS — D649 Anemia, unspecified: Secondary | ICD-10-CM | POA: Diagnosis not present

## 2023-01-16 DIAGNOSIS — R748 Abnormal levels of other serum enzymes: Secondary | ICD-10-CM | POA: Diagnosis not present

## 2023-01-16 DIAGNOSIS — R3129 Other microscopic hematuria: Secondary | ICD-10-CM | POA: Insufficient documentation

## 2023-01-16 DIAGNOSIS — R310 Gross hematuria: Secondary | ICD-10-CM | POA: Insufficient documentation

## 2023-01-16 LAB — MICROSCOPIC EXAMINATION
Crystal Type: NONE SEEN
Crystals: NONE SEEN
RBC, Urine: >30 /hpf — AB (ref 0–2)
Renal Epithel, UA: NONE SEEN /hpf
Trichomonas, UA: NONE SEEN
Yeast, UA: NONE SEEN

## 2023-01-16 LAB — URINALYSIS, ROUTINE W REFLEX MICROSCOPIC
Bilirubin, UA: NEGATIVE
Glucose, UA: NEGATIVE
Ketones, UA: NEGATIVE
Nitrite, UA: NEGATIVE
Protein,UA: NEGATIVE
Specific Gravity, UA: 1.03 (ref 1.005–1.030)
Urobilinogen, Ur: 0.2 mg/dL (ref 0.2–1.0)
pH, UA: 6 (ref 5.0–7.5)

## 2023-01-16 LAB — HEPATIC FUNCTION PANEL
ALT: 11 U/L (ref 0–53)
AST: 16 U/L (ref 0–37)
Albumin: 4.2 g/dL (ref 3.5–5.2)
Alkaline Phosphatase: 143 U/L — ABNORMAL HIGH (ref 39–117)
Bilirubin, Direct: 0.1 mg/dL (ref 0.0–0.3)
Total Bilirubin: 0.4 mg/dL (ref 0.2–1.2)
Total Protein: 6.9 g/dL (ref 6.0–8.3)

## 2023-01-16 LAB — CBC
HCT: 41.9 % (ref 39.0–52.0)
Hemoglobin: 14.1 g/dL (ref 13.0–17.0)
MCHC: 33.6 g/dL (ref 30.0–36.0)
MCV: 89.7 fl (ref 78.0–100.0)
Platelets: 211 10*3/uL (ref 150.0–400.0)
RBC: 4.67 Mil/uL (ref 4.22–5.81)
RDW: 14.5 % (ref 11.5–15.5)
WBC: 5.3 10*3/uL (ref 4.0–10.5)

## 2023-01-16 MED ORDER — TAMSULOSIN HCL 0.4 MG PO CAPS
0.4000 mg | ORAL_CAPSULE | Freq: Every day | ORAL | 1 refills | Status: DC
Start: 2023-01-16 — End: 2023-05-23

## 2023-01-16 NOTE — Progress Notes (Signed)
Assessment: 1. Microscopic hematuria   2. Gross hematuria   3. Ureteral calculus, left     Plan: I personally reviewed the patient's chart including provider notes, lab and imaging results. I personally viewed the CT study from 01/12/2023.  It appears that there is a small 2 mm calculus in the proximal left ureter without significant obstruction. I discussed the CT with radiology who agrees with my assessment.  They will addend the CT report. I discussed these results with the patient.  Today I had a discussion with the patient regarding the findings of gross hematuria including the implications and differential diagnoses associated with it.  I also discussed recommendations for further evaluation including the rationale for upper tract imaging and cystoscopy.  I discussed the nature of these procedures including potential risk and complications.  The patient expressed an understanding of these issues. Schedule for cystoscopy in 2-3 weeks Strain urine Begin tamsulosin for MET   Chief Complaint:  Chief Complaint  Patient presents with   Hematuria    History of Present Illness:  Brian [name] is a 72 y.o. male who is seen in consultation from Copland, Gwenlyn Found, MD for evaluation of gross and microscopic hematuria.  He has recently noted intermittent gross hematuria.  He reports that his urine is intermittently brown in color.  This occurs every 2-3 days.  No associated flank pain or dysuria.  No prior episodes of gross hematuria. Recent dipstick urinalysis showed large blood and small leukocyte esterase.  Urine culture grew <10K colonies.  He was evaluated with a CT abdomen and pelvis with contrast on 01/12/2023 which showed no renal or ureteral calculi, no renal mass, no evidence of obstruction.  He has a remote history of kidney stones with 2 stone episodes in the early 1980s. No history of tobacco use.  He has a family history of kidney and prostate cancer with his  father.   Past Medical History:  Past Medical History:  Diagnosis Date   Depression    Duodenitis 10/10/2015   PAF (paroxysmal atrial fibrillation) ([facility]) Jan 2017   lone AF in setting of sepsis   Sepsis secondary to UTI ([facility]) 10/10/2015    Past Surgical History:  Past Surgical History:  Procedure Laterality Date   APPENDECTOMY     ATRIAL FIBRILLATION ABLATION N/A 05/08/2022   Procedure: ATRIAL FIBRILLATION ABLATION;  Surgeon: Regan Lemming, MD;  Location: [facility];  Service: Cardiovascular;  Laterality: N/A;   CARDIOVASCULAR STRESS TEST  01/22/2014   GXT - Ex 10 min (10.9 METs), HR 164 = 103% MPHR. No ST-T wave chagnes or Sx.  LOW RISK  --[facility] TM Score 10    HOLTER MONITOR  10/2015   Mostly normal sinus rhythm. Recommended avoiding stimulants, energy drinks and decongestants. No signs of A. fib.Marland Kitchen PVCs noted in pairs, singles, bigeminy and trigeminy. No A. fib.   TRANSTHORACIC ECHOCARDIOGRAM  02/2014   Normal LV size and function.  EF 68%.  Mild MR.  Mild TR.  No pulmonary hypertension.  Blood normal.   VASECTOMY      Allergies:  Allergies  Allergen Reactions   Sulfa Antibiotics     childhood    Family History:  Family History  Problem Relation Age of Onset   Cancer Mother    Cancer Father    Heart disease Father    Stroke Father    Cancer Maternal Grandmother    Stroke Maternal Grandfather    Coronary artery disease Other  Cancer Other    Stroke Other    Arrhythmia Other     Social History:  Social History   Tobacco Use   Smoking status: Never   Smokeless tobacco: Never   Tobacco comments:    Never smoke 02/22/22  Vaping Use   Vaping Use: Never used  Substance Use Topics   Alcohol use: No    Alcohol/week: 0.0 standard drinks of alcohol   Drug use: No    Review of symptoms:  Constitutional:  Negative for unexplained weight loss, night sweats, fever, chills ENT:  Negative for nose bleeds, sinus pain, painful swallowing CV:  Negative for  chest pain, shortness of breath, exercise intolerance, palpitations, loss of consciousness Resp:  Negative for cough, wheezing, shortness of breath GI:  Negative for nausea, vomiting, diarrhea, bloody stools GU:  Positives noted in HPI; otherwise negative for dysuria, urinary incontinence Neuro:  Negative for seizures, poor balance, limb weakness, slurred speech Psych:  Negative for lack of energy, depression, anxiety Endocrine:  Negative for polydipsia, polyuria, symptoms of hypoglycemia (dizziness, hunger, sweating) Hematologic:  Negative for anemia, purpura, petechia, prolonged or excessive bleeding, use of anticoagulants  Allergic:  Negative for difficulty breathing or choking as a result of exposure to anything; no shellfish allergy; no allergic response (rash/itch) to materials, foods  Physical exam: BP 108/73   Pulse 94   Ht 5\' 9"  (1.753 m)   Wt 175 lb (79.4 kg)   BMI 25.84 kg/m  GENERAL APPEARANCE:  Well appearing, well developed, well nourished, NAD HEENT: Atraumatic, Normocephalic, oropharynx clear. NECK: Supple without lymphadenopathy or thyromegaly. LUNGS: Clear to auscultation bilaterally. HEART: Regular Rate and Rhythm without murmurs, gallops, or rubs. ABDOMEN: Soft, non-tender, No Masses. EXTREMITIES: Moves all extremities well.  Without clubbing, cyanosis, or edema. NEUROLOGIC:  Alert and oriented x 3, normal gait, CN II-XII grossly intact.  MENTAL STATUS:  Appropriate. BACK:  Non-tender to palpation.  No CVAT SKIN:  Warm, dry and intact.   GU: Penis:  circumcised Meatus: Normal Scrotum: normal, no masses Testis: normal without masses bilateral Epididymis: normal Prostate: 40 g, NT, no nodules Rectum: Normal tone,  no masses or tenderness   Results: U/A:  >30 RBC, 0-5 WBC, few bacteria

## 2023-01-17 ENCOUNTER — Encounter: Payer: Self-pay | Admitting: Family Medicine

## 2023-01-17 DIAGNOSIS — G4700 Insomnia, unspecified: Secondary | ICD-10-CM

## 2023-01-18 MED ORDER — MIRTAZAPINE 30 MG PO TABS
30.0000 mg | ORAL_TABLET | Freq: Every day | ORAL | 3 refills | Status: DC
Start: 2023-01-18 — End: 2023-10-21

## 2023-02-05 ENCOUNTER — Encounter: Payer: Self-pay | Admitting: Urology

## 2023-02-05 ENCOUNTER — Ambulatory Visit (INDEPENDENT_AMBULATORY_CARE_PROVIDER_SITE_OTHER): Payer: Medicare Other | Admitting: Urology

## 2023-02-05 VITALS — BP 111/74 | HR 85 | Ht 69.0 in | Wt 170.0 lb

## 2023-02-05 DIAGNOSIS — R3129 Other microscopic hematuria: Secondary | ICD-10-CM | POA: Diagnosis not present

## 2023-02-05 DIAGNOSIS — N201 Calculus of ureter: Secondary | ICD-10-CM | POA: Diagnosis not present

## 2023-02-05 DIAGNOSIS — R310 Gross hematuria: Secondary | ICD-10-CM | POA: Diagnosis not present

## 2023-02-05 DIAGNOSIS — Z87898 Personal history of other specified conditions: Secondary | ICD-10-CM

## 2023-02-05 LAB — MICROSCOPIC EXAMINATION
Cast Type: NONE SEEN
Casts: NONE SEEN /lpf
Crystal Type: NONE SEEN
Crystals: NONE SEEN
Epithelial Cells (non renal): NONE SEEN /hpf (ref 0–10)
RBC, Urine: NONE SEEN /hpf (ref 0–2)
Renal Epithel, UA: NONE SEEN /hpf
Trichomonas, UA: NONE SEEN
Yeast, UA: NONE SEEN

## 2023-02-05 LAB — URINALYSIS, ROUTINE W REFLEX MICROSCOPIC
Bilirubin, UA: NEGATIVE
Glucose, UA: NEGATIVE
Ketones, UA: NEGATIVE
Nitrite, UA: NEGATIVE
Protein,UA: NEGATIVE
RBC, UA: NEGATIVE
Specific Gravity, UA: 1.02 (ref 1.005–1.030)
Urobilinogen, Ur: 0.2 mg/dL (ref 0.2–1.0)
pH, UA: 7 (ref 5.0–7.5)

## 2023-02-05 NOTE — Progress Notes (Signed)
Assessment: 1. Microscopic hematuria   2. History of gross hematuria   3. Ureteral calculus, left     Plan: His urinalysis is negative for blood today. I discussed the possible association of the ureteral calculus and his hematuria.  I also discussed the role of cystoscopy and evaluation of hematuria.  He would like to hold off at the present time and follow-up for a repeat urinalysis in the near future.  I think this is reasonable. Return to office in 6 weeks for urinalysis.  Will contact him with results.  Chief Complaint:  Chief Complaint  Patient presents with   Cysto    History of Present Illness:  Brian [name] is a 72 y.o. male who is seen for further evaluation of gross and microscopic hematuria.  He had recently noted intermittent gross hematuria.  He reported that his urine was intermittently brown in color, occurring every 2-3 days.  No associated flank pain or dysuria.  No prior episodes of gross hematuria. Recent dipstick urinalysis showed large blood and small leukocyte esterase.  Urine culture grew <10K colonies.  He was evaluated with a CT abdomen and pelvis with contrast on 01/12/2023 which showed no renal or ureteral calculi, no renal mass, no evidence of obstruction.  He has a remote history of kidney stones with 2 stone episodes in the early 1980s. No history of tobacco use.  He has a family history of kidney and prostate cancer with his father. Urinalysis from 01/16/2023 showed >30 RBCs. Review of the CT scan from 01/12/2023 showed a 2 mm calculus in the proximal left ureter without obstruction.  He presents today for further evaluation of the hematuria with cystoscopy. He reports passing a stone approximately 10 days ago.  He saw the stone in the toilet but was unable to retrieve it.  Since that time, he has had no further symptoms of gross hematuria.  No flank pain.  No dysuria.  Portions of the above documentation were copied from a prior visit for review  purposes only.   Past Medical History:  Past Medical History:  Diagnosis Date   Depression    Duodenitis 10/10/2015   PAF (paroxysmal atrial fibrillation) (HCC) Jan 2017   lone AF in setting of sepsis   Sepsis secondary to UTI (HCC) 10/10/2015    Past Surgical History:  Past Surgical History:  Procedure Laterality Date   APPENDECTOMY     ATRIAL FIBRILLATION ABLATION N/A 05/08/2022   Procedure: ATRIAL FIBRILLATION ABLATION;  Surgeon: Regan Lemming, MD;  Location: [facility];  Service: Cardiovascular;  Laterality: N/A;   CARDIOVASCULAR STRESS TEST  01/22/2014   GXT - Ex 10 min (10.9 METs), HR 164 = 103% MPHR. No ST-T wave chagnes or Sx.  LOW RISK  --[facility] TM Score 10    HOLTER MONITOR  10/2015   Mostly normal sinus rhythm. Recommended avoiding stimulants, energy drinks and decongestants. No signs of A. fib.Marland Kitchen PVCs noted in pairs, singles, bigeminy and trigeminy. No A. fib.   TRANSTHORACIC ECHOCARDIOGRAM  02/2014   Normal LV size and function.  EF 68%.  Mild MR.  Mild TR.  No pulmonary hypertension.  Blood normal.   VASECTOMY      Allergies:  Allergies  Allergen Reactions   Sulfa Antibiotics     childhood    Family History:  Family History  Problem Relation Age of Onset   Cancer Mother    Cancer Father    Heart disease Father    Stroke Father  Cancer Maternal Grandmother    Stroke Maternal Grandfather    Coronary artery disease Other    Cancer Other    Stroke Other    Arrhythmia Other     Social History:  Social History   Tobacco Use   Smoking status: Never   Smokeless tobacco: Never   Tobacco comments:    Never smoke 02/22/22  Vaping Use   Vaping Use: Never used  Substance Use Topics   Alcohol use: No    Alcohol/week: 0.0 standard drinks of alcohol   Drug use: No    ROS: Constitutional:  Negative for fever, chills, weight loss CV: Negative for chest pain, previous MI, hypertension Respiratory:  Negative for shortness of breath, wheezing,  sleep apnea, frequent cough GI:  Negative for nausea, vomiting, bloody stool, GERD  Physical exam: BP 111/74   Pulse 85   Ht 5\' 9"  (1.753 m)   Wt 170 lb (77.1 kg)   BMI 25.10 kg/m  GENERAL APPEARANCE:  Well appearing, well developed, well nourished, NAD HEENT:  Atraumatic, normocephalic, oropharynx clear NECK:  Supple without lymphadenopathy or thyromegaly ABDOMEN:  Soft, non-tender, no masses EXTREMITIES:  Moves all extremities well, without clubbing, cyanosis, or edema NEUROLOGIC:  Alert and oriented x 3, normal gait, CN II-XII grossly intact MENTAL STATUS:  appropriate BACK:  Non-tender to palpation, No CVAT SKIN:  Warm, dry, and intact   Results: U/A:  0-5 WBC, mod bacteria

## 2023-03-18 ENCOUNTER — Other Ambulatory Visit: Payer: Self-pay

## 2023-03-18 DIAGNOSIS — R3129 Other microscopic hematuria: Secondary | ICD-10-CM

## 2023-03-19 ENCOUNTER — Other Ambulatory Visit: Payer: Medicare Other

## 2023-03-19 ENCOUNTER — Encounter: Payer: Self-pay | Admitting: Urology

## 2023-03-19 ENCOUNTER — Other Ambulatory Visit: Payer: Self-pay

## 2023-03-19 DIAGNOSIS — R3129 Other microscopic hematuria: Secondary | ICD-10-CM

## 2023-03-19 LAB — MICROSCOPIC EXAMINATION
Cast Type: NONE SEEN
Casts: NONE SEEN /lpf
Epithelial Cells (non renal): NONE SEEN /hpf (ref 0–10)
Renal Epithel, UA: NONE SEEN /hpf
Trichomonas, UA: NONE SEEN
Yeast, UA: NONE SEEN

## 2023-03-19 LAB — URINALYSIS, ROUTINE W REFLEX MICROSCOPIC
Bilirubin, UA: NEGATIVE
Glucose, UA: NEGATIVE
Ketones, UA: NEGATIVE
Nitrite, UA: NEGATIVE
RBC, UA: NEGATIVE
Specific Gravity, UA: 1.025 (ref 1.005–1.030)
Urobilinogen, Ur: 0.2 mg/dL (ref 0.2–1.0)
pH, UA: 7 (ref 5.0–7.5)

## 2023-04-08 ENCOUNTER — Other Ambulatory Visit: Payer: Self-pay | Admitting: Family Medicine

## 2023-04-08 DIAGNOSIS — F32A Depression, unspecified: Secondary | ICD-10-CM

## 2023-05-10 ENCOUNTER — Telehealth: Payer: Self-pay | Admitting: Family Medicine

## 2023-05-10 NOTE — Telephone Encounter (Signed)
Copied from CRM [id number]. Topic: Medicare AWV >> May 10, 2023 11:36 AM Payton Doughty wrote: Reason for CRM: LM 05/10/2023  Brian [name]; Care Guide Ambulatory Clinical Support [place] [facility] Direct Dial: [phone]

## 2023-05-21 NOTE — Progress Notes (Unsigned)
[facility] [address] [place], [place] [address] [phone]  Date:  05/23/2023   Name:  Brian [name]   DOB:  1951/08/15   MRN:  [id number]  PCP:  [name], MD    Chief Complaint: Rash on leg (Lower left leg/Concerns/ questions: asks if he can have his labs done today as well/(AWV and Flu shot due))   History of Present Illness:  Brian [name] is a 72 y.o. very pleasant male patient who presents with the following:  Patient seen today with concern of a rash mostly on his left leg Most recent visit with myself was in April- history of A fib, dyslipidemia and depression, prediabetes   He had an ablation for his A-fib last year  He did have an episode of hematuria since our last visit has been evaluated by urology-they ended up finding a small left ureteral stone. 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  Then seemed to spread and got larger.  He then developed some satellite lesions on the left leg He saw his Derm in July-it sounds like at that time they thought he might have impetigo, prescribed mupirocin also doxycycline.  He is still using both of these, taking doxycycline 100 mg twice a day  He followed up with derm in August-they did not make any changes Patient notes his rash does not seem to be responding to his current treatment, he wonders if this might not be impetigo and if some other treatment might be more helpful  He has gotten a few more satellite lesions on the left leg, and one on the right leg over the last few months He otherwise feels  fine  Patient Active Problem List   Diagnosis Date Noted   Ureteral calculus, left 01/16/2023   Microscopic hematuria 01/16/2023   Gross hematuria 01/16/2023   Secondary hypercoagulable state (HCC) 06/05/2022   History of heat exhaustion 06/19/2020   Exercise intolerance 06/19/2020   Prediabetes 05/28/2020   BMI 25.0-25.9,adult 11/11/2015   Family history of coronary artery disease in father 10/25/2015   Paroxysmal atrial fibrillation (HCC) 10/11/2015   Dyslipidemia 10/10/2015   Depression     Past Medical History:  Diagnosis Date   Depression    Duodenitis 10/10/2015   PAF (paroxysmal atrial fibrillation) (HCC) Jan 2017   lone AF in setting of sepsis   Sepsis secondary to UTI (HCC) 10/10/2015    Past Surgical History:  Procedure Laterality Date   APPENDECTOMY     ATRIAL FIBRILLATION ABLATION N/A 05/08/2022   Procedure: ATRIAL FIBRILLATION ABLATION;  Surgeon: Regan Lemming, MD;  Location: [facility];  Service: Cardiovascular;  Laterality: N/A;   CARDIOVASCULAR STRESS TEST  01/22/2014   GXT - Ex 10 min (10.9 METs), HR 164 = 103% MPHR. No ST-T wave chagnes or Sx.  LOW RISK  --[facility] TM Score 10    HOLTER MONITOR  10/2015   Mostly normal sinus rhythm. Recommended avoiding stimulants, energy drinks and decongestants. No signs of A. fib.Marland Kitchen PVCs  noted in pairs, singles, bigeminy and trigeminy. No A. fib.   TRANSTHORACIC ECHOCARDIOGRAM  02/2014   Normal LV size and function.  EF 68%.  Mild MR.  Mild TR.  No pulmonary hypertension.  Blood normal.   VASECTOMY      Social History   Tobacco Use   Smoking status: Never   Smokeless tobacco: Never   Tobacco comments:    Never smoke 02/22/22  Vaping Use   Vaping status: Never Used  Substance Use Topics   Alcohol use: No    Alcohol/week: 0.0 standard drinks of alcohol   Drug use: No    Family History  Problem Relation Age of Onset   Cancer Mother    Cancer Father    Heart disease Father    Stroke Father    Cancer  Maternal Grandmother    Stroke Maternal Grandfather    Coronary artery disease Other    Cancer Other    Stroke Other    Arrhythmia Other     Allergies  Allergen Reactions   Sulfa Antibiotics     childhood    Medication list has been reviewed and updated.  Current Outpatient Medications on File Prior to Visit  Medication Sig Dispense Refill   atorvastatin (LIPITOR) 10 MG tablet Take 1 tablet (10 mg total) by mouth daily. 90 tablet 3   diltiazem (CARDIZEM CD) 360 MG 24 hr capsule TAKE 1 CAPSULE BY MOUTH DAILY 30 capsule 5   mirtazapine (REMERON) 30 MG tablet Take 1 tablet (30 mg total) by mouth at bedtime. 90 tablet 3   Multiple Vitamin (MULTIVITAMIN) tablet Take 1 tablet by mouth daily.     PARoxetine (PAXIL) 30 MG tablet TAKE 1 TABLET BY MOUTH EVERY DAY 90 tablet 1   No current facility-administered medications on file prior to visit.    Review of Systems:  As per HPI- otherwise negative.   Physical Examination: Vitals:   05/23/23 0926  BP: 110/62  Pulse: 95  Resp: 18  Temp: 98 F (36.7 C)  SpO2: 95%   Vitals:   05/23/23 0926  Weight: 173 lb 3.2 oz (78.6 kg)  Height: 5\' 8"  (1.727 m)   Body mass index is 26.33 kg/m. Ideal Body Weight: Weight in (lb) to have BMI = 25: 164.1  GEN: no acute distress.  Normal weight, looks well HEENT: Atraumatic, Normocephalic.  Ears and Nose: No external deformity. CV: RRR, No M/G/R. No JVD. No thrill. No extra heart sounds. PULM: CTA B, no wheezes, crackles, rhonchi. No retractions. No resp. distress. No accessory muscle use. EXTR: No c/c/e PSYCH: Normally interactive. Conversant.  He has scaly, well-circumscribed patches as pictured on his leg below.  They are in various states of development.  The ones on the left ankle appear to be the newest, on the left anterior shin currently resolving Appear most consistent with psoriasis or perhaps eczema      Assessment and Plan: Skin lesion - Plan: Ambulatory referral to  Dermatology, triamcinolone ointment (KENALOG) 0.5 %  Elevated alkaline phosphatase level - Plan: Comprehensive metabolic panel  Prediabetes - Plan: Hemoglobin A1c  Dyslipidemia  Screening, deficiency anemia, iron - Plan: CBC  Patient seen today with a skin rash.  He is being treated for possible impetigo with mupirocin ointment and doxycycline.  However, he has been on this treatment for about 2 months now and has not seen improvement, in fact the rash seems to be spreading.  On exam today I am suspicious this may be psoriasis  or eczema instead.  I asked him to try triamcinolone and let me know how this works for him.  We can do a biopsy here in the office if it does not improve.  He would like to seek a second dermatology opinion as well and I have placed a referral  Will also get some routine lab work today  Signed Abbe Amsterdam, MD  Received labs as below, message to patient  Results for orders placed or performed in visit on 05/23/23  CBC  Result Value Ref Range   WBC 5.5 4.0 - 10.5 K/uL   RBC 5.11 4.22 - 5.81 Mil/uL   Platelets 207.0 150.0 - 400.0 K/uL   Hemoglobin 14.6 13.0 - 17.0 g/dL   HCT 16.1 09.6 - 04.5 %   MCV 88.7 78.0 - 100.0 fl   MCHC 32.3 30.0 - 36.0 g/dL   RDW 40.9 81.1 - 91.4 %  Comprehensive metabolic panel  Result Value Ref Range   Sodium 141 135 - 145 mEq/L   Potassium 4.0 3.5 - 5.1 mEq/L   Chloride 105 96 - 112 mEq/L   CO2 29 19 - 32 mEq/L   Glucose, Bld 91 70 - 99 mg/dL   BUN 19 6 - 23 mg/dL   Creatinine, Ser 7.82 0.40 - 1.50 mg/dL   Total Bilirubin 0.5 0.2 - 1.2 mg/dL   Alkaline Phosphatase 143 (H) 39 - 117 U/L   AST 17 0 - 37 U/L   ALT 14 0 - 53 U/L   Total Protein 6.8 6.0 - 8.3 g/dL   Albumin 4.1 3.5 - 5.2 g/dL   GFR 95.62 >13.08 mL/min   Calcium 9.7 8.4 - 10.5 mg/dL  Hemoglobin M5H  Result Value Ref Range   Hgb A1c MFr Bld 6.5 4.6 - 6.5 %

## 2023-05-23 ENCOUNTER — Encounter: Payer: Self-pay | Admitting: Family Medicine

## 2023-05-23 ENCOUNTER — Ambulatory Visit (INDEPENDENT_AMBULATORY_CARE_PROVIDER_SITE_OTHER): Payer: Medicare Other | Admitting: Family Medicine

## 2023-05-23 VITALS — BP 110/62 | HR 95 | Temp 98.0°F | Resp 18 | Ht 68.0 in | Wt 173.2 lb

## 2023-05-23 DIAGNOSIS — R7303 Prediabetes: Secondary | ICD-10-CM

## 2023-05-23 DIAGNOSIS — Z130 Encounter for screening for diseases of the blood and blood-forming organs and certain disorders involving the immune mechanism: Secondary | ICD-10-CM | POA: Diagnosis not present

## 2023-05-23 DIAGNOSIS — E785 Hyperlipidemia, unspecified: Secondary | ICD-10-CM | POA: Diagnosis not present

## 2023-05-23 DIAGNOSIS — L989 Disorder of the skin and subcutaneous tissue, unspecified: Secondary | ICD-10-CM

## 2023-05-23 DIAGNOSIS — R748 Abnormal levels of other serum enzymes: Secondary | ICD-10-CM

## 2023-05-23 LAB — COMPREHENSIVE METABOLIC PANEL
ALT: 14 U/L (ref 0–53)
AST: 17 U/L (ref 0–37)
Albumin: 4.1 g/dL (ref 3.5–5.2)
Alkaline Phosphatase: 143 U/L — ABNORMAL HIGH (ref 39–117)
BUN: 19 mg/dL (ref 6–23)
CO2: 29 meq/L (ref 19–32)
Calcium: 9.7 mg/dL (ref 8.4–10.5)
Chloride: 105 meq/L (ref 96–112)
Creatinine, Ser: 1.06 mg/dL (ref 0.40–1.50)
GFR: 71.11 mL/min (ref >60.00)
Glucose, Bld: 91 mg/dL (ref 70–99)
Potassium: 4 meq/L (ref 3.5–5.1)
Sodium: 141 meq/L (ref 135–145)
Total Bilirubin: 0.5 mg/dL (ref 0.2–1.2)
Total Protein: 6.8 g/dL (ref 6.0–8.3)

## 2023-05-23 LAB — CBC
HCT: 45.3 % (ref 39.0–52.0)
Hemoglobin: 14.6 g/dL (ref 13.0–17.0)
MCHC: 32.3 g/dL (ref 30.0–36.0)
MCV: 88.7 fl (ref 78.0–100.0)
Platelets: 207 10*3/uL (ref 150.0–400.0)
RBC: 5.11 Mil/uL (ref 4.22–5.81)
RDW: 15.3 % (ref 11.5–15.5)
WBC: 5.5 10*3/uL (ref 4.0–10.5)

## 2023-05-23 LAB — HEMOGLOBIN A1C: Hgb A1c MFr Bld: 6.5 % (ref 4.6–6.5)

## 2023-05-23 MED ORDER — TRIAMCINOLONE ACETONIDE 0.5 % EX OINT
1.0000 | TOPICAL_OINTMENT | Freq: Two times a day (BID) | CUTANEOUS | 0 refills | Status: DC
Start: 2023-05-23 — End: 2023-10-02

## 2023-05-23 NOTE — Patient Instructions (Addendum)
Good to see you- I will be in touch with your labs asap Try the triamcinolone ointment on the skin lesions on your leg- let me know how this seems to work for you   If not getting better I can do a biopsy for you

## 2023-05-29 ENCOUNTER — Other Ambulatory Visit: Payer: Self-pay | Admitting: *Deleted

## 2023-05-29 DIAGNOSIS — R748 Abnormal levels of other serum enzymes: Secondary | ICD-10-CM

## 2023-05-29 NOTE — Addendum Note (Signed)
Addended by: Mervin Kung A on: 05/29/2023 04:16 PM   Modules accepted: Orders

## 2023-05-30 ENCOUNTER — Ambulatory Visit: Payer: Medicare Other

## 2023-05-30 DIAGNOSIS — R748 Abnormal levels of other serum enzymes: Secondary | ICD-10-CM

## 2023-05-31 ENCOUNTER — Encounter: Payer: Self-pay | Admitting: Family Medicine

## 2023-05-31 DIAGNOSIS — R748 Abnormal levels of other serum enzymes: Secondary | ICD-10-CM

## 2023-05-31 LAB — GAMMA GT: GGT: 21 U/L (ref 3–70)

## 2023-06-18 NOTE — Telephone Encounter (Signed)
Called and left message on machine with patient, asked him to please check his MyChart messages.  If he is not able to do so please call

## 2023-06-18 NOTE — Addendum Note (Signed)
Addended by: Abbe Amsterdam C on: 06/18/2023 07:25 PM   Modules accepted: Orders

## 2023-07-08 ENCOUNTER — Encounter: Payer: Self-pay | Admitting: Family Medicine

## 2023-07-08 ENCOUNTER — Other Ambulatory Visit: Payer: Self-pay | Admitting: Family Medicine

## 2023-07-08 DIAGNOSIS — I480 Paroxysmal atrial fibrillation: Secondary | ICD-10-CM

## 2023-07-08 DIAGNOSIS — R748 Abnormal levels of other serum enzymes: Secondary | ICD-10-CM

## 2023-07-09 NOTE — Addendum Note (Signed)
Addended by: [name] on: 07/09/2023 06:28 AM   Modules accepted: Orders

## 2023-07-12 ENCOUNTER — Other Ambulatory Visit (INDEPENDENT_AMBULATORY_CARE_PROVIDER_SITE_OTHER): Payer: Medicare Other

## 2023-07-12 DIAGNOSIS — R748 Abnormal levels of other serum enzymes: Secondary | ICD-10-CM

## 2023-07-12 LAB — PSA: PSA: 4.2 ng/mL — ABNORMAL HIGH (ref 0.10–4.00)

## 2023-07-12 LAB — VITAMIN D 25 HYDROXY (VIT D DEFICIENCY, FRACTURES): VITD: 27.13 ng/mL — ABNORMAL LOW (ref 30.00–100.00)

## 2023-07-13 ENCOUNTER — Encounter: Payer: Self-pay | Admitting: Family Medicine

## 2023-07-13 ENCOUNTER — Other Ambulatory Visit: Payer: Self-pay | Admitting: Family Medicine

## 2023-07-13 DIAGNOSIS — R972 Elevated prostate specific antigen [PSA]: Secondary | ICD-10-CM

## 2023-07-16 LAB — ALKALINE PHOSPHATASE, BONE SPECIFIC: ALKALINE PHOSPHATASE, BONE SPECIFIC: 22 ug/L

## 2023-07-16 LAB — PATHOLOGIST SMEAR REVIEW

## 2023-07-18 ENCOUNTER — Encounter: Payer: Self-pay | Admitting: Family Medicine

## 2023-07-22 ENCOUNTER — Encounter: Payer: Self-pay | Admitting: Urology

## 2023-07-22 ENCOUNTER — Ambulatory Visit (INDEPENDENT_AMBULATORY_CARE_PROVIDER_SITE_OTHER): Payer: Medicare Other | Admitting: Urology

## 2023-07-22 VITALS — BP 115/76 | HR 86 | Ht 69.0 in | Wt 175.0 lb

## 2023-07-22 DIAGNOSIS — R972 Elevated prostate specific antigen [PSA]: Secondary | ICD-10-CM | POA: Diagnosis not present

## 2023-07-22 DIAGNOSIS — Z87898 Personal history of other specified conditions: Secondary | ICD-10-CM | POA: Diagnosis not present

## 2023-07-22 LAB — URINALYSIS, ROUTINE W REFLEX MICROSCOPIC
Bilirubin, UA: NEGATIVE
Glucose, UA: NEGATIVE
Ketones, UA: NEGATIVE
Nitrite, UA: NEGATIVE
Protein,UA: NEGATIVE
RBC, UA: NEGATIVE
Specific Gravity, UA: 1.02 (ref 1.005–1.030)
Urobilinogen, Ur: 0.2 mg/dL (ref 0.2–1.0)
pH, UA: 7 (ref 5.0–7.5)

## 2023-07-22 LAB — MICROSCOPIC EXAMINATION: RBC, Urine: NONE SEEN /[HPF] (ref 0–2)

## 2023-07-22 NOTE — Progress Notes (Signed)
Assessment: 1. Elevated PSA   2. History of gross hematuria     Plan: Today I had a long discussion with the patient regarding PSA and the rationale and controversies of prostate cancer early detection.  I discussed the pros and cons of further evaluation including TRUS and prostate Bx.  Potential adverse events and complications as well as standard instructions were given.  Patient expressed his understanding of these issues. Given the minimal PSA elevation, I think it is very unlikely that he would have metastatic prostate cancer with skeletal involvement as the cause for the elevated alkaline phosphatase. isoPSA sent today - will call with results.   Chief Complaint:  Chief Complaint  Patient presents with   Elevated PSA    History of Present Illness:  Brian [name] is a 72 y.o. male who is seen for evaluation of elevated PSA. He was also found to have an elevated alkaline phosphatase of 143 on routine laboratory testing in September 2024.  The bone specific alkaline phosphatase was elevated.  PSA results: 3/20 2.4 3/21 2.0 3/22 2.6 10/22 1.95 9/23 2.57 4/24 2.5 10/24 4.20  No prior history of PSA elevation.  No prior prostate biopsy.  No history of UTIs or prostatitis. CT from 4/24 showed prostate enlargement with central calcifications. He has not had any change in his urinary symptoms.  No dysuria or gross hematuria.  No bone pain or weight loss. IPSS = 6 today  He has a history of gross and microscopic hematuria.  He had a history of intermittent gross hematuria.  He reported that his urine was intermittently brown in color, occurring every 2-3 days.  No associated flank pain or dysuria.  No prior episodes of gross hematuria. Dipstick urinalysis showed large blood and small leukocyte esterase.  Urine culture grew <10K colonies.  He was evaluated with a CT abdomen and pelvis with contrast on 01/12/2023 which showed no renal or ureteral calculi, no renal mass, no  evidence of obstruction.  He has a remote history of kidney stones with 2 stone episodes in the early 1980s. No history of tobacco use.  He has a family history of kidney and prostate cancer with his father. Urinalysis from 01/16/2023 showed >30 RBCs. Review of the CT scan from 01/12/2023 showed a 2 mm calculus in the proximal left ureter without obstruction. He reported passing a stone following the CT scan.  He had no further episodes of gross hematuria.  He was scheduled to undergo cystoscopy in May 2024 but elected to postpone this evaluation. Repeat urinalysis from July 2024 did not show any evidence of hematuria.   Portions of the above documentation were copied from a prior visit for review purposes only.   Past Medical History:  Past Medical History:  Diagnosis Date   Depression    Duodenitis 10/10/2015   PAF (paroxysmal atrial fibrillation) (HCC) Jan 2017   lone AF in setting of sepsis   Sepsis secondary to UTI (HCC) 10/10/2015    Past Surgical History:  Past Surgical History:  Procedure Laterality Date   APPENDECTOMY     ATRIAL FIBRILLATION ABLATION N/A 05/08/2022   Procedure: ATRIAL FIBRILLATION ABLATION;  Surgeon: Regan Lemming, MD;  Location: [facility];  Service: Cardiovascular;  Laterality: N/A;   CARDIOVASCULAR STRESS TEST  01/22/2014   GXT - Ex 10 min (10.9 METs), HR 164 = 103% MPHR. No ST-T wave chagnes or Sx.  LOW RISK  --[facility] TM Score 10    HOLTER MONITOR  10/2015   Mostly normal sinus rhythm. Recommended avoiding stimulants, energy drinks and decongestants. No signs of A. fib.Marland Kitchen PVCs noted in pairs, singles, bigeminy and trigeminy. No A. fib.   TRANSTHORACIC ECHOCARDIOGRAM  02/2014   Normal LV size and function.  EF 68%.  Mild MR.  Mild TR.  No pulmonary hypertension.  Blood normal.   VASECTOMY      Allergies:  Allergies  Allergen Reactions   Sulfa Antibiotics     childhood    Family History:  Family History  Problem Relation Age of Onset    Cancer Mother    Cancer Father    Heart disease Father    Stroke Father    Cancer Maternal Grandmother    Stroke Maternal Grandfather    Coronary artery disease Other    Cancer Other    Stroke Other    Arrhythmia Other     Social History:  Social History   Tobacco Use   Smoking status: Never   Smokeless tobacco: Never   Tobacco comments:    Never smoke 02/22/22  Vaping Use   Vaping status: Never Used  Substance Use Topics   Alcohol use: No    Alcohol/week: 0.0 standard drinks of alcohol   Drug use: No    ROS: Constitutional:  Negative for fever, chills, weight loss CV: Negative for chest pain, previous MI, hypertension Respiratory:  Negative for shortness of breath, wheezing, sleep apnea, frequent cough GI:  Negative for nausea, vomiting, bloody stool, GERD  Physical exam: BP 115/76   Pulse 86   Ht 5\' 9"  (1.753 m)   Wt 175 lb (79.4 kg)   BMI 25.84 kg/m  GENERAL APPEARANCE:  Well appearing, well developed, well nourished, NAD HEENT:  Atraumatic, normocephalic, oropharynx clear NECK:  Supple without lymphadenopathy or thyromegaly ABDOMEN:  Soft, non-tender, no masses EXTREMITIES:  Moves all extremities well, without clubbing, cyanosis, or edema NEUROLOGIC:  Alert and oriented x 3, normal gait, CN II-XII grossly intact MENTAL STATUS:  appropriate BACK:  Non-tender to palpation, No CVAT SKIN:  Warm, dry, and intact GU: Prostate: 40 g, NT, no nodules Rectum: Normal tone,  no masses or tenderness   Results: U/A:0-5 WBC, 0 RBC

## 2023-07-29 ENCOUNTER — Encounter: Payer: Self-pay | Admitting: Urology

## 2023-07-30 ENCOUNTER — Encounter: Payer: Self-pay | Admitting: Urology

## 2023-07-31 ENCOUNTER — Telehealth: Payer: Self-pay

## 2023-07-31 ENCOUNTER — Other Ambulatory Visit: Payer: Self-pay

## 2023-07-31 DIAGNOSIS — R972 Elevated prostate specific antigen [PSA]: Secondary | ICD-10-CM

## 2023-07-31 NOTE — Telephone Encounter (Signed)
-----   Message from [facility] sent at 07/31/2023  8:19 AM EST ----- Could you contact the patient with prep instructions for a prostate biopsy and also schedule him for the biopsy?  Thanks.

## 2023-07-31 NOTE — Telephone Encounter (Signed)
Called pt to schedule, pt was not in a location where he could schedule at the moment, states he will return call to get scheduled. Advised pt I would go ahead and send biopsy instructions to his mychart. Orders placed.

## 2023-09-20 ENCOUNTER — Encounter: Payer: Self-pay | Admitting: Family Medicine

## 2023-09-27 ENCOUNTER — Encounter: Payer: Self-pay | Admitting: Endocrinology

## 2023-09-27 ENCOUNTER — Ambulatory Visit (INDEPENDENT_AMBULATORY_CARE_PROVIDER_SITE_OTHER): Payer: Medicare Other | Admitting: Endocrinology

## 2023-09-27 VITALS — BP 130/80 | HR 105 | Resp 20 | Ht 69.0 in | Wt 183.4 lb

## 2023-09-27 DIAGNOSIS — E559 Vitamin D deficiency, unspecified: Secondary | ICD-10-CM | POA: Diagnosis not present

## 2023-09-27 DIAGNOSIS — R748 Abnormal levels of other serum enzymes: Secondary | ICD-10-CM

## 2023-09-27 NOTE — Progress Notes (Signed)
 Outpatient Endocrinology Note Nixon Kolton, MD   Patient's Name: Brian [name]    DOB: [birth year]    MRN: [id number]  REASON OF VISIT: New consult for elevated alkaline phosphatase  REFERRING PROVIDER: Copland, Harlene BROCKS, MD  PCP:  Watt Harlene BROCKS, MD  HISTORY OF PRESENT ILLNESS:   Brian [name] is a 73 y.o. old male with past medical history listed below, is here for new consult for elevated alkaline phosphatase.  Pertinent history: Patient is referred for the evaluation and management of elevated alkaline phosphatase.  Patient has elevated alkaline phosphatase since April 2024, he used to have normal alkaline phosphatase prior to that.  He has normal liver enzymes with normal GGT.  Bone specific alkaline phosphatase 22 elevated.  Serum calcium , thyroid  function test are normal.  Vitamin D  is mildly low at 27.  He has mild elevation of PSA with elevation of isoPSA.  PSA was normal in April 2024 2.50 normal , elevated to 4.20 in October 2024.  He had prostate enlargement on CT scan in April 2024.  Patient was evaluated by urology due to elevated PSA and plan for prostate biopsy next week.  Patient is currently taking vitamin D3 2000 international unit daily.  Patient denies symptoms of any bony pain, back pain, joint pain or stiffness, headache or hearing loss.  No history of fractures.  No bony deformities.  He has history of knee replacement due to osteoarthritis.  Labs reviewed:   Latest Reference Range & Units 02/19/22 11:26 12/17/22 11:00 01/16/23 10:14 05/23/23 10:02  Alkaline Phosphatase 39 - 117 U/L 116 137 (H) 143 (H) 143 (H)  (H): Data is abnormally high  ALKALINE PHOSPHATASE, BONE SPECIFIC 22.0    Latest Reference Range & Units 07/12/23 09:28  VITD 30.00 - 100.00 ng/mL 27.13 (L)  (L): Data is abnormally low   Latest Reference Range & Units 06/11/22 10:28 12/17/22 11:00 07/12/23 09:28  PSA 0.10 - 4.00 ng/mL 2.57 2.50 4.20 (H)  (H): Data is abnormally  high  Iso-PSA 11.7, which resolved > 6.0   Latest Reference Range & Units 05/23/23 10:02 05/30/23 07:38  AST 0 - 37 U/L 17   ALT 0 - 53 U/L 14   GGT 3 - 70 U/L  21    Latest Reference Range & Units 12/17/22 11:00 05/23/23 10:02  Calcium  8.4 - 10.5 mg/dL 9.5 9.7  TSH 9.64 - 4.49 uIU/mL 2.62     REVIEW OF SYSTEMS:  As per history of present illness.   PAST MEDICAL HISTORY: Past Medical History:  Diagnosis Date   Depression    Duodenitis 10/10/2015   PAF (paroxysmal atrial fibrillation) (HCC) Jan 2017   lone AF in setting of sepsis   Sepsis secondary to UTI (HCC) 10/10/2015    PAST SURGICAL HISTORY: Past Surgical History:  Procedure Laterality Date   APPENDECTOMY     ATRIAL FIBRILLATION ABLATION N/A 05/08/2022   Procedure: ATRIAL FIBRILLATION ABLATION;  Surgeon: Inocencio Soyla Lunger, MD;  Location: [facility];  Service: Cardiovascular;  Laterality: N/A;   CARDIOVASCULAR STRESS TEST  01/22/2014   GXT - Ex 10 min (10.9 METs), HR 164 = 103% MPHR. No ST-T wave chagnes or Sx.  LOW RISK  --Duke TM Score 10    HOLTER MONITOR  10/2015   Mostly normal sinus rhythm. Recommended avoiding stimulants, energy drinks and decongestants. No signs of A. fib.SABRA PVCs noted in pairs, singles, bigeminy and trigeminy. No A. fib.   TRANSTHORACIC ECHOCARDIOGRAM  02/2014   Normal  LV size and function.  EF 68%.  Mild MR.  Mild TR.  No pulmonary hypertension.  Blood normal.   VASECTOMY      ALLERGIES: Allergies  Allergen Reactions   Sulfa Antibiotics     childhood    FAMILY HISTORY:  Family History  Problem Relation Age of Onset   Cancer Mother    Cancer Father    Heart disease Father    Stroke Father    Cancer Maternal Grandmother    Stroke Maternal Grandfather    Coronary artery disease Other    Cancer Other    Stroke Other    Arrhythmia Other     SOCIAL HISTORY: Social History   Socioeconomic History   Marital status: Married    Spouse name: [name] Wilczak   Number of  children: 2   Years of education: College   Highest education level: Bachelor's degree (e.g., BA, AB, BS)  Occupational History   Occupation: retired midwife  Tobacco Use   Smoking status: Never   Smokeless tobacco: Never   Tobacco comments:    Never smoke 02/22/22  Vaping Use   Vaping status: Never Used  Substance and Sexual Activity   Alcohol use: No    Alcohol/week: 0.0 standard drinks of alcohol   Drug use: No   Sexual activity: Yes    Partners: Female  Other Topics Concern   Not on file  Social History Narrative   He is married to [name] - who is a teacher, early years/pre.      He is a retired emergency planning/management officer, he has 2 school-aged grandchildren who are now 7 and 9.        He enjoys helping to take care of them, building furniture and volunteering with church and [employer].  His grandkids are doing virtual school- he and his wife are helping to do the home schooling   He is not doing as much volunteer work during the pandemic   He helps to review the [place] scout projects- this is still up and running       Exercise- silver sneakers   Left handed   Two story home   Social Drivers of Health   Financial Resource Strain: Low Risk  (05/17/2023)   Overall Financial Resource Strain (CARDIA)    Difficulty of Paying Living Expenses: Not hard at all  Food Insecurity: No Food Insecurity (05/17/2023)   Hunger Vital Sign    Worried About Running Out of Food in the Last Year: Never true    Ran Out of Food in the Last Year: Never true  Transportation Needs: No Transportation Needs (05/17/2023)   PRAPARE - Administrator, Civil Service (Medical): No    Lack of Transportation (Non-Medical): No  Physical Activity: Insufficiently Active (05/17/2023)   Exercise Vital Sign    Days of Exercise per Week: 2 days    Minutes of Exercise per Session: 20 min  Stress: No Stress Concern Present (05/17/2023)   [employer] of Occupational Health - Occupational Stress Questionnaire    Feeling  of Stress : Not at all  Social Connections: Socially Integrated (05/17/2023)   Social Connection and Isolation Panel [NHANES]    Frequency of Communication with Friends and Family: More than three times a week    Frequency of Social Gatherings with Friends and Family: Three times a week    Attends Religious Services: More than 4 times per year    Active Member of Clubs or Organizations: Yes  Attends Banker Meetings: More than 4 times per year    Marital Status: Married    MEDICATIONS:  Current Outpatient Medications  Medication Sig Dispense Refill   Cholecalciferol (VITAMIN D3) 50 MCG (2000 UT) capsule Take 2,000 Units by mouth daily.     atorvastatin  (LIPITOR) 10 MG tablet Take 1 tablet (10 mg total) by mouth daily. 90 tablet 3   diltiazem  (TIAZAC ) 360 MG 24 hr capsule Take 1 capsule (360 mg total) by mouth daily. 90 capsule 1   mirtazapine  (REMERON ) 30 MG tablet Take 1 tablet (30 mg total) by mouth at bedtime. 90 tablet 3   Multiple Vitamin (MULTIVITAMIN) tablet Take 1 tablet by mouth daily.     PARoxetine  (PAXIL ) 30 MG tablet TAKE 1 TABLET BY MOUTH EVERY DAY 90 tablet 1   triamcinolone  ointment (KENALOG ) 0.5 % Apply 1 Application topically 2 (two) times daily. (Patient not taking: Reported on 09/27/2023) 30 g 0   No current facility-administered medications for this visit.    PHYSICAL EXAM: Vitals:   09/27/23 0855  BP: 130/80  Pulse: (!) 105  Resp: 20  SpO2: 97%  Weight: 183 lb 6.4 oz (83.2 kg)  Height: 5' 9 (1.753 m)   Body mass index is 27.08 kg/m.  Wt Readings from Last 3 Encounters:  09/27/23 183 lb 6.4 oz (83.2 kg)  07/22/23 175 lb (79.4 kg)  05/23/23 173 lb 3.2 oz (78.6 kg)       General: Well developed, well nourished male in no apparent distress. Appropriate for age.  HEENT: AT/[facility], no external lesions. Hearing intact to the spoken word Eyes: EOMI. Conjunctiva clear and no icterus. Neck: Trachea midline, neck supple without appreciable thyromegaly  or lymphadenopathy and no palpable thyroid  nodules Lungs: Clear to auscultation, no wheeze. Respirations not labored Heart: S1S2, Regular in rate and rhythm. No loud murmurs Abdomen: Soft, non tender, non distended Neurologic: Alert, oriented, normal speech, deep tendon biceps reflexes normal,  no gross focal neurological deficit Extremities: No pedal pitting edema, no tremors of outstretched hands.  No spine tenderness. Skin: Warm, color good.  Psychiatric: Does not appear depressed or anxious  PERTINENT HISTORIC LABORATORY AND IMAGING STUDIES:  All pertinent laboratory results were reviewed. Please see HPI also for further details.   ASSESSMENT / PLAN  1. Elevated serum alkaline phosphatase level   2. Vitamin D  deficiency    - Patient has elevated alkaline phosphatase, workup showed bone origin with elevated bone specific alkaline phosphatase, normal liver enzymes and GGT.  Patient has mildly low vitamin D , unlikely to cause this extent of alkaline phosphatase elevation.  Patient has elevated PSA which is new finding, patient is being evaluated by urology and plan for prostate biopsy next week. - Discussed potential causes of elevated alkaline phosphatase, causes of bone origin elevated alkaline phosphatase can be Paget's disease of bone, hyperparathyroidism, hyperthyroidism, vitamin D  deficiency, metastatic bone disease, etc. Liver origin is unlikely to be in his case. -He has no symptoms suggestive of Paget's disease at this time.  He has no thyroid  disorder.  He has normal serum calcium .  He has mild vitamin D  deficiency.  Plan: -Patient is scheduled to have prostate biopsy next week. -Will plan for further diagnostic tests for alkaline phosphate elevation based on prostate biopsy result. -Patient is asked to call or message our clinic after prostate biopsy is available. -Continue vitamin D3 2000 international unit daily. -Will make follow-up plan in 6 weeks and laboratory test if  planned prior to follow-up  visit.    Diagnoses and all orders for this visit:  Elevated serum alkaline phosphatase level  Vitamin D  deficiency    DISPOSITION Follow up in clinic in 6 weeks suggested.  All questions answered and patient verbalized understanding of the plan.   Dwane Andres, MD [facility] [address] [place], [address] [place], [place] [address] Phone # [phone]  At least part of this note was generated using voice recognition software. Inadvertent word errors may have occurred, which were not recognized during the proofreading process.

## 2023-09-27 NOTE — Patient Instructions (Signed)
 Please call / or message after biopsy result of prostate and will make further plan as needed.

## 2023-10-02 ENCOUNTER — Encounter: Payer: Self-pay | Admitting: Urology

## 2023-10-02 ENCOUNTER — Other Ambulatory Visit (HOSPITAL_BASED_OUTPATIENT_CLINIC_OR_DEPARTMENT_OTHER): Payer: Self-pay | Admitting: Urology

## 2023-10-02 ENCOUNTER — Ambulatory Visit (HOSPITAL_BASED_OUTPATIENT_CLINIC_OR_DEPARTMENT_OTHER)
Admission: RE | Admit: 2023-10-02 | Discharge: 2023-10-02 | Disposition: A | Discharge status: Home / Self Care | Payer: Medicare Other | Source: Ambulatory Visit | Attending: Urology | Admitting: Urology

## 2023-10-02 ENCOUNTER — Ambulatory Visit (INDEPENDENT_AMBULATORY_CARE_PROVIDER_SITE_OTHER): Payer: Medicare Other | Admitting: Urology

## 2023-10-02 VITALS — BP 121/74 | HR 74 | Ht 69.0 in | Wt 175.0 lb

## 2023-10-02 DIAGNOSIS — R972 Elevated prostate specific antigen [PSA]: Secondary | ICD-10-CM

## 2023-10-02 DIAGNOSIS — Z2989 Encounter for other specified prophylactic measures: Secondary | ICD-10-CM | POA: Diagnosis not present

## 2023-10-02 DIAGNOSIS — C61 Malignant neoplasm of prostate: Secondary | ICD-10-CM | POA: Diagnosis not present

## 2023-10-02 DIAGNOSIS — N4232 Atypical small acinar proliferation of prostate: Secondary | ICD-10-CM | POA: Diagnosis not present

## 2023-10-02 DIAGNOSIS — N4231 Prostatic intraepithelial neoplasia: Secondary | ICD-10-CM

## 2023-10-02 MED ORDER — CEFTRIAXONE SODIUM 1 G IJ SOLR
1.0000 g | Freq: Once | INTRAMUSCULAR | Status: AC
  Administered 2023-10-02: 1 g via INTRAMUSCULAR

## 2023-10-02 NOTE — Progress Notes (Signed)
 Assessment: 1. Elevated PSA     Plan: Post biopsy instructions given Return to office in 7-10 for biopsy results  Chief Complaint:  Chief Complaint  Patient presents with   Prostate Biopsy    History of Present Illness:  Brian [name] is a 73 y.o. male who is seen for evaluation of elevated PSA. He was also found to have an elevated alkaline phosphatase of 143 on routine laboratory testing in September 2024.  The bone specific alkaline phosphatase was elevated.  PSA results: 3/20 2.4 3/21 2.0 3/22 2.6 10/22 1.95 9/23 2.57 4/24 2.5 10/24 4.20  Iso PSA 11/24:  PSA 4.0 with isoPSA 11.7 (elevated)  No prior history of PSA elevation.  No prior prostate biopsy.  No history of UTIs or prostatitis. CT from 4/24 showed prostate enlargement with central calcifications. No dysuria or gross hematuria.  No bone pain or weight loss. IPSS = 6.  He has a history of gross and microscopic hematuria.  He had a history of intermittent gross hematuria.  He reported that his urine was intermittently brown in color, occurring every 2-3 days.  No associated flank pain or dysuria.  No prior episodes of gross hematuria. Dipstick urinalysis showed large blood and small leukocyte esterase.  Urine culture grew <10K colonies.  He was evaluated with a CT abdomen and pelvis with contrast on 01/12/2023 which showed no renal or ureteral calculi, no renal mass, no evidence of obstruction.  He has a remote history of kidney stones with 2 stone episodes in the early 1980s. No history of tobacco use.  He has a family history of kidney and prostate cancer with his father. Urinalysis from 01/16/2023 showed >30 RBCs. Review of the CT scan from 01/12/2023 showed a 2 mm calculus in the proximal left ureter without obstruction. He reported passing a stone following the CT scan.  He had no further episodes of gross hematuria.  He was scheduled to undergo cystoscopy in May 2024 but elected to postpone this  evaluation. Repeat urinalysis from July 2024 did not show any evidence of hematuria.  He dents today for further evaluation with a prostate biopsy.   Portions of the above documentation were copied from a prior visit for review purposes only.   Past Medical History:  Past Medical History:  Diagnosis Date   Depression    Duodenitis 10/10/2015   PAF (paroxysmal atrial fibrillation) (HCC) Jan 2017   lone AF in setting of sepsis   Sepsis secondary to UTI (HCC) 10/10/2015    Past Surgical History:  Past Surgical History:  Procedure Laterality Date   APPENDECTOMY     ATRIAL FIBRILLATION ABLATION N/A 05/08/2022   Procedure: ATRIAL FIBRILLATION ABLATION;  Surgeon: Lei Pump, MD;  Location: [facility];  Service: Cardiovascular;  Laterality: N/A;   CARDIOVASCULAR STRESS TEST  01/22/2014   GXT - Ex 10 min (10.9 METs), HR 164 = 103% MPHR. No ST-T wave chagnes or Sx.  LOW RISK  --Duke TM Score 10    HOLTER MONITOR  10/2015   Mostly normal sinus rhythm. Recommended avoiding stimulants, energy drinks and decongestants. No signs of A. fib.Aaron Aas PVCs noted in pairs, singles, bigeminy and trigeminy. No A. fib.   TRANSTHORACIC ECHOCARDIOGRAM  02/2014   Normal LV size and function.  EF 68%.  Mild MR.  Mild TR.  No pulmonary hypertension.  Blood normal.   VASECTOMY      Allergies:  Allergies  Allergen Reactions   Sulfa Antibiotics  childhood    Family History:  Family History  Problem Relation Age of Onset   Cancer Mother    Cancer Father    Heart disease Father    Stroke Father    Cancer Maternal Grandmother    Stroke Maternal Grandfather    Coronary artery disease Other    Cancer Other    Stroke Other    Arrhythmia Other     Social History:  Social History   Tobacco Use   Smoking status: Never   Smokeless tobacco: Never   Tobacco comments:    Never smoke 02/22/22  Vaping Use   Vaping status: Never Used  Substance Use Topics   Alcohol use: No     Alcohol/week: 0.0 standard drinks of alcohol   Drug use: No    ROS: Constitutional:  Negative for fever, chills, weight loss CV: Negative for chest pain, previous MI, hypertension Respiratory:  Negative for shortness of breath, wheezing, sleep apnea, frequent cough GI:  Negative for nausea, vomiting, bloody stool, GERD  Physical exam: BP 121/74   Pulse 74   Ht 5\' 9"  (1.753 m)   Wt 175 lb (79.4 kg)   BMI 25.84 kg/m  GENERAL APPEARANCE:  Well appearing, well developed, well nourished, NAD HEENT:  Atraumatic, normocephalic, oropharynx clear NECK:  Supple without lymphadenopathy or thyromegaly ABDOMEN:  Soft, non-tender, no masses EXTREMITIES:  Moves all extremities well, without clubbing, cyanosis, or edema NEUROLOGIC:  Alert and oriented x 3, normal gait, CN II-XII grossly intact MENTAL STATUS:  appropriate BACK:  Non-tender to palpation, No CVAT SKIN:  Warm, dry, and intact   Results: U/A:0-5 WBC  TRANSRECTAL ULTRASOUND AND PROSTATE BIOPSY  Indication:  Elevated PSA  Prophylactic antibiotic administration: Rocephin   All medications that could result in increased bleeding were discontinued within an appropriate period of the time of biopsy.  Risk including bleeding and infection were discussed.  Informed consent was obtained.  The patient was placed in the left lateral decubitus position.  PROCEDURE 1.  TRANSRECTAL ULTRASOUND OF THE PROSTATE  The 7 MHz transrectal probe was used to image the prostate.  Anal stenosis was not noted.  TRUS volume: 35 ml  Hypoechoic areas: None  Hyperechoic areas: None  Central calcifications: present  Margins:  normal  Seminal Vesicles: normal   PROCEDURE 2:  PROSTATE BIOPSY  A periprostatic block was performed using 1% lidocaine  and transrectal ultrasound guidance. Under transrectal ultrasound guidance, and using the Biopty gun, prostate biopsies were obtained systematically from the apex, mid gland, and base bilaterally.  A  total of 12 cores were obtained.  Hemostasis was obtained with gentle pressure on the prostate.  The procedures were well-tolerated.  No significant bleeding was noted at the end of the procedure.  The patient was stable for discharge from the office.

## 2023-10-02 NOTE — Progress Notes (Signed)
 IM Injection  Patient is present today for an IM Injection for treatment of infection prevention post prostate biopsy Drug: Ceftriaxone   Dose:1g Location:Right upper outer buttocks Lot: [id number] Exp:04/16/2025 Patient tolerated well, no complications were noted  Performed by: Arville Go CMA

## 2023-10-03 LAB — URINALYSIS, ROUTINE W REFLEX MICROSCOPIC
Bilirubin, UA: NEGATIVE
Glucose, UA: NEGATIVE
Ketones, UA: NEGATIVE
Nitrite, UA: NEGATIVE
Protein,UA: NEGATIVE
RBC, UA: NEGATIVE
Specific Gravity, UA: 1.025 (ref 1.005–1.030)
Urobilinogen, Ur: 0.2 mg/dL (ref 0.2–1.0)
pH, UA: 6 (ref 5.0–7.5)

## 2023-10-03 LAB — MICROSCOPIC EXAMINATION

## 2023-10-07 ENCOUNTER — Encounter: Payer: Self-pay | Admitting: Urology

## 2023-10-09 ENCOUNTER — Encounter: Payer: Self-pay | Admitting: Urology

## 2023-10-09 ENCOUNTER — Ambulatory Visit (INDEPENDENT_AMBULATORY_CARE_PROVIDER_SITE_OTHER): Payer: Medicare Other | Admitting: Urology

## 2023-10-09 VITALS — BP 129/86 | HR 90 | Ht 69.0 in | Wt 175.0 lb

## 2023-10-09 DIAGNOSIS — R3129 Other microscopic hematuria: Secondary | ICD-10-CM | POA: Diagnosis not present

## 2023-10-09 DIAGNOSIS — C61 Malignant neoplasm of prostate: Secondary | ICD-10-CM | POA: Insufficient documentation

## 2023-10-09 DIAGNOSIS — Z87898 Personal history of other specified conditions: Secondary | ICD-10-CM

## 2023-10-09 NOTE — Progress Notes (Signed)
Assessment: 1. Prostate cancer (HCC); PSA 4.0; cT1cNxMx; GG 1; low risk disease   2. History of gross hematuria   3. Microscopic hematuria     Plan: I spent a total of 45 minutes counseling Brian [name] regarding the diagnosis of localized prostate cancer.  I spent the first 15 minutes discussing the biopsy results.  Using the [facility] prostate cancer nomogram, I cited a probability of 81 percent for localized prostate cancer, 19 percent of extracapsular extension, 1 percent of seminal vesicle involvement, and 1 percent of lymph node involvement.  I discussed the diagnosis of localized prostate cancer in the natural history of prostate cancer. I then spent the next 30 minutes discussing treatment options for localized prostate cancer.  Specifically, I discussed active surveillance, radical prostatectomy (RALP, RRP, RPP), external beam radiation, low dose rate brachytherapy, cryosurgery, and high intensity frequency ultrasound.  I discussed the risk and benefits of each treatment.  I discussed the potential risk of impotence and incontinence as they relate to treatment of prostate cancer.  Questions were answered.  The patient was given literature regarding prostate cancer to review.  Recommend genomic testing with Decipher for confirmation of low risk disease. Return to office in 3 months   Chief Complaint:  Chief Complaint  Patient presents with   Results    History of Present Illness:  Brian [name] is a 73 y.o. male who is seen for evaluation of elevated PSA. He was also found to have an elevated alkaline phosphatase of 143 on routine laboratory testing in September 2024.  The bone specific alkaline phosphatase was elevated.  PSA results: 3/20 2.4 3/21 2.0 3/22 2.6 10/22 1.95 9/23 2.57 4/24 2.5 10/24 4.20  Iso PSA 11/24:  PSA 4.0 with isoPSA 11.7 (elevated)  No prior history of PSA elevation.  No prior prostate biopsy.  No history of UTIs or prostatitis. CT from 4/24 showed  prostate enlargement with central calcifications. No dysuria or gross hematuria.  No bone pain or weight loss. IPSS = 6.  He has a history of gross and microscopic hematuria.  He had a history of intermittent gross hematuria.  He reported that his urine was intermittently brown in color, occurring every 2-3 days.  No associated flank pain or dysuria.  No prior episodes of gross hematuria. Dipstick urinalysis showed large blood and small leukocyte esterase.  Urine culture grew <10K colonies.  He was evaluated with a CT abdomen and pelvis with contrast on 01/12/2023 which showed no renal or ureteral calculi, no renal mass, no evidence of obstruction.  He has a remote history of kidney stones with 2 stone episodes in the early 1980s. No history of tobacco use.  He has a family history of kidney and prostate cancer with his father. Urinalysis from 01/16/2023 showed >30 RBCs. Review of the CT scan from 01/12/2023 showed a 2 mm calculus in the proximal left ureter without obstruction. He reported passing a stone following the CT scan.  He had no further episodes of gross hematuria.  He was scheduled to undergo cystoscopy in May 2024 but elected to postpone this evaluation. Repeat urinalysis from July 2024 did not show any evidence of hematuria.  He returns today following his transrectal ultrasound and biopsy of the prostate on 10/02/23. PSA: 4.0 ng/ml TRUS volume:  35 ml  PSA density:  0.11 Biopsy results:             Gleason score: 3+ 3 = 6            #  positive cores: 2/6 on right    1/6 on left            Location of cancer: apex, mid gland, and base  Complications after biopsy: none Patient without significant LUTS.    IPSS = 7 Patient without erectile dysfunction.      Portions of the above documentation were copied from a prior visit for review purposes only.   Past Medical History:  Past Medical History:  Diagnosis Date   Depression    Duodenitis 10/10/2015   PAF (paroxysmal atrial  fibrillation) (HCC) Jan 2017   lone AF in setting of sepsis   Sepsis secondary to UTI (HCC) 10/10/2015    Past Surgical History:  Past Surgical History:  Procedure Laterality Date   APPENDECTOMY     ATRIAL FIBRILLATION ABLATION N/A 05/08/2022   Procedure: ATRIAL FIBRILLATION ABLATION;  Surgeon: Regan Lemming, MD;  Location: [facility];  Service: Cardiovascular;  Laterality: N/A;   CARDIOVASCULAR STRESS TEST  01/22/2014   GXT - Ex 10 min (10.9 METs), HR 164 = 103% MPHR. No ST-T wave chagnes or Sx.  LOW RISK  --Duke TM Score 10    HOLTER MONITOR  10/2015   Mostly normal sinus rhythm. Recommended avoiding stimulants, energy drinks and decongestants. No signs of A. fib.Marland Kitchen PVCs noted in pairs, singles, bigeminy and trigeminy. No A. fib.   TRANSTHORACIC ECHOCARDIOGRAM  02/2014   Normal LV size and function.  EF 68%.  Mild MR.  Mild TR.  No pulmonary hypertension.  Blood normal.   VASECTOMY      Allergies:  Allergies  Allergen Reactions   Sulfa Antibiotics     childhood    Family History:  Family History  Problem Relation Age of Onset   Cancer Mother    Cancer Father    Heart disease Father    Stroke Father    Cancer Maternal Grandmother    Stroke Maternal Grandfather    Coronary artery disease Other    Cancer Other    Stroke Other    Arrhythmia Other     Social History:  Social History   Tobacco Use   Smoking status: Never   Smokeless tobacco: Never   Tobacco comments:    Never smoke 02/22/22  Vaping Use   Vaping status: Never Used  Substance Use Topics   Alcohol use: No    Alcohol/week: 0.0 standard drinks of alcohol   Drug use: No    ROS: Constitutional:  Negative for fever, chills, weight loss CV: Negative for chest pain, previous MI, hypertension Respiratory:  Negative for shortness of breath, wheezing, sleep apnea, frequent cough GI:  Negative for nausea, vomiting, bloody stool, GERD  Physical exam: BP 129/86   Pulse 90   Ht 5\' 9"  (1.753 m)    Wt 175 lb (79.4 kg)   BMI 25.84 kg/m  GENERAL APPEARANCE:  Well appearing, well developed, well nourished, NAD HEENT:  Atraumatic, normocephalic, oropharynx clear NECK:  Supple without lymphadenopathy or thyromegaly ABDOMEN:  Soft, non-tender, no masses EXTREMITIES:  Moves all extremities well, without clubbing, cyanosis, or edema NEUROLOGIC:  Alert and oriented x 3, normal gait, CN II-XII grossly intact MENTAL STATUS:  appropriate BACK:  Non-tender to palpation, No CVAT SKIN:  Warm, dry, and intact   Results: U/A: 6-10 WBC, >30 RBC

## 2023-10-14 LAB — URINALYSIS, ROUTINE W REFLEX MICROSCOPIC
Bilirubin, UA: NEGATIVE
Glucose, UA: NEGATIVE
Ketones, UA: NEGATIVE
Nitrite, UA: NEGATIVE
Protein,UA: NEGATIVE
Specific Gravity, UA: 1.02 (ref 1.005–1.030)
Urobilinogen, Ur: 0.2 mg/dL (ref 0.2–1.0)
pH, UA: 7 (ref 5.0–7.5)

## 2023-10-14 LAB — MICROSCOPIC EXAMINATION: RBC, Urine: >30 /[HPF] — AB (ref 0–2)

## 2023-10-21 ENCOUNTER — Other Ambulatory Visit: Payer: Self-pay | Admitting: Family Medicine

## 2023-10-21 DIAGNOSIS — G4700 Insomnia, unspecified: Secondary | ICD-10-CM

## 2023-10-25 ENCOUNTER — Encounter: Payer: Self-pay | Admitting: Urology

## 2023-10-30 ENCOUNTER — Telehealth: Payer: Self-pay | Admitting: Urology

## 2023-10-30 NOTE — Telephone Encounter (Signed)
Decipher test showed high risk group. Results discussed with patient by phone today. I advised him that the decipher results could indicate more aggressive prostate cancer.  This would not necessarily change the current management with active surveillance but could mean that he is less likely to remain on active surveillance indefinitely. Recommend evaluation with a prostate MRI in approximately 3 months. He is scheduled for follow-up in April and we will make arrangements for the MRI at that time. Questions answered. He understands and is in agreement with the plan.

## 2023-10-31 ENCOUNTER — Other Ambulatory Visit: Payer: Self-pay | Admitting: Family Medicine

## 2023-10-31 DIAGNOSIS — L989 Disorder of the skin and subcutaneous tissue, unspecified: Secondary | ICD-10-CM

## 2023-10-31 DIAGNOSIS — I480 Paroxysmal atrial fibrillation: Secondary | ICD-10-CM

## 2023-11-11 ENCOUNTER — Encounter: Payer: Self-pay | Admitting: Endocrinology

## 2023-11-11 ENCOUNTER — Ambulatory Visit (INDEPENDENT_AMBULATORY_CARE_PROVIDER_SITE_OTHER): Payer: Medicare Other | Admitting: Endocrinology

## 2023-11-11 VITALS — BP 106/70 | HR 68 | Resp 20 | Ht 69.0 in | Wt 178.4 lb

## 2023-11-11 DIAGNOSIS — R748 Abnormal levels of other serum enzymes: Secondary | ICD-10-CM | POA: Diagnosis not present

## 2023-11-11 DIAGNOSIS — E559 Vitamin D deficiency, unspecified: Secondary | ICD-10-CM | POA: Diagnosis not present

## 2023-11-11 NOTE — Progress Notes (Unsigned)
 Outpatient Endocrinology Note Brian Arling Cerone, MD   Patient's Name: Brian [name]    DOB: 12-27-1950    MRN: [id number]  REASON OF VISIT:Follow up for elevated alkaline phosphatase  REFERRING PROVIDER: Copland, Gwenlyn Found, MD  PCP:  [name], MD  HISTORY OF PRESENT ILLNESS:   Brian [name] is a 73 y.o. old male with past medical history listed below, is here for new consult for elevated alkaline phosphatase.  Pertinent history: Patient is referred for the evaluation and management of elevated alkaline phosphatase.  Patient has elevated alkaline phosphatase since April 2024, he used to have normal alkaline phosphatase prior to that.  He has normal liver enzymes with normal GGT.  Bone specific alkaline phosphatase 22 elevated.  Serum calcium, thyroid function test are normal.  Vitamin D is mildly low at 27.  He has mild elevation of PSA with elevation of isoPSA.  PSA was normal in April 2024 2.50 normal , elevated to 4.20 in October 2024.  He had prostate enlargement on CT scan in April 2024.  Patient was evaluated by urology due to elevated PSA and plan for prostate biopsy next week.  Patient is currently taking vitamin D3 2000 international unit daily.  Patient denies symptoms of any bony pain, back pain, joint pain or stiffness, headache or hearing loss.  No history of fractures.  No bony deformities.  He has history of knee replacement due to osteoarthritis.  Labs reviewed:   Latest Reference Range & Units 02/19/22 11:26 12/17/22 11:00 01/16/23 10:14 05/23/23 10:02  Alkaline Phosphatase 39 - 117 U/L 116 137 (H) 143 (H) 143 (H)  (H): Data is abnormally high  ALKALINE PHOSPHATASE, BONE SPECIFIC 22.0    Latest Reference Range & Units 07/12/23 09:28  VITD 30.00 - 100.00 ng/mL 27.13 (L)  (L): Data is abnormally low   Latest Reference Range & Units 06/11/22 10:28 12/17/22 11:00 07/12/23 09:28  PSA 0.10 - 4.00 ng/mL 2.57 2.50 4.20 (H)  (H): Data is abnormally  high  Iso-PSA 11.7, which resolved > 6.0   Latest Reference Range & Units 05/23/23 10:02 05/30/23 07:38  AST 0 - 37 U/L 17   ALT 0 - 53 U/L 14   GGT 3 - 70 U/L  21    Latest Reference Range & Units 12/17/22 11:00 05/23/23 10:02  Calcium 8.4 - 10.5 mg/dL 9.5 9.7  TSH 2.13 - 0.86 uIU/mL 2.62     REVIEW OF SYSTEMS:  As per history of present illness.   PAST MEDICAL HISTORY: Past Medical History:  Diagnosis Date   Depression    Duodenitis 10/10/2015   PAF (paroxysmal atrial fibrillation) (HCC) Jan 2017   lone AF in setting of sepsis   Sepsis secondary to UTI (HCC) 10/10/2015    PAST SURGICAL HISTORY: Past Surgical History:  Procedure Laterality Date   APPENDECTOMY     ATRIAL FIBRILLATION ABLATION N/A 05/08/2022   Procedure: ATRIAL FIBRILLATION ABLATION;  Surgeon: Regan Lemming, MD;  Location: [facility];  Service: Cardiovascular;  Laterality: N/A;   CARDIOVASCULAR STRESS TEST  01/22/2014   GXT - Ex 10 min (10.9 METs), HR 164 = 103% MPHR. No ST-T wave chagnes or Sx.  LOW RISK  --Duke TM Score 10    HOLTER MONITOR  10/2015   Mostly normal sinus rhythm. Recommended avoiding stimulants, energy drinks and decongestants. No signs of A. fib.Marland Kitchen PVCs noted in pairs, singles, bigeminy and trigeminy. No A. fib.   TRANSTHORACIC ECHOCARDIOGRAM  02/2014   Normal LV  size and function.  EF 68%.  Mild MR.  Mild TR.  No pulmonary hypertension.  Blood normal.   VASECTOMY      ALLERGIES: Allergies  Allergen Reactions   Sulfa Antibiotics     childhood    FAMILY HISTORY:  Family History  Problem Relation Age of Onset   Cancer Mother    Cancer Father    Heart disease Father    Stroke Father    Cancer Maternal Grandmother    Stroke Maternal Grandfather    Coronary artery disease Other    Cancer Other    Stroke Other    Arrhythmia Other     SOCIAL HISTORY: Social History   Socioeconomic History   Marital status: Married    Spouse name: Mattson Dayal   Number of  children: 2   Years of education: College   Highest education level: Bachelor's degree (e.g., BA, AB, BS)  Occupational History   Occupation: retired Midwife  Tobacco Use   Smoking status: Never   Smokeless tobacco: Never   Tobacco comments:    Never smoke 02/22/22  Vaping Use   Vaping status: Never Used  Substance and Sexual Activity   Alcohol use: No    Alcohol/week: 0.0 standard drinks of alcohol   Drug use: No   Sexual activity: Yes    Partners: Female  Other Topics Concern   Not on file  Social History Narrative   He is married to [name] - who is a Teacher, early years/pre.      He is a retired Emergency planning/management officer, he has 2 school-aged grandchildren who are now 7 and 9.        He enjoys helping to take care of them, building furniture and volunteering with church and [employer].  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- this is still up and running       Exercise- silver sneakers   Left handed   Two story home   Social Drivers of Health   Financial Resource Strain: Low Risk  (05/17/2023)   Overall Financial Resource Strain (CARDIA)    Difficulty of Paying Living Expenses: Not hard at all  Food Insecurity: No Food Insecurity (05/17/2023)   Hunger Vital Sign    Worried About Running Out of Food in the Last Year: Never true    Ran Out of Food in the Last Year: Never true  Transportation Needs: No Transportation Needs (05/17/2023)   PRAPARE - Administrator, Civil Service (Medical): No    Lack of Transportation (Non-Medical): No  Physical Activity: Insufficiently Active (05/17/2023)   Exercise Vital Sign    Days of Exercise per Week: 2 days    Minutes of Exercise per Session: 20 min  Stress: No Stress Concern Present (05/17/2023)   [employer] of Occupational Health - Occupational Stress Questionnaire    Feeling  of Stress : Not at all  Social Connections: Socially Integrated (05/17/2023)   Social Connection and Isolation Panel [NHANES]    Frequency of Communication with Friends and Family: More than three times a week    Frequency of Social Gatherings with Friends and Family: Three times a week    Attends Religious Services: More than 4 times per year    Active Member of Clubs or Organizations: Yes  Attends Engineer, structural: More than 4 times per year    Marital Status: Married    MEDICATIONS:  Current Outpatient Medications  Medication Sig Dispense Refill   atorvastatin (LIPITOR) 10 MG tablet Take 1 tablet (10 mg total) by mouth daily. 90 tablet 3   Cholecalciferol (VITAMIN D3) 50 MCG (2000 UT) capsule Take 2,000 Units by mouth daily.     diltiazem (TIAZAC) 360 MG 24 hr capsule Take 1 capsule (360 mg total) by mouth daily. 90 capsule 0   mirtazapine (REMERON) 30 MG tablet Take 1 tablet (30 mg total) by mouth at bedtime. 90 tablet 0   Multiple Vitamin (MULTIVITAMIN) tablet Take 1 tablet by mouth daily.     PARoxetine (PAXIL) 30 MG tablet TAKE 1 TABLET BY MOUTH EVERY DAY 90 tablet 1   triamcinolone ointment (KENALOG) 0.5 % apply TO SKIN 2 TIMES DAILY 30 g 0   No current facility-administered medications for this visit.    PHYSICAL EXAM: Vitals:   11/11/23 1517  BP: 106/70  Pulse: 68  Resp: 20  SpO2: 97%  Weight: 178 lb 6.4 oz (80.9 kg)  Height: 5\' 9"  (1.753 m)    Body mass index is 26.35 kg/m.  Wt Readings from Last 3 Encounters:  11/11/23 178 lb 6.4 oz (80.9 kg)  10/09/23 175 lb (79.4 kg)  10/02/23 175 lb (79.4 kg)       General: Well developed, well nourished male in no apparent distress. Appropriate for age.  HEENT: AT/[place], no external lesions. Hearing intact to the spoken word Eyes: EOMI. Conjunctiva clear and no icterus. Neck: Trachea midline, neck supple without appreciable thyromegaly or lymphadenopathy and no palpable thyroid nodules Lungs: Clear to  auscultation, no wheeze. Respirations not labored Heart: S1S2, Regular in rate and rhythm. No loud murmurs Abdomen: Soft, non tender, non distended Neurologic: Alert, oriented, normal speech, deep tendon biceps reflexes normal,  no gross focal neurological deficit Extremities: No pedal pitting edema, no tremors of outstretched hands.  No spine tenderness. Skin: Warm, color good.  Psychiatric: Does not appear depressed or anxious  PERTINENT HISTORIC LABORATORY AND IMAGING STUDIES:  All pertinent laboratory results were reviewed. Please see HPI also for further details.   ASSESSMENT / PLAN  1. Elevated serum alkaline phosphatase level   2. Vitamin D deficiency     - Patient has elevated alkaline phosphatase, workup showed bone origin with elevated bone specific alkaline phosphatase, normal liver enzymes and GGT.  Patient has mildly low vitamin D, unlikely to cause this extent of alkaline phosphatase elevation.  Patient has elevated PSA which is new finding, patient is being evaluated by urology and plan for prostate biopsy next week. - Discussed potential causes of elevated alkaline phosphatase, causes of bone origin elevated alkaline phosphatase can be Paget's disease of bone, hyperparathyroidism, hyperthyroidism, vitamin D deficiency, metastatic bone disease, etc. Liver origin is unlikely to be in his case. -He has no symptoms suggestive of Paget's disease at this time.  He has no thyroid disorder.  He has normal serum calcium.  He has mild vitamin D deficiency.  Plan: -Patient is scheduled to have prostate biopsy next week. -Will plan for further diagnostic tests for alkaline phosphate elevation based on prostate biopsy result. -Patient is asked to call or message our clinic after prostate biopsy is available. -Continue vitamin D3 2000 international unit daily. -Will make follow-up plan in 6 weeks and laboratory test if planned prior to follow-up visit.    Diagnoses and all orders for  this visit:  Elevated serum alkaline phosphatase level -     Alkaline phosphatase  Vitamin D deficiency -     VITAMIN D 25 Hydroxy (Vit-D Deficiency, Fractures)     DISPOSITION Follow up in clinic in 6 weeks suggested.  All questions answered and patient verbalized understanding of the plan.   Brian Sherhonda Gaspar, MD [facility] [address] [place], [address] [place], [place] [address] Phone # [phone]  At least part of this note was generated using voice recognition software. Inadvertent word errors may have occurred, which were not recognized during the proofreading process.

## 2023-11-12 ENCOUNTER — Encounter: Payer: Self-pay | Admitting: Endocrinology

## 2023-11-12 ENCOUNTER — Telehealth: Payer: Self-pay

## 2023-11-12 LAB — VITAMIN D 25 HYDROXY (VIT D DEFICIENCY, FRACTURES): Vit D, 25-Hydroxy: 49 ng/mL (ref 30–100)

## 2023-11-12 LAB — ALKALINE PHOSPHATASE: Alkaline phosphatase (APISO): 163 U/L — ABNORMAL HIGH (ref 35–144)

## 2023-11-12 NOTE — Telephone Encounter (Signed)
 Patient given results and medication changes as directed by MD. No further questions at this time. Patient transferred to front desk to place lab appointment.

## 2023-12-02 ENCOUNTER — Other Ambulatory Visit: Payer: Self-pay | Admitting: Family Medicine

## 2023-12-02 DIAGNOSIS — E7800 Pure hypercholesterolemia, unspecified: Secondary | ICD-10-CM

## 2023-12-18 ENCOUNTER — Other Ambulatory Visit: Payer: Self-pay | Admitting: Family Medicine

## 2023-12-18 ENCOUNTER — Encounter: Payer: Medicare Other | Admitting: Family Medicine

## 2023-12-18 DIAGNOSIS — F32A Depression, unspecified: Secondary | ICD-10-CM

## 2023-12-18 NOTE — Patient Instructions (Incomplete)
 It was great to see you again today, I will be in touch with your lab work.

## 2023-12-18 NOTE — Progress Notes (Unsigned)
 [facility] [address], [address] [place], [place] [address] [phone]  Date:  12/19/2023   Name:  Brian [name]   DOB:  1951/08/23   MRN:  [id number]  PCP:  [name], MD    Chief Complaint: No chief complaint on file.   History of Present Illness:  Brian [name] is a 73 y.o. very pleasant male patient who presents with the following:  Patient seen today for physical exam.  Most recent visit with myself was in September of last year istory of A fib status post ablation, dyslipidemia and depression, prediabetes, prostate cancer  COVID, flu up-to-date Shingrix is complete  We noted increased PSA velocity last year, he ended up seeing urology, Dr. Pete Glatter.  He was unfortunately diagnosed with a low risk prostate cancer on recent biopsy. I believe the plan was to do genomic testing to confirm low risk disease and follow-up in 3 months  We had been following vitamin D and alkaline phosphatase abnormality.  He was seen by endocrinology, Dr. Erroll Luna who noted normalization of vitamin D and improvement alkaline phosphatase  Lab Results  Component Value Date   HGBA1C 6.5 05/23/2023    Lipitor Diltiazem 360 ER Mirtazapine Paroxetine 30  Patient Active Problem List   Diagnosis Date Noted   Prostate cancer (HCC); PSA 4.0; cT1cNxMx; GG 1; low risk disease 10/09/2023   Ureteral calculus, left 01/16/2023   Microscopic hematuria 01/16/2023   Gross hematuria 01/16/2023   Secondary hypercoagulable state (HCC) 06/05/2022   History of heat exhaustion 06/19/2020   Exercise intolerance 06/19/2020   Prediabetes 05/28/2020   BMI 25.0-25.9,adult 11/11/2015   Family history of coronary artery disease in father 10/25/2015   Paroxysmal atrial fibrillation (HCC) 10/11/2015   Dyslipidemia 10/10/2015   Depression     Past Medical History:  Diagnosis Date   Depression    Duodenitis 10/10/2015   PAF (paroxysmal atrial  fibrillation) (HCC) Jan 2017   lone AF in setting of sepsis   Sepsis secondary to UTI (HCC) 10/10/2015    Past Surgical History:  Procedure Laterality Date   APPENDECTOMY     ATRIAL FIBRILLATION ABLATION N/A 05/08/2022   Procedure: ATRIAL FIBRILLATION ABLATION;  Surgeon: Regan Lemming, MD;  Location: [facility];  Service: Cardiovascular;  Laterality: N/A;   CARDIOVASCULAR STRESS TEST  01/22/2014   GXT - Ex 10 min (10.9 METs), HR 164 = 103% MPHR. No ST-T wave chagnes or Sx.  LOW RISK  --Duke TM Score 10    HOLTER MONITOR  10/2015   Mostly normal sinus rhythm. Recommended avoiding stimulants, energy drinks and decongestants. No signs of A. fib.Marland Kitchen PVCs noted in pairs, singles, bigeminy and trigeminy. No A. fib.   TRANSTHORACIC ECHOCARDIOGRAM  02/2014   Normal LV size and function.  EF 68%.  Mild MR.  Mild TR.  No pulmonary hypertension.  Blood normal.   VASECTOMY      Social History   Tobacco Use   Smoking status: Never   Smokeless tobacco: Never   Tobacco comments:    Never smoke 02/22/22  Vaping Use   Vaping status: Never Used  Substance Use Topics   Alcohol use: No    Alcohol/week: 0.0 standard drinks of alcohol   Drug use: No    Family History  Problem Relation Age of Onset   Cancer Mother    Cancer Father    Heart disease Father    Stroke Father  Cancer Maternal Grandmother    Stroke Maternal Grandfather    Coronary artery disease Other    Cancer Other    Stroke Other    Arrhythmia Other     Allergies  Allergen Reactions   Sulfa Antibiotics     childhood    Medication list has been reviewed and updated.  Current Outpatient Medications on File Prior to Visit  Medication Sig Dispense Refill   atorvastatin (LIPITOR) 10 MG tablet TAKE 1 TABLET BY MOUTH EVERY DAY 90 tablet 3   Cholecalciferol (VITAMIN D3) 50 MCG (2000 UT) capsule Take 2,000 Units by mouth daily.     diltiazem (TIAZAC) 360 MG 24 hr capsule Take 1 capsule (360 mg total) by mouth  daily. 90 capsule 0   mirtazapine (REMERON) 30 MG tablet Take 1 tablet (30 mg total) by mouth at bedtime. 90 tablet 0   Multiple Vitamin (MULTIVITAMIN) tablet Take 1 tablet by mouth daily.     PARoxetine (PAXIL) 30 MG tablet TAKE 1 TABLET BY MOUTH EVERY DAY 90 tablet 3   triamcinolone ointment (KENALOG) 0.5 % apply TO SKIN 2 TIMES DAILY 30 g 0   No current facility-administered medications on file prior to visit.    Review of Systems:  ***  Physical Examination: There were no vitals filed for this visit. There were no vitals filed for this visit. There is no height or weight on file to calculate BMI. Ideal Body Weight:    ***  Assessment and Plan: ***  Signed Abbe Amsterdam, MD

## 2023-12-19 ENCOUNTER — Ambulatory Visit (INDEPENDENT_AMBULATORY_CARE_PROVIDER_SITE_OTHER): Admitting: Family Medicine

## 2023-12-19 VITALS — BP 120/78 | HR 70 | Temp 97.8°F | Resp 18 | Ht 68.0 in | Wt 174.4 lb

## 2023-12-19 DIAGNOSIS — Z130 Encounter for screening for diseases of the blood and blood-forming organs and certain disorders involving the immune mechanism: Secondary | ICD-10-CM | POA: Diagnosis not present

## 2023-12-19 DIAGNOSIS — R748 Abnormal levels of other serum enzymes: Secondary | ICD-10-CM

## 2023-12-19 DIAGNOSIS — E785 Hyperlipidemia, unspecified: Secondary | ICD-10-CM

## 2023-12-19 DIAGNOSIS — I480 Paroxysmal atrial fibrillation: Secondary | ICD-10-CM

## 2023-12-19 DIAGNOSIS — Z0000 Encounter for general adult medical examination without abnormal findings: Secondary | ICD-10-CM

## 2023-12-19 DIAGNOSIS — R7303 Prediabetes: Secondary | ICD-10-CM

## 2023-12-19 DIAGNOSIS — C61 Malignant neoplasm of prostate: Secondary | ICD-10-CM

## 2023-12-19 DIAGNOSIS — G4700 Insomnia, unspecified: Secondary | ICD-10-CM

## 2023-12-19 MED ORDER — DILTIAZEM HCL ER BEADS 360 MG PO CP24: 360.0000 mg | ORAL_CAPSULE | Freq: Every day | ORAL | 3 refills | Status: DC

## 2023-12-19 MED ORDER — MIRTAZAPINE 30 MG PO TABS: 30.0000 mg | ORAL_TABLET | Freq: Every day | ORAL | 3 refills | Status: DC

## 2023-12-20 ENCOUNTER — Encounter: Payer: Self-pay | Admitting: Family Medicine

## 2023-12-20 LAB — COMPREHENSIVE METABOLIC PANEL WITH GFR
ALT: 16 U/L (ref 0–53)
AST: 18 U/L (ref 0–37)
Albumin: 4.5 g/dL (ref 3.5–5.2)
Alkaline Phosphatase: 168 U/L — ABNORMAL HIGH (ref 39–117)
BUN: 17 mg/dL (ref 6–23)
CO2: 28 meq/L (ref 19–32)
Calcium: 9.6 mg/dL (ref 8.4–10.5)
Chloride: 103 meq/L (ref 96–112)
Creatinine, Ser: 1.01 mg/dL (ref 0.40–1.50)
GFR: 75.05 mL/min (ref >60.00)
Glucose, Bld: 83 mg/dL (ref 70–99)
Potassium: 4.4 meq/L (ref 3.5–5.1)
Sodium: 142 meq/L (ref 135–145)
Total Bilirubin: 0.4 mg/dL (ref 0.2–1.2)
Total Protein: 7.2 g/dL (ref 6.0–8.3)

## 2023-12-20 LAB — LIPID PANEL
Cholesterol: 160 mg/dL (ref 0–200)
HDL: 47.6 mg/dL (ref >39.00)
LDL Cholesterol: 81 mg/dL (ref 0–99)
NonHDL: 112.87
Total CHOL/HDL Ratio: 3
Triglycerides: 161 mg/dL — ABNORMAL HIGH (ref 0.0–149.0)
VLDL: 32.2 mg/dL (ref 0.0–40.0)

## 2023-12-20 LAB — CBC
HCT: 45.1 % (ref 39.0–52.0)
Hemoglobin: 15 g/dL (ref 13.0–17.0)
MCHC: 33.2 g/dL (ref 30.0–36.0)
MCV: 90.4 fl (ref 78.0–100.0)
Platelets: 241 10*3/uL (ref 150.0–400.0)
RBC: 4.99 Mil/uL (ref 4.22–5.81)
RDW: 14.1 % (ref 11.5–15.5)
WBC: 5.5 10*3/uL (ref 4.0–10.5)

## 2023-12-20 LAB — HEMOGLOBIN A1C: Hgb A1c MFr Bld: 6.2 % (ref 4.6–6.5)

## 2024-01-09 ENCOUNTER — Ambulatory Visit: Payer: Medicare Other | Admitting: Urology

## 2024-01-15 ENCOUNTER — Ambulatory Visit (INDEPENDENT_AMBULATORY_CARE_PROVIDER_SITE_OTHER): Payer: Medicare Other | Admitting: Urology

## 2024-01-15 ENCOUNTER — Encounter: Payer: Self-pay | Admitting: Urology

## 2024-01-15 VITALS — BP 124/81 | HR 77 | Ht 69.0 in | Wt 175.0 lb

## 2024-01-15 DIAGNOSIS — Z87898 Personal history of other specified conditions: Secondary | ICD-10-CM | POA: Diagnosis not present

## 2024-01-15 DIAGNOSIS — C61 Malignant neoplasm of prostate: Secondary | ICD-10-CM

## 2024-01-15 LAB — MICROSCOPIC EXAMINATION

## 2024-01-15 LAB — URINALYSIS, ROUTINE W REFLEX MICROSCOPIC
Bilirubin, UA: NEGATIVE
Glucose, UA: NEGATIVE
Ketones, UA: NEGATIVE
Nitrite, UA: NEGATIVE
Protein,UA: NEGATIVE
Specific Gravity, UA: 1.02 (ref 1.005–1.030)
Urobilinogen, Ur: 0.2 mg/dL (ref 0.2–1.0)
pH, UA: 7 (ref 5.0–7.5)

## 2024-01-15 NOTE — Progress Notes (Signed)
 Assessment: 1. Prostate cancer (HCC); PSA 4.0; cT1cNxMx; GG 1; low risk disease   2. History of gross hematuria     Plan: I again discussed the Decipher results potentially indicating more aggressive prostate cancer.  I advised him that this would not necessarily change the current management with active surveillance but could mean that he is less likely to remain on active surveillance indefinitely. PSA today Schedule for MRI prostate for further evaluation -will call with results  Chief Complaint:  Chief Complaint  Patient presents with   Prostate Cancer    History of Present Illness:  Brian [name] is a 73 y.o. male who is seen for continued evaluation of low risk prostate cancer. He was also found to have an elevated alkaline phosphatase of 143 on routine laboratory testing in September 2024.  The bone specific alkaline phosphatase was elevated.  PSA results: 3/20 2.4 3/21 2.0 3/22 2.6 10/22 1.95 9/23 2.57 4/24 2.5 10/24 4.20  Iso PSA 11/24:  PSA 4.0 with isoPSA 11.7 (elevated)  No prior history of PSA elevation.  No prior prostate biopsy.  No history of UTIs or prostatitis. CT from 4/24 showed prostate enlargement with central calcifications. No dysuria or gross hematuria.  No bone pain or weight loss. IPSS = 6.  He has a history of gross and microscopic hematuria.  He had a history of intermittent gross hematuria.  He reported that his urine was intermittently brown in color, occurring every 2-3 days.  No associated flank pain or dysuria.  No prior episodes of gross hematuria. Dipstick urinalysis showed large blood and small leukocyte esterase.  Urine culture grew <10K colonies.  He was evaluated with a CT abdomen and pelvis with contrast on 01/12/2023 which showed no renal or ureteral calculi, no renal mass, no evidence of obstruction.  He has a remote history of kidney stones with 2 stone episodes in the early 1980s. No history of tobacco use.  He has a family  history of kidney and prostate cancer with his father. Urinalysis from 01/16/2023 showed >30 RBCs. Review of the CT scan from 01/12/2023 showed a 2 mm calculus in the proximal left ureter without obstruction. He reported passing a stone following the CT scan.  He had no further episodes of gross hematuria.  He was scheduled to undergo cystoscopy in May 2024 but elected to postpone this evaluation. Repeat urinalysis from July 2024 did not show any evidence of hematuria.  He underwent transrectal ultrasound and biopsy of the prostate on 10/02/23. PSA: 4.0 ng/ml TRUS volume:  35 ml  PSA density:  0.11 Biopsy results:             Gleason score: 3+ 3 = 6            # positive cores: 2/6 on right    1/6 on left            Location of cancer: apex, mid gland, and base  Complications after biopsy: none Patient without significant LUTS.    IPSS = 7 Patient without erectile dysfunction.    Decipher test:  high risk.  He returns today for follow-up.  No new lower urinary tract symptoms.  No dysuria or gross hematuria.  No bone pain or weight loss. IPSS = 5/0.  Portions of the above documentation were copied from a prior visit for review purposes only.   Past Medical History:  Past Medical History:  Diagnosis Date   Depression    Duodenitis 10/10/2015   PAF (  paroxysmal atrial fibrillation) (HCC) Jan 2017   lone AF in setting of sepsis   Sepsis secondary to UTI (HCC) 10/10/2015    Past Surgical History:  Past Surgical History:  Procedure Laterality Date   APPENDECTOMY     ATRIAL FIBRILLATION ABLATION N/A 05/08/2022   Procedure: ATRIAL FIBRILLATION ABLATION;  Surgeon: Lei Pump, MD;  Location: [facility];  Service: Cardiovascular;  Laterality: N/A;   CARDIOVASCULAR STRESS TEST  01/22/2014   GXT - Ex 10 min (10.9 METs), HR 164 = 103% MPHR. No ST-T wave chagnes or Sx.  LOW RISK  --Duke TM Score 10    HOLTER MONITOR  10/2015   Mostly normal sinus rhythm. Recommended avoiding  stimulants, energy drinks and decongestants. No signs of A. fib.Aaron Aas PVCs noted in pairs, singles, bigeminy and trigeminy. No A. fib.   TRANSTHORACIC ECHOCARDIOGRAM  02/2014   Normal LV size and function.  EF 68%.  Mild MR.  Mild TR.  No pulmonary hypertension.  Blood normal.   VASECTOMY      Allergies:  Allergies  Allergen Reactions   Sulfa Antibiotics     childhood    Family History:  Family History  Problem Relation Age of Onset   Cancer Mother    Cancer Father    Heart disease Father    Stroke Father    Cancer Maternal Grandmother    Stroke Maternal Grandfather    Coronary artery disease Other    Cancer Other    Stroke Other    Arrhythmia Other     Social History:  Social History   Tobacco Use   Smoking status: Never   Smokeless tobacco: Never   Tobacco comments:    Never smoke 02/22/22  Vaping Use   Vaping status: Never Used  Substance Use Topics   Alcohol use: No    Alcohol/week: 0.0 standard drinks of alcohol   Drug use: No    ROS: Constitutional:  Negative for fever, chills, weight loss CV: Negative for chest pain, previous MI, hypertension Respiratory:  Negative for shortness of breath, wheezing, sleep apnea, frequent cough GI:  Negative for nausea, vomiting, bloody stool, GERD  Physical exam: BP 124/81   Pulse 77   Ht 5\' 9"  (1.753 m)   Wt 175 lb (79.4 kg)   BMI 25.84 kg/m  GENERAL APPEARANCE:  Well appearing, well developed, well nourished, NAD HEENT:  Atraumatic, normocephalic, oropharynx clear NECK:  Supple without lymphadenopathy or thyromegaly ABDOMEN:  Soft, non-tender, no masses EXTREMITIES:  Moves all extremities well, without clubbing, cyanosis, or edema NEUROLOGIC:  Alert and oriented x 3, normal gait, CN II-XII grossly intact MENTAL STATUS:  appropriate BACK:  Non-tender to palpation, No CVAT SKIN:  Warm, dry, and intact  Results: U/A: 0-5 WBC, 3-10 RBC

## 2024-01-16 ENCOUNTER — Encounter: Payer: Self-pay | Admitting: Urology

## 2024-01-16 LAB — PSA: Prostate Specific Ag, Serum: 4.1 ng/mL — ABNORMAL HIGH (ref 0.0–4.0)

## 2024-02-19 ENCOUNTER — Ambulatory Visit
Admission: RE | Admit: 2024-02-19 | Discharge: 2024-02-19 | Disposition: A | Discharge status: Home / Self Care | Source: Ambulatory Visit | Attending: Urology | Admitting: Urology

## 2024-02-19 DIAGNOSIS — C61 Malignant neoplasm of prostate: Secondary | ICD-10-CM

## 2024-02-19 MED ORDER — GADOPICLENOL 0.5 MMOL/ML IV SOLN
8.0000 mL | Freq: Once | INTRAVENOUS | Status: AC | PRN
  Administered 2024-02-19: 8 mL via INTRAVENOUS

## 2024-02-26 ENCOUNTER — Other Ambulatory Visit: Payer: Self-pay

## 2024-03-02 ENCOUNTER — Other Ambulatory Visit: Payer: Medicare Other

## 2024-03-03 ENCOUNTER — Ambulatory Visit: Payer: Self-pay | Admitting: Urology

## 2024-03-03 DIAGNOSIS — C61 Malignant neoplasm of prostate: Secondary | ICD-10-CM

## 2024-03-04 LAB — ALKALINE PHOSPHATASE: Alkaline phosphatase (APISO): 143 U/L (ref 35–144)

## 2024-03-04 LAB — C-TERMINAL TELOPEPTIDE: C-Telopeptide (CTx): 690 pg/mL

## 2024-03-09 ENCOUNTER — Encounter: Payer: Self-pay | Admitting: Endocrinology

## 2024-03-09 ENCOUNTER — Ambulatory Visit: Payer: Self-pay | Admitting: Endocrinology

## 2024-03-09 ENCOUNTER — Ambulatory Visit (INDEPENDENT_AMBULATORY_CARE_PROVIDER_SITE_OTHER): Payer: Medicare Other | Admitting: Endocrinology

## 2024-03-09 VITALS — BP 108/72 | HR 132 | Resp 20 | Ht 69.0 in | Wt 177.4 lb

## 2024-03-09 DIAGNOSIS — R748 Abnormal levels of other serum enzymes: Secondary | ICD-10-CM

## 2024-03-09 DIAGNOSIS — E559 Vitamin D deficiency, unspecified: Secondary | ICD-10-CM

## 2024-03-09 NOTE — Patient Instructions (Addendum)
 Please check Alkaline phosphatase and C-Telopeptide (CTx) in early morning fasting in September with your PCP.

## 2024-03-09 NOTE — Progress Notes (Signed)
 Outpatient Endocrinology Note [place] Shanan Fitzpatrick, MD   Patient's Name: Brian [name]    DOB: 1950/10/25    MRN: [id number]  REASON OF VISIT:Follow up for elevated alkaline phosphatase  REFERRING PROVIDER: Copland, Harlene BROCKS, MD  PCP:  Watt Harlene BROCKS, MD  HISTORY OF PRESENT ILLNESS:   Jatavis Malek is a 73 y.o. old male with past medical history listed below, is here for follow-up for elevated alkaline phosphatase.  Pertinent history: Patient was referred for the evaluation and management of elevated alkaline phosphatase, initial consult in January 2025.  09/2023 initial consult : patient has elevated alkaline phosphatase since April 2024, he used to have normal alkaline phosphatase prior to that.  Alkaline phosphatase in the range of 137-143 with upper normal limit of 117.  He has normal liver enzymes with normal GGT.  Bone specific alkaline phosphatase 22 elevated.  Serum calcium , thyroid  function test are normal.  Vitamin D  is mildly low at 27.  He has mild elevation of PSA with elevation of isoPSA.  PSA was normal in April 2024 2.50 normal , elevated to 4.20 in October 2024.  He had prostate enlargement on CT scan in April 2024.  Patient denies symptoms of any bony pain, back pain, joint pain or stiffness, headache or hearing loss.  No history of fractures.  No bony deformities.  He has history of knee replacement due to osteoarthritis.  -Patient had elevated PSA and in January 2025 patient had prostate biopsy, diagnosed with localized prostate cancer, decipher test showed high risk.  Currently plan to manage conservatively, and active surveillance.  Patient is taking vitamin D3 2000 international unit daily for vitamin D  deficiency.  Vitamin D  was normal at 49 in February 2025.  Interval history: Patient lab with normal alkaline phosphatase of 143 however bone turnover marker for bone resorption / CTX is mildly elevated at 690.  Patient has no fall and fracture.  Patient completed these  labs in the late morning fasting.  Patient had MRI few days ago showed category 4 lesion of the right posterior medial peripheral zone at the apex of the prostate.  Reports plan to have guided biopsy in few days.  Has been following with urology.  No new complaints today.   Latest Reference Range & Units 03/02/24 10:01  Alkaline phosphatase (APISO) 35 - 144 U/L 143  C-Telopeptide (CTx)  pg/mL 690    REVIEW OF SYSTEMS:  As per history of present illness.   PAST MEDICAL HISTORY: Past Medical History:  Diagnosis Date   Depression    Duodenitis 10/10/2015   PAF (paroxysmal atrial fibrillation) (HCC) Jan 2017   lone AF in setting of sepsis   Sepsis secondary to UTI (HCC) 10/10/2015    PAST SURGICAL HISTORY: Past Surgical History:  Procedure Laterality Date   APPENDECTOMY     ATRIAL FIBRILLATION ABLATION N/A 05/08/2022   Procedure: ATRIAL FIBRILLATION ABLATION;  Surgeon: Inocencio Soyla Lunger, MD;  Location: [facility];  Service: Cardiovascular;  Laterality: N/A;   CARDIOVASCULAR STRESS TEST  01/22/2014   GXT - Ex 10 min (10.9 METs), HR 164 = 103% MPHR. No ST-T wave chagnes or Sx.  LOW RISK  --[facility] TM Score 10    HOLTER MONITOR  10/2015   Mostly normal sinus rhythm. Recommended avoiding stimulants, energy drinks and decongestants. No signs of A. fib.SABRA PVCs noted in pairs, singles, bigeminy and trigeminy. No A. fib.   TRANSTHORACIC ECHOCARDIOGRAM  02/2014   Normal LV size and function.  EF 68%.  Mild MR.  Mild TR.  No pulmonary hypertension.  Blood normal.   VASECTOMY      ALLERGIES: Allergies  Allergen Reactions   Sulfa Antibiotics     childhood    FAMILY HISTORY:  Family History  Problem Relation Age of Onset   Cancer Mother    Cancer Father    Heart disease Father    Stroke Father    Cancer Maternal Grandmother    Stroke Maternal Grandfather    Coronary artery disease Other    Cancer Other    Stroke Other    Arrhythmia Other     SOCIAL HISTORY: Social  History   Socioeconomic History   Marital status: Married    Spouse name: [name] Zalenski   Number of children: 2   Years of education: College   Highest education level: Bachelor's degree (e.g., BA, AB, BS)  Occupational History   Occupation: retired Midwife  Tobacco Use   Smoking status: Never   Smokeless tobacco: Never   Tobacco comments:    Never smoke 02/22/22  Vaping Use   Vaping status: Never Used  Substance and Sexual Activity   Alcohol use: No    Alcohol/week: 0.0 standard drinks of alcohol   Drug use: No   Sexual activity: Yes    Partners: Female  Other Topics Concern   Not on file  Social History Narrative   He is married to [name] - who is a Teacher, early years/pre.      He is a retired Emergency planning/management officer, he has 2 school-aged grandchildren who are now 7 and 9.        He enjoys helping to take care of them, building furniture and volunteering with church and [employer].  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- this is still up and running       Exercise- silver sneakers   Left handed   Two story home   Social Drivers of Health   Financial Resource Strain: Low Risk  (12/12/2023)   Overall Financial Resource Strain (CARDIA)    Difficulty of Paying Living Expenses: Not hard at all  Food Insecurity: No Food Insecurity (12/12/2023)   Hunger Vital Sign    Worried About Running Out of Food in the Last Year: Never true    Ran Out of Food in the Last Year: Never true  Transportation Needs: No Transportation Needs (12/12/2023)   PRAPARE - Administrator, Civil Service (Medical): No    Lack of Transportation (Non-Medical): No  Physical Activity: Insufficiently Active (12/12/2023)   Exercise Vital Sign    Days of Exercise per Week: 1 day    Minutes of Exercise per Session: 10 min  Stress: No Stress Concern Present  (12/12/2023)   [employer] of Occupational Health - Occupational Stress Questionnaire    Feeling of Stress : Not at all  Social Connections: Socially Integrated (12/12/2023)   Social Connection and Isolation Panel    Frequency of Communication with Friends and Family: More than three times a week    Frequency of Social Gatherings with Friends and Family: Twice a week    Attends Religious Services: More than 4 times per year    Active Member of [employer] or Organizations: Yes    Attends Banker Meetings: More than 4 times  per year    Marital Status: Married    MEDICATIONS:  Current Outpatient Medications  Medication Sig Dispense Refill   atorvastatin  (LIPITOR) 10 MG tablet TAKE 1 TABLET BY MOUTH EVERY DAY 90 tablet 3   Cholecalciferol (VITAMIN D3) 50 MCG (2000 UT) capsule Take 2,000 Units by mouth daily.     diltiazem  (TIAZAC ) 360 MG 24 hr capsule Take 1 capsule (360 mg total) by mouth daily. 90 capsule 3   mirtazapine  (REMERON ) 30 MG tablet Take 1 tablet (30 mg total) by mouth at bedtime. 90 tablet 3   Multiple Vitamin (MULTIVITAMIN) tablet Take 1 tablet by mouth daily.     PARoxetine  (PAXIL ) 30 MG tablet TAKE 1 TABLET BY MOUTH EVERY DAY 90 tablet 3   triamcinolone  ointment (KENALOG ) 0.5 % apply TO SKIN 2 TIMES DAILY 30 g 0   No current facility-administered medications for this visit.    PHYSICAL EXAM: Vitals:   03/09/24 1108  BP: 108/72  Pulse: (!) 132  Resp: 20  SpO2: 96%  Weight: 177 lb 6.4 oz (80.5 kg)  Height: 5' 9 (1.753 m)     Body mass index is 26.2 kg/m.  Wt Readings from Last 3 Encounters:  03/09/24 177 lb 6.4 oz (80.5 kg)  01/15/24 175 lb (79.4 kg)  12/19/23 174 lb 6.4 oz (79.1 kg)       General: Well developed, well nourished male in no apparent distress. Appropriate for age.  HEENT: AT/Reserve, no external lesions. Hearing intact to the spoken word Eyes: EOMI. Conjunctiva clear and no icterus. Neck: Trachea midline, neck supple without  appreciable thyromegaly or lymphadenopathy and no palpable thyroid  nodules Lungs: Clear to auscultation, no wheeze. Respirations not labored Heart: S1S2, Regular in rate and rhythm. No loud murmurs Abdomen: Soft, non tender, non distended Neurologic: Alert, oriented, normal speech, deep tendon biceps reflexes normal,  no gross focal neurological deficit Extremities: No pedal pitting edema, no tremors of outstretched hands.  No spine tenderness. Skin: Warm, color good.  Psychiatric: Does not appear depressed or anxious  PERTINENT HISTORIC LABORATORY AND IMAGING STUDIES:  All pertinent laboratory results were reviewed. Please see HPI also for further details.     Latest Reference Range & Units 02/19/22 11:26 12/17/22 11:00 01/16/23 10:14 05/23/23 10:02  Alkaline Phosphatase 39 - 117 U/L 116 137 (H) 143 (H) 143 (H)  (H): Data is abnormally high  ALKALINE PHOSPHATASE, BONE SPECIFIC 22.0    Latest Reference Range & Units 07/12/23 09:28  VITD 30.00 - 100.00 ng/mL 27.13 (L)  (L): Data is abnormally low   Latest Reference Range & Units 06/11/22 10:28 12/17/22 11:00 07/12/23 09:28  PSA 0.10 - 4.00 ng/mL 2.57 2.50 4.20 (H)  (H): Data is abnormally high  Iso-PSA 11.7, which resolved > 6.0   Latest Reference Range & Units 05/23/23 10:02 05/30/23 07:38  AST 0 - 37 U/L 17   ALT 0 - 53 U/L 14   GGT 3 - 70 U/L  21    Latest Reference Range & Units 12/17/22 11:00 05/23/23 10:02  Calcium  8.4 - 10.5 mg/dL 9.5 9.7  TSH 9.64 - 4.49 uIU/mL 2.62     ASSESSMENT / PLAN  1. Elevated serum alkaline phosphatase level   2. Vitamin D  deficiency    - Patient has elevated alkaline phosphatase, workup showed bone origin with elevated bone specific alkaline phosphatase, normal liver enzymes and GGT.  Patient has mildly low vitamin D .  Patient had elevated PSA, biopsy showed localized prostate cancer, following with urology  and plan for active surveillance.  - Discussed potential causes of elevated  alkaline phosphatase, causes of bone origin elevated alkaline phosphatase can be Paget's disease of bone, hyperparathyroidism, hyperthyroidism, vitamin D  deficiency, metastatic bone disease, etc. Liver origin is unlikely to be in his case. -He has no symptoms suggestive of Paget's disease at this time.  He has no thyroid  disorder.  He has normal serum calcium .  He had mild vitamin D  deficiency. - Vitamin D  has normalized on vitamin D  supplement.  Plan: -Patient lab with normalizing of alkaline phosphatase, improved from 163 to 143.  He has mildly elevated CTX, bone turnover marker. -Will continue to monitor alkaline phosphatase conservatively.  It is reassuring that he has normalized alkaline phosphatase now. -Advised for vitamin D  supplement, current dose.  Advised to take calcium  supplement 500 mg 2 times a day in addition to dietary calcium  intake. -I would like to repeat CTX in the early morning fasting in few months, he will complete lab with primary care provider.  If CTX remained elevated with not in acceptable level we will consider for bone density test to check for osteoporosis. - Patient will complete lab with primary care provider with a follow-up visit in September, for CTX and  alkaline phosphatase.  Based on results we will plan for follow-up visit and additional test if needed.    Diagnoses and all orders for this visit:  Elevated serum alkaline phosphatase level  Vitamin D  deficiency   DISPOSITION Follow up in clinic in to be determined based on repeat lab in September  All questions answered and patient verbalized understanding of the plan.   [place] Jaedan Huttner, MD [facility] Endocrinology [facility] [address] [place], [address] [place], [place] [address] Phone # [phone]  At least part of this note was generated using voice recognition software. Inadvertent word errors may have occurred, which were not recognized during the proofreading process.

## 2024-04-02 ENCOUNTER — Telehealth: Payer: Self-pay | Admitting: Urology

## 2024-04-02 ENCOUNTER — Encounter: Payer: Self-pay | Admitting: Urology

## 2024-04-02 NOTE — Telephone Encounter (Signed)
 Suzen from [facility] center called regarding patient.  Said that the [facility] plan is OON and Needing a prior auth for the surgery scheduled for the 23rd.   PH: [phone] Fax: [phone]

## 2024-04-03 ENCOUNTER — Telehealth: Payer: Self-pay | Admitting: Urology

## 2024-04-03 DIAGNOSIS — C61 Malignant neoplasm of prostate: Secondary | ICD-10-CM

## 2024-04-03 NOTE — Telephone Encounter (Signed)
 I called the patient today to discuss his recent biopsy results. He underwent a fusion guided biopsy on 03/30/2024. Pathology indicates Gleason 4+4 = 8 in the right region of interest, and Gleason 3+4 = 7 in the right mid, and Gleason 4+3 = 7 in the right apex. A total of 3/13 cores were positive.  PSA 4.1 from 4/25 Prostate vol = 34 ml  I discussed the biopsy results with Brian [name] today. He now has high risk prostate cancer based on NCCN guidelines. I have recommended that we proceed with further evaluation with a PSMA PET scan followed by discussion of definitive treatment for his high risk prostate cancer. Will order the PSMA PET scan and contact him with results when available.

## 2024-04-15 ENCOUNTER — Encounter (HOSPITAL_COMMUNITY)
Admission: RE | Admit: 2024-04-15 | Discharge: 2024-04-15 | Disposition: A | Discharge status: Home / Self Care | Source: Ambulatory Visit | Attending: Urology | Admitting: Urology

## 2024-04-15 DIAGNOSIS — C61 Malignant neoplasm of prostate: Secondary | ICD-10-CM | POA: Insufficient documentation

## 2024-04-15 MED ORDER — FLOTUFOLASTAT F 18 GALLIUM 296-5846 MBQ/ML IV SOLN
8.3000 | Freq: Once | INTRAVENOUS | Status: AC
  Administered 2024-04-15: 8.3 via INTRAVENOUS

## 2024-04-17 ENCOUNTER — Ambulatory Visit: Payer: Self-pay | Admitting: Urology

## 2024-04-22 ENCOUNTER — Encounter: Payer: Self-pay | Admitting: Urology

## 2024-04-24 ENCOUNTER — Other Ambulatory Visit: Payer: Self-pay | Admitting: Urology

## 2024-04-24 DIAGNOSIS — C61 Malignant neoplasm of prostate: Secondary | ICD-10-CM

## 2024-05-14 NOTE — Progress Notes (Signed)
 GU Location of Tumor / Histology: Prostate Ca  If Prostate Cancer, Gleason Score is (4 + 4) and PSA is (4.1 on 01/15/2024)  Brian [name] presented as referral from Dr. Adine Manly [facility] [place]) elevated PSA.  Biopsies       04/15/2024 Dr. Adine Manly NM PET (PSMA) Skull to Mid Thigh CLINICAL DATA:  Initial treatment strategy for prostate cancer. PSA 4.1 on 01/15/24.  IMPRESSION: Focal area of abnormal uptake along the right side of the prostate towards the apex. Please correlate for location of neoplasm. No specific areas of abnormal uptake elsewhere to suggest metastatic disease including lymph nodes or osseous structures. Few right-sided lung nodules are similar when adjusted for technique to the prior chest CT scan of 2024. Simple continued follow up surveillance is recommended in 1 year. There are few prominent mediastinal nodes identified that are without abnormal uptake but increased from the CT scan of 2024. Recommend continued surveillance and correlation with any history. Follow-up CT scan chest may be beneficial in 3-6 months.    02/19/2024 Dr. Adine Manly MR Prostate with/without Contrast CLINICAL DATA:  Low risk prostate cancer under active surveillance. C61. Biopsy 10/02/2023 revealed low volume Gleason 3+3=6 prostate adenocarcinoma at the right apex, right base, and left mid gland.   IMPRESSION: 1. Small PI-RADS category 4 lesion of the right posteromedial peripheral zone at the apex. Targeting data sent to [facility]. 2. Mild benign prostatic hypertrophy. 3. Mild sigmoid colon diverticulosis.    Past/Anticipated interventions by urology, if any:  Dr. Adine Mates    Past/Anticipated interventions by medical oncology, if any: NA  Weight changes, if any: No  IPSS:  5 SHIM:  21  Bowel/Bladder complaints, if any:   No  Nausea/Vomiting, if any:  no  Pain issues, if any:  0/10  SAFETY ISSUES: Prior radiation?  No Pacemaker/ICD? No Possible current pregnancy? Male Is the patient on methotrexate? No  Current Complaints / other details:     30 minutes spent total, including time for meaningful use questions, reviewing medication, as well as spent in face-to-face time in nurse evaluation with the patient.

## 2024-05-18 ENCOUNTER — Encounter: Payer: Self-pay | Admitting: Radiation Oncology

## 2024-05-18 NOTE — Progress Notes (Signed)
 Radiation Oncology         [phone] ________________________________  Initial Outpatient Consultation  Name: Brian [name] MRN: [id number]  Date: 05/19/2024  DOB: 09-Sep-1951  RR:Rneojwi, Harlene BROCKS, MD  Roseann Adine PARAS., *   REFERRING PHYSICIAN: Roseann Adine PARAS., *  DIAGNOSIS: 73 y.o. gentleman with Stage T1c adenocarcinoma of the prostate with Gleason score of 4+4, and PSA of 4.1.  No diagnosis found.  HISTORY OF PRESENT ILLNESS: Brian [name] is a 73 y.o. male with a diagnosis of prostate cancer. He was noted to have an elevated PSA of 4.2 by his primary care physician, Dr. Watt. Additionally, he was also noted to have an elevated alkaline phosphatase of 143 on routine lab work from his endocrinologist in 05/2023. Accordingly, he was referred for evaluation in urology by Dr. Roseann on 07/22/23,  digital rectal examination performed at that time showed no nodules. The patient proceeded to transrectal ultrasound with 12 biopsies of the prostate on 10/04/23. Pathology results showed 3/12 cores with Gleason 3+3 prostatic adenocarcinoma (with 5-10% involvement in each core). A Decipher genomic test was ordered on the biopsy sample and showed a score of 0.78 on a scale of 0 to 1, indicating a high risk for more aggressive tumor biology. He was placed on close surveillance.  His PSA remained stable, most recently at 4.1 in 12/2023. He underwent a surveillance prostate MRI on 02/19/24 showing: small PI-RADS 4 lesion of right posteromedial  peripheral zone at apex. He was subsequently referred to Dr. Watt for MRI fusion biopsy of the prostate on 03/30/24. The prostate volume measured 36 cc.  Out of 15 core biopsies, 4 were positive.  The maximum Gleason score was 4+4, and this was seen in two cores from the MRI ROI. Additionally, Gleason 4+3 was seen in right apex, and Gleason 3+4 in right mid (with perineural invasion).  He underwent a staging PSMA PET scan on 04/15/24 showing no evidence  of disease outside of the prostate. Incidentally noted were a few right-sided lung nodules, similar to prior chest CT from 2024 when adjusted for technique, and a few prominent mediastinal nodes without abnormal uptake, increased from 2024 CT.  The patient reviewed the biopsy results with his urologist and he has kindly been referred today for discussion of potential radiation treatment options.   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   Depression 2000   Duodenitis 10/10/2015   PAF (paroxysmal atrial fibrillation) (HCC) 09/2015   lone AF in setting of sepsis   Sepsis secondary to UTI (HCC) 10/10/2015      PAST SURGICAL HISTORY: Past Surgical History:  Procedure Laterality Date   APPENDECTOMY  1972   ATRIAL FIBRILLATION ABLATION N/A 05/08/2022   Procedure: ATRIAL FIBRILLATION ABLATION;  Surgeon: Inocencio Soyla Lunger, MD;  Location: [facility];  Service: Cardiovascular;  Laterality: N/A;   CARDIOVASCULAR STRESS TEST  01/22/2014   GXT - Ex 10 min (10.9 METs), HR 164 = 103% MPHR. No ST-T wave chagnes or Sx.  LOW RISK  --D[facility] TM Score 10    HOLTER MONITOR  10/2015   Mostly normal sinus rhythm. Recommended avoiding stimulants, energy drinks and decongestants. No signs of A. fib.SABRA PVCs noted in pairs, singles, bigeminy and trigeminy. No A. fib.   JOINT REPLACEMENT  2024   left knee   TRANSTHORACIC  ECHOCARDIOGRAM  02/2014   Normal LV size and function.  EF 68%.  Mild MR.  Mild TR.  No pulmonary hypertension.  Blood normal.   VASECTOMY      FAMILY HISTORY:  Family History  Problem Relation Age of Onset   Cancer Mother    Cancer Father    Heart disease Father    Stroke Father    Arthritis Father    Kidney disease Father    Cancer Maternal Grandmother    Stroke Maternal Grandfather    Coronary artery disease Other    Cancer  Other    Stroke Other    Arrhythmia Other     SOCIAL HISTORY:  Social History   Socioeconomic History   Marital status: Married    Spouse name: [name] Ogden   Number of children: 2   Years of education: College   Highest education level: Bachelor's degree (e.g., BA, AB, BS)  Occupational History   Occupation: retired Midwife  Tobacco Use   Smoking status: Never   Smokeless tobacco: Never   Tobacco comments:    Never smoke 02/22/22  Vaping Use   Vaping status: Never Used  Substance and Sexual Activity   Alcohol use: No    Alcohol/week: 0.0 standard drinks of alcohol   Drug use: No   Sexual activity: Yes    Partners: Female  Other Topics Concern   Not on file  Social History Narrative   He is married to [name] - who is a Teacher, early years/pre.      He is a retired Emergency planning/management officer, he has 2 school-aged grandchildren who are now 7 and 9.        He enjoys helping to take care of them, building furniture and volunteering with church and [employer].  His grandkids are doing virtual school- he and his wife are helping to do the home schooling   He is not doing as much volunteer work during the pandemic   He helps to review the [employer] scout projects- this is still up and running       Exercise- silver sneakers   Left handed   Two story home   Social Drivers of Health   Financial Resource Strain: Low Risk  (12/12/2023)   Overall Financial Resource Strain (CARDIA)    Difficulty of Paying Living Expenses: Not hard at all  Food Insecurity: No Food Insecurity (12/12/2023)   Hunger Vital Sign    Worried About Running Out of Food in the Last Year: Never true    Ran Out of Food in the Last Year: Never true  Transportation Needs: No Transportation Needs (12/12/2023)   PRAPARE - Administrator, Civil Service (Medical): No    Lack of Transportation (Non-Medical): No  Physical Activity: Insufficiently Active (12/12/2023)   Exercise Vital Sign    Days of Exercise per Week: 1 day     Minutes of Exercise per Session: 10 min  Stress: No Stress Concern Present (12/12/2023)   [employer] of Occupational Health - Occupational Stress Questionnaire    Feeling of Stress : Not at all  Social Connections: Socially Integrated (12/12/2023)   Social Connection and Isolation Panel    Frequency of Communication with Friends and Family: More than three times a week    Frequency of Social Gatherings with Friends and Family: Twice a week    Attends Religious Services: More than 4 times per year    Active Member of [employer] or Organizations: Yes  Attends Banker Meetings: More than 4 times per year    Marital Status: Married  Catering manager Violence: Not At Risk (03/21/2021)   Humiliation, Afraid, Rape, and Kick questionnaire    Fear of Current or Ex-Partner: No    Emotionally Abused: No    Physically Abused: No    Sexually Abused: No    ALLERGIES: Sulfa antibiotics  MEDICATIONS:  Current Outpatient Medications  Medication Sig Dispense Refill   atorvastatin  (LIPITOR) 10 MG tablet TAKE 1 TABLET BY MOUTH EVERY DAY 90 tablet 3   Cholecalciferol (VITAMIN D3) 50 MCG (2000 UT) capsule Take 2,000 Units by mouth daily.     diltiazem  (TIAZAC ) 360 MG 24 hr capsule Take 1 capsule (360 mg total) by mouth daily. 90 capsule 3   mirtazapine  (REMERON ) 30 MG tablet Take 1 tablet (30 mg total) by mouth at bedtime. 90 tablet 3   Multiple Vitamin (MULTIVITAMIN) tablet Take 1 tablet by mouth daily.     PARoxetine  (PAXIL ) 30 MG tablet TAKE 1 TABLET BY MOUTH EVERY DAY 90 tablet 3   triamcinolone  ointment (KENALOG ) 0.5 % apply TO SKIN 2 TIMES DAILY 30 g 0   No current facility-administered medications for this encounter.    REVIEW OF SYSTEMS:  On review of systems, the patient reports that he is doing well overall. He denies any chest pain, shortness of breath, cough, fevers, chills, night sweats, unintended weight changes. He denies any bowel disturbances, and denies abdominal pain,  nausea or vomiting. He denies any new musculoskeletal or joint aches or pains. His IPSS was ***, indicating *** urinary symptoms. His SHIM was ***, indicating he {does not have/has mild/moderate/severe} erectile dysfunction. A complete review of systems is obtained and is otherwise negative.    PHYSICAL EXAM:  Wt Readings from Last 3 Encounters:  03/09/24 177 lb 6.4 oz (80.5 kg)  01/15/24 175 lb (79.4 kg)  12/19/23 174 lb 6.4 oz (79.1 kg)   Temp Readings from Last 3 Encounters:  12/19/23 97.8 F (36.6 C) (Temporal)  05/23/23 98 F (36.7 C) (Temporal)  12/17/22 97.7 F (36.5 C) (Temporal)   BP Readings from Last 3 Encounters:  03/09/24 108/72  01/15/24 124/81  12/19/23 120/78   Pulse Readings from Last 3 Encounters:  03/09/24 (!) 132  01/15/24 77  12/19/23 70    /10  In general this is a well appearing *** male in no acute distress. He's alert and oriented x4 and appropriate throughout the examination. Cardiopulmonary assessment is negative for acute distress, and he exhibits normal effort.     KPS = ***  100 - Normal; no complaints; no evidence of disease. 90   - Able to carry on normal activity; minor signs or symptoms of disease. 80   - Normal activity with effort; some signs or symptoms of disease. 65   - Cares for self; unable to carry on normal activity or to do active work. 60   - Requires occasional assistance, but is able to care for most of his personal needs. 50   - Requires considerable assistance and frequent medical care. 40   - Disabled; requires special care and assistance. 30   - Severely disabled; hospital admission is indicated although death not imminent. 20   - Very sick; hospital admission necessary; active supportive treatment necessary. 10   - Moribund; fatal processes progressing rapidly. 0     - Dead  Karnofsky DA, Abelmann WH, Craver LS and Burchenal [facility] [phone]) The use of the nitrogen  mustards in the palliative treatment of carcinoma: with  particular reference to bronchogenic carcinoma Cancer 1 634-56  LABORATORY DATA:  Lab Results  Component Value Date   WBC 5.5 12/19/2023   HGB 15.0 12/19/2023   HCT 45.1 12/19/2023   MCV 90.4 12/19/2023   PLT 241.0 12/19/2023   Lab Results  Component Value Date   NA 142 12/19/2023   K 4.4 12/19/2023   CL 103 12/19/2023   CO2 28 12/19/2023   Lab Results  Component Value Date   ALT 16 12/19/2023   AST 18 12/19/2023   ALKPHOS 168 (H) 12/19/2023   BILITOT 0.4 12/19/2023     RADIOGRAPHY: No results found.    IMPRESSION/PLAN: 1. 73 y.o. gentleman with Stage T1c adenocarcinoma of the prostate with Gleason Score of 4+4, and PSA of 4.1. We discussed the patient's workup and outlined the nature of prostate cancer in this setting. The patient's T stage, Gleason's score, and PSA put him into the high risk group. Accordingly, he is eligible for a variety of potential treatment options including LT-ADT concurrent with 8 weeks of external radiation, 5 weeks of external radiation with an upfront brachytherapy boost, or prostatectomy. We discussed the available radiation techniques, and focused on the details and logistics of delivery. We discussed and outlined the risks, benefits, short and long-term effects associated with radiotherapy and compared and contrasted these with prostatectomy. We discussed the role of SpaceOAR gel in reducing the rectal toxicity associated with radiotherapy. We also detailed the role of ADT in the treatment of high risk prostate cancer and outlined the associated side effects that could be expected with this therapy. He appears to have a good understanding of his disease and our treatment recommendations which are of curative intent.  He was encouraged to ask questions that were answered to his stated satisfaction.  At the conclusion of our conversation, the patient is interested in moving forward with ***.  We personally spent *** minutes in this encounter including  chart review, reviewing radiological studies, meeting face-to-face with the patient, entering orders and completing documentation.    Sabra MICAEL Rusk, PA-C    Donnice Barge, MD  [facility]  Radiation Oncology Direct Dial: [phone]  Fax: [phone] [email or web address]  Skype  LinkedIn   This document serves as a record of services personally performed by Donnice Barge, MD and Sabra Rusk, PA-C. It was created on their behalf by Izetta Neither, a trained medical scribe. The creation of this record is based on the scribe's personal observations and the provider's statements to them. This document has been checked and approved by the attending provider.

## 2024-05-19 ENCOUNTER — Ambulatory Visit
Admission: RE | Admit: 2024-05-19 | Discharge: 2024-05-19 | Disposition: A | Discharge status: Home / Self Care | Source: Ambulatory Visit | Attending: Radiation Oncology | Admitting: Radiation Oncology

## 2024-05-19 ENCOUNTER — Encounter: Payer: Self-pay | Admitting: Radiation Oncology

## 2024-05-19 VITALS — BP 140/95 | HR 81 | Temp 97.6°F | Resp 19 | Ht 69.0 in | Wt 174.8 lb

## 2024-05-19 DIAGNOSIS — I2510 Atherosclerotic heart disease of native coronary artery without angina pectoris: Secondary | ICD-10-CM | POA: Diagnosis not present

## 2024-05-19 DIAGNOSIS — Z8719 Personal history of other diseases of the digestive system: Secondary | ICD-10-CM | POA: Insufficient documentation

## 2024-05-19 DIAGNOSIS — I480 Paroxysmal atrial fibrillation: Secondary | ICD-10-CM | POA: Insufficient documentation

## 2024-05-19 DIAGNOSIS — R918 Other nonspecific abnormal finding of lung field: Secondary | ICD-10-CM | POA: Diagnosis not present

## 2024-05-19 DIAGNOSIS — Z809 Family history of malignant neoplasm, unspecified: Secondary | ICD-10-CM | POA: Insufficient documentation

## 2024-05-19 DIAGNOSIS — I700 Atherosclerosis of aorta: Secondary | ICD-10-CM | POA: Insufficient documentation

## 2024-05-19 DIAGNOSIS — R972 Elevated prostate specific antigen [PSA]: Secondary | ICD-10-CM | POA: Insufficient documentation

## 2024-05-19 DIAGNOSIS — C61 Malignant neoplasm of prostate: Secondary | ICD-10-CM | POA: Diagnosis present

## 2024-05-19 DIAGNOSIS — Z79899 Other long term (current) drug therapy: Secondary | ICD-10-CM | POA: Insufficient documentation

## 2024-05-19 HISTORY — DX: Unspecified osteoarthritis, unspecified site: M19.90

## 2024-05-19 HISTORY — DX: Malignant (primary) neoplasm, unspecified: C80.1

## 2024-05-19 HISTORY — DX: Cardiac arrhythmia, unspecified: I49.9

## 2024-05-19 NOTE — Progress Notes (Signed)
 Introduced myself to the patient, and his wife, as the prostate nurse navigator.  No barriers to care identified at this time.  He is here to discuss his radiation treatment options and will proceed with ADT and daily radiation.  I gave him my business card and asked him to call me with questions or concerns.  Verbalized understanding.

## 2024-05-20 DIAGNOSIS — I700 Atherosclerosis of aorta: Secondary | ICD-10-CM | POA: Insufficient documentation

## 2024-05-20 DIAGNOSIS — I779 Disorder of arteries and arterioles, unspecified: Secondary | ICD-10-CM | POA: Insufficient documentation

## 2024-05-20 DIAGNOSIS — R918 Other nonspecific abnormal finding of lung field: Secondary | ICD-10-CM | POA: Insufficient documentation

## 2024-05-20 DIAGNOSIS — I2510 Atherosclerotic heart disease of native coronary artery without angina pectoris: Secondary | ICD-10-CM | POA: Insufficient documentation

## 2024-05-21 ENCOUNTER — Telehealth: Payer: Self-pay | Admitting: Urology

## 2024-05-21 NOTE — Telephone Encounter (Signed)
 Pt called and scheduled for  05/27/24 ANN

## 2024-05-21 NOTE — Telephone Encounter (Signed)
-----   Message from Adine Manly sent at 05/20/2024  5:21 PM EDT ----- Please add to my schedule in the next 1-2 weeks to discuss ADT for prostate cancer.

## 2024-05-27 ENCOUNTER — Ambulatory Visit (INDEPENDENT_AMBULATORY_CARE_PROVIDER_SITE_OTHER): Admitting: Urology

## 2024-05-27 ENCOUNTER — Encounter: Payer: Self-pay | Admitting: Urology

## 2024-05-27 VITALS — BP 104/66 | HR 88 | Ht 69.0 in | Wt 170.0 lb

## 2024-05-27 DIAGNOSIS — C61 Malignant neoplasm of prostate: Secondary | ICD-10-CM

## 2024-05-27 LAB — URINALYSIS, ROUTINE W REFLEX MICROSCOPIC
Bilirubin, UA: NEGATIVE
Glucose, UA: NEGATIVE
Ketones, UA: NEGATIVE
Leukocytes,UA: NEGATIVE
Nitrite, UA: NEGATIVE
Protein,UA: NEGATIVE
RBC, UA: NEGATIVE
Specific Gravity, UA: 1.02 (ref 1.005–1.030)
Urobilinogen, Ur: 0.2 mg/dL (ref 0.2–1.0)
pH, UA: 6.5 (ref 5.0–7.5)

## 2024-05-27 MED ORDER — ORGOVYX 120 MG PO TABS: 120.0000 mg | ORAL_TABLET | Freq: Every day | ORAL | 11 refills | Status: AC

## 2024-05-27 NOTE — Progress Notes (Signed)
 Assessment: 1. Prostate cancer (HCC); high risk; GG 2,3,4     Plan: I reviewed the biopsy results, imaging studies, and recommendations for treatment with the patient today. I agree with plans for radiation therapy and long-term ADT given his high risk disease. The role of ADT in the management of high risk prostate cancer discussed.  Potential side effects discussed. Recommend Orgovyx  given decreased cardiac side effects  Sample bottle of Orgovyx  provided. Will arrange for fiducial markers and SpaceOAR in November 2025.   Chief Complaint:  Chief Complaint  Patient presents with   Prostate Cancer    History of Present Illness:  Brian [name] is a 73 y.o. male who is seen for continued evaluation of low risk prostate cancer. He was also found to have an elevated alkaline phosphatase of 143 on routine laboratory testing in September 2024.  The bone specific alkaline phosphatase was elevated.  PSA results: 3/20 2.4 3/21 2.0 3/22 2.6 10/22 1.95 9/23 2.57 4/24 2.5 10/24 4.20  Iso PSA 11/24:  PSA 4.0 with isoPSA 11.7 (elevated)  No prior history of PSA elevation.  No prior prostate biopsy.  No history of UTIs or prostatitis. CT from 4/24 showed prostate enlargement with central calcifications. No dysuria or gross hematuria.  No bone pain or weight loss. IPSS = 6.  He has a history of gross and microscopic hematuria.  He had a history of intermittent gross hematuria.  He reported that his urine was intermittently brown in color, occurring every 2-3 days.  No associated flank pain or dysuria.  No prior episodes of gross hematuria. Dipstick urinalysis showed large blood and small leukocyte esterase.  Urine culture grew <10K colonies.  He was evaluated with a CT abdomen and pelvis with contrast on 01/12/2023 which showed no renal or ureteral calculi, no renal mass, no evidence of obstruction.  He has a remote history of kidney stones with 2 stone episodes in the early 1980s. No  history of tobacco use.  He has a family history of kidney and prostate cancer with his father. Urinalysis from 01/16/2023 showed >30 RBCs. Review of the CT scan from 01/12/2023 showed a 2 mm calculus in the proximal left ureter without obstruction. He reported passing a stone following the CT scan.  He had no further episodes of gross hematuria.  He was scheduled to undergo cystoscopy in May 2024 but elected to postpone this evaluation. Repeat urinalysis from July 2024 did not show any evidence of hematuria.  He underwent transrectal ultrasound and biopsy of the prostate on 10/02/23. PSA: 4.0 ng/ml TRUS volume:  35 ml  PSA density:  0.11 Biopsy results:             Gleason score: 3+ 3 = 6            # positive cores: 2/6 on right    1/6 on left            Location of cancer: apex, mid gland, and base  Complications after biopsy: none Patient without significant LUTS.    IPSS = 7 Patient without erectile dysfunction.    Decipher test:  high risk. PSA 4/25:  4.1 Prostate MRI from 6/25 showed a PI-RADS 4 lesion on the right apex, vol 34 ml. He subsequently underwent a fusion biopsy on 03/30/2024. Biopsy results: Gleason 4+4 = 8 from right ROI, Gleason 3+4 = 7 from right mid, Gleason 4+3 = 7 from right apex. PSMA PET scan from 04/16/2024 showed abnormal uptake on right side  of prostate but no evidence of metastatic disease. He was evaluated by radiation oncology for treatment of high risk prostate cancer.  He has elected to proceed with external beam therapy along with long-term ADT.  He presents today to discuss initiation of ADT. No new lower urinary tract symptoms.  No bone pain or weight loss.  Portions of the above documentation were copied from a prior visit for review purposes only.   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   Depression 2000   Duodenitis 10/10/2015    Elevated PSA    PAF (paroxysmal atrial fibrillation) (HCC) 09/2015   lone AF in setting of sepsis   Sepsis secondary to UTI (HCC) 10/10/2015    Past Surgical History:  Past Surgical History:  Procedure Laterality Date   APPENDECTOMY  1972   ATRIAL FIBRILLATION ABLATION N/A 05/08/2022   Procedure: ATRIAL FIBRILLATION ABLATION;  Surgeon: Inocencio Soyla Lunger, MD;  Location: [facility];  Service: Cardiovascular;  Laterality: N/A;   CARDIOVASCULAR STRESS TEST  01/22/2014   GXT - Ex 10 min (10.9 METs), HR 164 = 103% MPHR. No ST-T wave chagnes or Sx.  LOW RISK  --Duke TM Score 10    HOLTER MONITOR  10/2015   Mostly normal sinus rhythm. Recommended avoiding stimulants, energy drinks and decongestants. No signs of A. fib.SABRA PVCs noted in pairs, singles, bigeminy and trigeminy. No A. fib.   JOINT REPLACEMENT  2024   left knee   PROSTATE BIOPSY     TRANSTHORACIC ECHOCARDIOGRAM  02/2014   Normal LV size and function.  EF 68%.  Mild MR.  Mild TR.  No pulmonary hypertension.  Blood normal.   VASECTOMY      Allergies:  Allergies  Allergen Reactions   Sulfa Antibiotics     childhood    Family History:  Family History  Problem Relation Age of Onset   Cancer Mother    Cancer Father    Heart disease Father    Stroke Father    Arthritis Father    Kidney disease Father    Cancer Maternal Grandmother    Stroke Maternal Grandfather    Coronary artery disease Other    Cancer Other    Stroke Other    Arrhythmia Other     Social History:  Social History   Tobacco Use   Smoking status: Never   Smokeless tobacco: Never   Tobacco comments:    Never smoke 02/22/22  Vaping Use   Vaping status: Never Used  Substance Use Topics   Alcohol use: No    Alcohol/week: 0.0 standard drinks of alcohol   Drug use: No    ROS: Constitutional:  Negative for fever, chills, weight loss CV: Negative for chest pain, previous MI, hypertension Respiratory:  Negative for shortness of breath,  wheezing, sleep apnea, frequent cough GI:  Negative for nausea, vomiting, bloody stool, GERD  Physical exam: BP 104/66   Pulse 88   Ht 5' 9 (1.753 m)   Wt 170 lb (77.1 kg)   BMI 25.10 kg/m  GENERAL APPEARANCE:  Well appearing, well developed, well nourished, NAD HEENT:  Atraumatic, normocephalic, oropharynx clear NECK:  Supple without lymphadenopathy or thyromegaly ABDOMEN:  Soft, non-tender, no masses EXTREMITIES:  Moves all extremities well, without clubbing, cyanosis, or edema NEUROLOGIC:  Alert and oriented x 3, normal gait, CN II-XII grossly  intact MENTAL STATUS:  appropriate BACK:  Non-tender to palpation, No CVAT SKIN:  Warm, dry, and intact  Results: U/A: negative

## 2024-05-28 NOTE — Progress Notes (Signed)
 Patient has received Orgovyx  for his LT-ADT on 9/10.  Pending fiducial marker's and spaceOAR gel placement date at this time for approximately 2 months post start of ADT.

## 2024-05-29 ENCOUNTER — Telehealth: Payer: Self-pay | Admitting: Urology

## 2024-05-29 NOTE — Telephone Encounter (Signed)
 Nathanel a [employer] called to make us  aware Mr. Golob will be receving his medication Orgovyx  tommorow. Her phone number is [phone] if you have any questions.

## 2024-06-12 NOTE — Patient Instructions (Addendum)
 It was good to see you today, we will be thinking about you as you go for prostate cancer treatment If not done already recommend flu shot this fall - done! and a COVID booster I will be in touch with your labs Plan for CT chest in January/ February of 2026

## 2024-06-12 NOTE — Progress Notes (Addendum)
 [facility] [address], [address] [place], [place] [address] [phone]  Date:  06/15/2024   Name:  Brian [name]   DOB:  07-03-1951   MRN:  [id number]  PCP:  Watt Harlene BROCKS, MD    Chief Complaint: Follow-up   History of Present Illness:  Brian [name] is a 73 y.o. very pleasant male patient who presents with the following:  Patient seen today for periodic follow-up.  I saw him most recently in April History of A fib status post ablation, dyslipidemia and depression, prediabetes, prostate cancer  He has prostate cancer is being managed by Dr. Roseann He also has history of elevated alkaline phosphatase and abnormal vitamin D , he has been seeing Dr. Mercie with endocrinology Married to [name], 2 children, 2 grandchildren  Most recent visit with Dr. Roseann earlier this month-unfortunately he has high risk disease, they plan for radiation and long-term ADT He is seeing radiation oncology, Dr. Patrcia Visit with Dr. Mercie 6/23 regarding his elevated alkaline phosphatase-continue conservative monitoring for now, his PET scan was normal  Discussed the use of AI scribe software for clinical note transcription with the patient, who gave verbal consent to proceed.  Flu shot- give today  COVID booster- he will do  Shingrix and RSV are complete Most recent blood work in May, A1c 6.2% I also did check his lipids at that time  Lipitor 10 Diltiazem  Mirtazapine  at bedtime Paroxetine  30 Relugolix  120 daily History of Present Illness Brian [name] is a 73 year old male with prostate cancer who presents for follow-up regarding his treatment plan.  He has been diagnosed with prostate cancer, which has recently worsened. He is scheduled to begin radiation therapy in November or December, following the placement of markers at the end of October. He has been on androgen deprivation therapy (ADT) for one month and will  continue for another month before starting radiation. The radiation treatment is planned for eight weeks.  He feels great overall and has not experienced any significant urinary symptoms, stating he can 'pee okay'. The only side effect he has noticed from ADT is a slight increase in arthritis symptoms, which he describes as not severe enough to require medication.  He has been evaluated by endocrinology, and no bone issues were identified. He has been taking vitamin D  supplements, 2000 IU daily, and calcium  to address a previous deficiency. He is also engaging in weight training at the gym two to three times a week to help maintain bone density. A bone density scan is planned to establish a baseline.  A recent PET scan revealed a small lung nodule that was not previously seen. Continued surveillance is recommended, with a follow-up CT scan suggested in January or February. He has no history of smoking.  He has been taking Orgovyx  (relugolix ) as part of his treatment regimen.   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   Multiple lung nodules 05/20/2024   Calcification of aorta 05/20/2024   Prostate cancer (HCC); PSA 4.0; cT1cNxMx; GG 1; low risk disease 10/09/2023   Ureteral calculus, left 01/16/2023   Microscopic hematuria 01/16/2023   Gross hematuria 01/16/2023   Secondary hypercoagulable state 06/05/2022   History of heat exhaustion 06/19/2020   Exercise intolerance 06/19/2020   Prediabetes 05/28/2020   BMI 25.0-25.9,adult 11/11/2015   Family history of coronary artery disease in father 10/25/2015  Paroxysmal atrial fibrillation (HCC) 10/11/2015   Dyslipidemia 10/10/2015   Depression     Past Medical History:  Diagnosis Date   Arrhythmia    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   Depression 2000   Duodenitis 10/10/2015   Elevated PSA    PAF  (paroxysmal atrial fibrillation) (HCC) 09/2015   lone AF in setting of sepsis   Sepsis secondary to UTI (HCC) 10/10/2015    Past Surgical History:  Procedure Laterality Date   APPENDECTOMY  1972   ATRIAL FIBRILLATION ABLATION N/A 05/08/2022   Procedure: ATRIAL FIBRILLATION ABLATION;  Surgeon: Inocencio Soyla Lunger, MD;  Location: [facility];  Service: Cardiovascular;  Laterality: N/A;   CARDIOVASCULAR STRESS TEST  01/22/2014   GXT - Ex 10 min (10.9 METs), HR 164 = 103% MPHR. No ST-T wave chagnes or Sx.  LOW RISK  --[facility] TM Score 10    HOLTER MONITOR  10/2015   Mostly normal sinus rhythm. Recommended avoiding stimulants, energy drinks and decongestants. No signs of A. fib.SABRA PVCs noted in pairs, singles, bigeminy and trigeminy. No A. fib.   JOINT REPLACEMENT  2024   left knee   PROSTATE BIOPSY     TRANSTHORACIC ECHOCARDIOGRAM  02/2014   Normal LV size and function.  EF 68%.  Mild MR.  Mild TR.  No pulmonary hypertension.  Blood normal.   VASECTOMY      Social History   Tobacco Use   Smoking status: Never   Smokeless tobacco: Never   Tobacco comments:    Never smoke 02/22/22  Vaping Use   Vaping status: Never Used  Substance Use Topics   Alcohol use: No    Alcohol/week: 0.0 standard drinks of alcohol   Drug use: No    Family History  Problem Relation Age of Onset   Cancer Mother    Cancer Father    Heart disease Father    Stroke Father    Arthritis Father    Kidney disease Father    Cancer Maternal Grandmother    Stroke Maternal Grandfather    Coronary artery disease Other    Cancer Other    Stroke Other    Arrhythmia Other     Allergies  Allergen Reactions   Sulfa Antibiotics     childhood    Medication list has been reviewed and updated.  Current Outpatient Medications on File Prior to Visit  Medication Sig Dispense Refill   atorvastatin  (LIPITOR) 10 MG tablet TAKE 1 TABLET BY MOUTH EVERY DAY 90 tablet 3   calcium  carbonate (OSCAL) 1500 (600 Ca)  MG TABS tablet Take 1,500 mg by mouth daily.     Cholecalciferol (VITAMIN D3) 50 MCG (2000 UT) capsule Take 2,000 Units by mouth daily.     diltiazem  (TIAZAC ) 360 MG 24 hr capsule Take 1 capsule (360 mg total) by mouth daily. 90 capsule 3   mirtazapine  (REMERON ) 30 MG tablet Take 1 tablet (30 mg total) by mouth at bedtime. (Patient taking differently: Take 15 mg by mouth at bedtime.) 90 tablet 3   Multiple Vitamin (MULTIVITAMIN) tablet Take 1 tablet by mouth daily.     PARoxetine  (PAXIL ) 30 MG tablet TAKE 1 TABLET BY MOUTH EVERY DAY 90 tablet 3   relugolix  (ORGOVYX ) 120 MG tablet Take 1 tablet (120 mg total) by mouth daily. 30 tablet 11   triamcinolone  ointment (KENALOG ) 0.5 % apply TO SKIN 2 TIMES DAILY 30 g 0  No current facility-administered medications on file prior to visit.    Review of Systems:  As per HPI- otherwise negative.   Physical Examination: Vitals:   06/15/24 1403  BP: 122/70  Pulse: 87  SpO2: 97%   Vitals:   06/15/24 1403  Weight: 174 lb 12.8 oz (79.3 kg)  Height: 5' 9 (1.753 m)   Body mass index is 25.81 kg/m. Ideal Body Weight: Weight in (lb) to have BMI = 25: 168.9  GEN: no acute distress.  normal weight, looks well HEENT: Atraumatic, Normocephalic.  Bilateral TM wnl, oropharynx normal.  PEERL,EOMI.   Ears and Nose: No external deformity. CV: RRR, No M/G/R. No JVD. No thrill. No extra heart sounds. PULM: CTA B, no wheezes, crackles, rhonchi. No retractions. No resp. distress. No accessory muscle use. ABD: S, NT, ND, +BS. EXTR: No c/c/e PSYCH: Normally interactive. Conversant.    Assessment and Plan: Elevated alkaline phosphatase level - Plan: Alkaline phosphatase, Alkaline phosphatase, bone specific, VITAMIN D  25 Hydroxy (Vit-D Deficiency, Fractures)  Medication monitoring encounter - Plan: DG Bone Density  Testosterone deficiency - Plan: DG Bone Density  Screening for diabetes mellitus - Plan: Hemoglobin A1c  Assessment & Plan Prostate  cancer on androgen deprivation therapy, planned for radiation Prostate cancer with progression on ADT for one month. Radiation therapy planned for November or December post-marker placement. ADT causing mild arthralgia, not requiring treatment. - Continue ADT. - He plans to start radiation therapy for November or December. He is feeling very positive and comfortable with his cancer treatment plan and we also have he will do just fine  Right lung nodules and enlarged mediastinal lymph nodes under surveillance Right lung nodules stable, mediastinal lymph nodes increased in size on PET scan. Continued surveillance recommended. - Order CT scan of the chest in January or February for surveillance.  Arthralgia possibly related to androgen deprivation therapy Mild arthralgia possibly related to ADT, not severe enough for treatment. - Reassess if symptoms worsen.  Vitamin D  deficiency on treatment Vitamin D  deficiency treated with 2000 IU daily. Alkaline phosphatase levels trending down, indicating improvement. Endocrinology suspects deficiency caused previous bone concerns. - Check alkaline phosphatase levels and send results to endocrinology. - Continue vitamin D  supplementation at 2000 IU daily.  Risk for osteoporosis due to androgen deprivation therapy Increased osteoporosis risk due to ADT. Calcium  supplements and weight-bearing exercises in place. Baseline bone density scan planned. - Schedule bone density scan for baseline assessment. - Continue calcium  supplementation. - Encourage continued weight-bearing exercises.  Signed Harlene Schroeder, MD  Addendum 9/30, received labs as below.  Message to patient Results for orders placed or performed in visit on 06/15/24  Alkaline phosphatase   Collection Time: 06/15/24  2:42 PM  Result Value Ref Range   Alkaline Phosphatase 141 (H) 39 - 117 U/L  VITAMIN D  25 Hydroxy (Vit-D Deficiency, Fractures)   Collection Time: 06/15/24  2:42 PM  Result  Value Ref Range   VITD 50.57 30.00 - 100.00 ng/mL  Hemoglobin A1c   Collection Time: 06/15/24  2:42 PM  Result Value Ref Range   Hgb A1c MFr Bld 6.2 4.6 - 6.5 %

## 2024-06-15 ENCOUNTER — Ambulatory Visit (INDEPENDENT_AMBULATORY_CARE_PROVIDER_SITE_OTHER): Admitting: Family Medicine

## 2024-06-15 ENCOUNTER — Encounter: Payer: Self-pay | Admitting: Family Medicine

## 2024-06-15 VITALS — BP 122/70 | HR 87 | Ht 69.0 in | Wt 174.8 lb

## 2024-06-15 DIAGNOSIS — Z5181 Encounter for therapeutic drug level monitoring: Secondary | ICD-10-CM

## 2024-06-15 DIAGNOSIS — R748 Abnormal levels of other serum enzymes: Secondary | ICD-10-CM | POA: Diagnosis not present

## 2024-06-15 DIAGNOSIS — E349 Endocrine disorder, unspecified: Secondary | ICD-10-CM | POA: Diagnosis not present

## 2024-06-15 DIAGNOSIS — Z131 Encounter for screening for diabetes mellitus: Secondary | ICD-10-CM | POA: Diagnosis not present

## 2024-06-15 DIAGNOSIS — Z23 Encounter for immunization: Secondary | ICD-10-CM | POA: Diagnosis not present

## 2024-06-15 DIAGNOSIS — R911 Solitary pulmonary nodule: Secondary | ICD-10-CM

## 2024-06-16 ENCOUNTER — Encounter: Payer: Self-pay | Admitting: Family Medicine

## 2024-06-16 LAB — VITAMIN D 25 HYDROXY (VIT D DEFICIENCY, FRACTURES): VITD: 50.57 ng/mL (ref 30.00–100.00)

## 2024-06-16 LAB — ALKALINE PHOSPHATASE: Alkaline Phosphatase: 141 U/L — ABNORMAL HIGH (ref 39–117)

## 2024-06-16 LAB — HEMOGLOBIN A1C: Hgb A1c MFr Bld: 6.2 % (ref 4.6–6.5)

## 2024-06-18 LAB — ALKALINE PHOSPHATASE, BONE SPECIFIC: ALKALINE PHOSPHATASE, BONE SPECIFIC: 17.4 ug/L

## 2024-06-19 ENCOUNTER — Ambulatory Visit: Payer: Self-pay | Admitting: Family Medicine

## 2024-06-26 ENCOUNTER — Ambulatory Visit: Payer: Self-pay | Admitting: Urology

## 2024-06-26 ENCOUNTER — Other Ambulatory Visit: Payer: Self-pay | Admitting: Urology

## 2024-06-26 DIAGNOSIS — C61 Malignant neoplasm of prostate: Secondary | ICD-10-CM

## 2024-06-29 ENCOUNTER — Telehealth: Payer: Self-pay | Admitting: Urology

## 2024-06-29 NOTE — Telephone Encounter (Signed)
 [facility] [phone] Ext [phone] Amy has procedure information and states she is going to send you a fax for clinical information.

## 2024-06-29 NOTE — Telephone Encounter (Signed)
 Fax received. Sending requested information back.

## 2024-06-30 ENCOUNTER — Other Ambulatory Visit: Payer: Self-pay | Admitting: Urology

## 2024-06-30 DIAGNOSIS — C61 Malignant neoplasm of prostate: Secondary | ICD-10-CM

## 2024-07-08 ENCOUNTER — Encounter: Payer: Self-pay | Admitting: Urology

## 2024-07-08 ENCOUNTER — Ambulatory Visit: Admitting: Urology

## 2024-07-08 ENCOUNTER — Ambulatory Visit (INDEPENDENT_AMBULATORY_CARE_PROVIDER_SITE_OTHER): Admitting: Urology

## 2024-07-08 VITALS — BP 120/76 | HR 90

## 2024-07-08 DIAGNOSIS — C61 Malignant neoplasm of prostate: Secondary | ICD-10-CM | POA: Diagnosis not present

## 2024-07-08 DIAGNOSIS — R8290 Unspecified abnormal findings in urine: Secondary | ICD-10-CM | POA: Diagnosis not present

## 2024-07-08 LAB — MICROSCOPIC EXAMINATION

## 2024-07-08 LAB — URINALYSIS, ROUTINE W REFLEX MICROSCOPIC
Bilirubin, UA: NEGATIVE
Glucose, UA: NEGATIVE
Ketones, UA: NEGATIVE
Nitrite, UA: NEGATIVE
Protein,UA: NEGATIVE
RBC, UA: NEGATIVE
Specific Gravity, UA: 1.02 (ref 1.005–1.030)
Urobilinogen, Ur: 0.2 mg/dL (ref 0.2–1.0)
pH, UA: 7 (ref 5.0–7.5)

## 2024-07-08 NOTE — H&P (View-Only) (Signed)
 Assessment: 1. Prostate cancer (HCC); high risk; GG 2,3,4   2. Abnormal urine findings     Plan: Continue Orgovyx   Urine culture sent today due to abnormal urinalysis  Procedure: The patient will be scheduled for placement of fiducial markers and SpaceOAR hydrogel at [facility] .  Surgical request is placed with the surgery schedulers and will be scheduled at the patient's/family request. Informed consent is given as documented below. Anesthesia: MAC  The patient does not have sleep apnea, history of MRSA, history of VRE, history of cardiac device requiring special anesthetic needs. Patient is stable and considered clear for surgical management in an outpatient ambulatory surgery setting as well as inpatient hospital setting.  Consent for Operation or Procedure: Provider Certification I hereby certify that the nature, purpose, benefits, usual and most frequent risks of, and alternatives to, the operation or procedure have been explained to the patient (or person authorized to sign for the patient) either by me as responsible physician or by the provider who is to perform the operation or procedure. Time spent such that the patient/family has had an opportunity to ask questions, and that those questions have been answered. The patient or the patient's representative has been advised that selected tasks may be performed by assistants to the primary health care provider(s). I believe that the patient (or person authorized to sign for the patient) understands what has been explained, and has consented to the operation or procedure. No guarantees were implied or made.   Chief Complaint:  Chief Complaint  Patient presents with   Prostate Cancer    History of Present Illness:  Brian [name] is a 73 y.o. male who is seen for continued evaluation of low risk prostate cancer. He was also found to have an elevated alkaline phosphatase of 143 on routine laboratory testing in September 2024.  The  bone specific alkaline phosphatase was elevated.  PSA results: 3/20 2.4 3/21 2.0 3/22 2.6 10/22 1.95 9/23 2.57 4/24 2.5 10/24 4.20  Iso PSA 11/24:  PSA 4.0 with isoPSA 11.7 (elevated)  No prior history of PSA elevation.  No prior prostate biopsy.  No history of UTIs or prostatitis. CT from 4/24 showed prostate enlargement with central calcifications. No dysuria or gross hematuria.  No bone pain or weight loss. IPSS = 6.  He has a history of gross and microscopic hematuria.  He had a history of intermittent gross hematuria.  He reported that his urine was intermittently brown in color, occurring every 2-3 days.  No associated flank pain or dysuria.  No prior episodes of gross hematuria. Dipstick urinalysis showed large blood and small leukocyte esterase.  Urine culture grew <10K colonies.  He was evaluated with a CT abdomen and pelvis with contrast on 01/12/2023 which showed no renal or ureteral calculi, no renal mass, no evidence of obstruction.  He has a remote history of kidney stones with 2 stone episodes in the early 1980s. No history of tobacco use.  He has a family history of kidney and prostate cancer with his father. Urinalysis from 01/16/2023 showed >30 RBCs. Review of the CT scan from 01/12/2023 showed a 2 mm calculus in the proximal left ureter without obstruction. He reported passing a stone following the CT scan.  He had no further episodes of gross hematuria.  He was scheduled to undergo cystoscopy in May 2024 but elected to postpone this evaluation. Repeat urinalysis from July 2024 did not show any evidence of hematuria.  He underwent transrectal ultrasound and biopsy  of the prostate on 10/02/23. PSA: 4.0 ng/ml TRUS volume:  35 ml  PSA density:  0.11 Biopsy results:             Gleason score: 3+ 3 = 6            # positive cores: 2/6 on right    1/6 on left            Location of cancer: apex, mid gland, and base  Complications after biopsy: none Patient without  significant LUTS.    IPSS = 7 Patient without erectile dysfunction.    Decipher test:  high risk. PSA 4/25:  4.1 Prostate MRI from 6/25 showed a PI-RADS 4 lesion on the right apex, vol 34 ml. He subsequently underwent a fusion biopsy on 03/30/2024. Biopsy results: Gleason 4+4 = 8 from right ROI, Gleason 3+4 = 7 from right mid, Gleason 4+3 = 7 from right apex. PSMA PET scan from 04/16/2024 showed abnormal uptake on right side of prostate but no evidence of metastatic disease. He was evaluated by radiation oncology for treatment of high risk prostate cancer.  He has elected to proceed with external beam therapy along with long-term ADT.  He was started on ADT with Orgovyx  on 05/27/2024. He presents today to arrange placement of fiducial markers and SpaceOAR hydrogel on 07/21/2024. He is doing well with the Orgovyx .  He has experienced mild hot flashes.  No other side effects.  No dysuria or gross hematuria.  Portions of the above documentation were copied from a prior visit for review purposes only.   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   Depression 2000   Duodenitis 10/10/2015   Elevated PSA    PAF (paroxysmal atrial fibrillation) (HCC) 09/2015   lone AF in setting of sepsis   Sepsis secondary to UTI (HCC) 10/10/2015    Past Surgical History:  Past Surgical History:  Procedure Laterality Date   APPENDECTOMY  1972   ATRIAL FIBRILLATION ABLATION N/A 05/08/2022   Procedure: ATRIAL FIBRILLATION ABLATION;  Surgeon: Inocencio Soyla Lunger, MD;  Location: [facility];  Service: Cardiovascular;  Laterality: N/A;   CARDIOVASCULAR STRESS TEST  01/22/2014   GXT - Ex 10 min (10.9 METs), HR 164 = 103% MPHR. No ST-T wave chagnes or Sx.  LOW RISK  --[facility] TM Score 10    HOLTER MONITOR  10/2015   Mostly normal sinus rhythm. Recommended avoiding stimulants, energy drinks and  decongestants. No signs of A. fib.SABRA PVCs noted in pairs, singles, bigeminy and trigeminy. No A. fib.   JOINT REPLACEMENT  2024   left knee   PROSTATE BIOPSY     TRANSTHORACIC ECHOCARDIOGRAM  02/2014   Normal LV size and function.  EF 68%.  Mild MR.  Mild TR.  No pulmonary hypertension.  Blood normal.   VASECTOMY      Allergies:  Allergies  Allergen Reactions   Sulfa Antibiotics     childhood    Family History:  Family History  Problem Relation Age of Onset   Cancer Mother    Cancer Father    Heart disease Father    Stroke Father    Arthritis Father    Kidney disease Father    Cancer Maternal Grandmother    Stroke Maternal Grandfather    Coronary artery disease Other    Cancer Other  Stroke Other    Arrhythmia Other     Social History:  Social History   Tobacco Use   Smoking status: Never   Smokeless tobacco: Never   Tobacco comments:    Never smoke 02/22/22  Vaping Use   Vaping status: Never Used  Substance Use Topics   Alcohol use: No    Alcohol/week: 0.0 standard drinks of alcohol   Drug use: No    ROS: Constitutional:  Negative for fever, chills, weight loss CV: Negative for chest pain, previous MI, hypertension Respiratory:  Negative for shortness of breath, wheezing, sleep apnea, frequent cough GI:  Negative for nausea, vomiting, bloody stool, GERD   Physical exam: BP 120/76   Pulse 90  GENERAL APPEARANCE:  Well appearing, well developed, well nourished, NAD HEENT:  Atraumatic, normocephalic, oropharynx clear NECK:  Supple without lymphadenopathy or thyromegaly ABDOMEN:  Soft, non-tender, no masses EXTREMITIES:  Moves all extremities well, without clubbing, cyanosis, or edema NEUROLOGIC:  Alert and oriented x 3, normal gait, CN II-XII grossly intact MENTAL STATUS:  appropriate BACK:  Non-tender to palpation, No CVAT SKIN:  Warm, dry, and intact  Results: U/A: 6-10 WBCs, 0-2 RBCs, moderate bacteria

## 2024-07-08 NOTE — Progress Notes (Signed)
 Assessment: 1. Prostate cancer (HCC); high risk; GG 2,3,4   2. Abnormal urine findings     Plan: Continue Orgovyx   Urine culture sent today due to abnormal urinalysis  Procedure: The patient will be scheduled for placement of fiducial markers and SpaceOAR hydrogel at [facility] .  Surgical request is placed with the surgery schedulers and will be scheduled at the patient's/family request. Informed consent is given as documented below. Anesthesia: MAC  The patient does not have sleep apnea, history of MRSA, history of VRE, history of cardiac device requiring special anesthetic needs. Patient is stable and considered clear for surgical management in an outpatient ambulatory surgery setting as well as inpatient hospital setting.  Consent for Operation or Procedure: Provider Certification I hereby certify that the nature, purpose, benefits, usual and most frequent risks of, and alternatives to, the operation or procedure have been explained to the patient (or person authorized to sign for the patient) either by me as responsible physician or by the provider who is to perform the operation or procedure. Time spent such that the patient/family has had an opportunity to ask questions, and that those questions have been answered. The patient or the patient's representative has been advised that selected tasks may be performed by assistants to the primary health care provider(s). I believe that the patient (or person authorized to sign for the patient) understands what has been explained, and has consented to the operation or procedure. No guarantees were implied or made.   Chief Complaint:  Chief Complaint  Patient presents with   Prostate Cancer    History of Present Illness:  Brian [name] is a 73 y.o. male who is seen for continued evaluation of low risk prostate cancer. He was also found to have an elevated alkaline phosphatase of 143 on routine laboratory testing in September 2024.  The  bone specific alkaline phosphatase was elevated.  PSA results: 3/20 2.4 3/21 2.0 3/22 2.6 10/22 1.95 9/23 2.57 4/24 2.5 10/24 4.20  Iso PSA 11/24:  PSA 4.0 with isoPSA 11.7 (elevated)  No prior history of PSA elevation.  No prior prostate biopsy.  No history of UTIs or prostatitis. CT from 4/24 showed prostate enlargement with central calcifications. No dysuria or gross hematuria.  No bone pain or weight loss. IPSS = 6.  He has a history of gross and microscopic hematuria.  He had a history of intermittent gross hematuria.  He reported that his urine was intermittently brown in color, occurring every 2-3 days.  No associated flank pain or dysuria.  No prior episodes of gross hematuria. Dipstick urinalysis showed large blood and small leukocyte esterase.  Urine culture grew <10K colonies.  He was evaluated with a CT abdomen and pelvis with contrast on 01/12/2023 which showed no renal or ureteral calculi, no renal mass, no evidence of obstruction.  He has a remote history of kidney stones with 2 stone episodes in the early 1980s. No history of tobacco use.  He has a family history of kidney and prostate cancer with his father. Urinalysis from 01/16/2023 showed >30 RBCs. Review of the CT scan from 01/12/2023 showed a 2 mm calculus in the proximal left ureter without obstruction. He reported passing a stone following the CT scan.  He had no further episodes of gross hematuria.  He was scheduled to undergo cystoscopy in May 2024 but elected to postpone this evaluation. Repeat urinalysis from July 2024 did not show any evidence of hematuria.  He underwent transrectal ultrasound and biopsy  of the prostate on 10/02/23. PSA: 4.0 ng/ml TRUS volume:  35 ml  PSA density:  0.11 Biopsy results:             Gleason score: 3+ 3 = 6            # positive cores: 2/6 on right    1/6 on left            Location of cancer: apex, mid gland, and base  Complications after biopsy: none Patient without  significant LUTS.    IPSS = 7 Patient without erectile dysfunction.    Decipher test:  high risk. PSA 4/25:  4.1 Prostate MRI from 6/25 showed a PI-RADS 4 lesion on the right apex, vol 34 ml. He subsequently underwent a fusion biopsy on 03/30/2024. Biopsy results: Gleason 4+4 = 8 from right ROI, Gleason 3+4 = 7 from right mid, Gleason 4+3 = 7 from right apex. PSMA PET scan from 04/16/2024 showed abnormal uptake on right side of prostate but no evidence of metastatic disease. He was evaluated by radiation oncology for treatment of high risk prostate cancer.  He has elected to proceed with external beam therapy along with long-term ADT.  He was started on ADT with Orgovyx  on 05/27/2024. He presents today to arrange placement of fiducial markers and SpaceOAR hydrogel on 07/21/2024. He is doing well with the Orgovyx .  He has experienced mild hot flashes.  No other side effects.  No dysuria or gross hematuria.  Portions of the above documentation were copied from a prior visit for review purposes only.   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   Depression 2000   Duodenitis 10/10/2015   Elevated PSA    PAF (paroxysmal atrial fibrillation) (HCC) 09/2015   lone AF in setting of sepsis   Sepsis secondary to UTI (HCC) 10/10/2015    Past Surgical History:  Past Surgical History:  Procedure Laterality Date   APPENDECTOMY  1972   ATRIAL FIBRILLATION ABLATION N/A 05/08/2022   Procedure: ATRIAL FIBRILLATION ABLATION;  Surgeon: Inocencio Soyla Lunger, MD;  Location: [facility];  Service: Cardiovascular;  Laterality: N/A;   CARDIOVASCULAR STRESS TEST  01/22/2014   GXT - Ex 10 min (10.9 METs), HR 164 = 103% MPHR. No ST-T wave chagnes or Sx.  LOW RISK  --[facility] TM Score 10    HOLTER MONITOR  10/2015   Mostly normal sinus rhythm. Recommended avoiding stimulants, energy drinks and  decongestants. No signs of A. fib.SABRA PVCs noted in pairs, singles, bigeminy and trigeminy. No A. fib.   JOINT REPLACEMENT  2024   left knee   PROSTATE BIOPSY     TRANSTHORACIC ECHOCARDIOGRAM  02/2014   Normal LV size and function.  EF 68%.  Mild MR.  Mild TR.  No pulmonary hypertension.  Blood normal.   VASECTOMY      Allergies:  Allergies  Allergen Reactions   Sulfa Antibiotics     childhood    Family History:  Family History  Problem Relation Age of Onset   Cancer Mother    Cancer Father    Heart disease Father    Stroke Father    Arthritis Father    Kidney disease Father    Cancer Maternal Grandmother    Stroke Maternal Grandfather    Coronary artery disease Other    Cancer Other  Stroke Other    Arrhythmia Other     Social History:  Social History   Tobacco Use   Smoking status: Never   Smokeless tobacco: Never   Tobacco comments:    Never smoke 02/22/22  Vaping Use   Vaping status: Never Used  Substance Use Topics   Alcohol use: No    Alcohol/week: 0.0 standard drinks of alcohol   Drug use: No    ROS: Constitutional:  Negative for fever, chills, weight loss CV: Negative for chest pain, previous MI, hypertension Respiratory:  Negative for shortness of breath, wheezing, sleep apnea, frequent cough GI:  Negative for nausea, vomiting, bloody stool, GERD   Physical exam: BP 120/76   Pulse 90  GENERAL APPEARANCE:  Well appearing, well developed, well nourished, NAD HEENT:  Atraumatic, normocephalic, oropharynx clear NECK:  Supple without lymphadenopathy or thyromegaly ABDOMEN:  Soft, non-tender, no masses EXTREMITIES:  Moves all extremities well, without clubbing, cyanosis, or edema NEUROLOGIC:  Alert and oriented x 3, normal gait, CN II-XII grossly intact MENTAL STATUS:  appropriate BACK:  Non-tender to palpation, No CVAT SKIN:  Warm, dry, and intact  Results: U/A: 6-10 WBCs, 0-2 RBCs, moderate bacteria

## 2024-07-09 LAB — URINE CULTURE: Organism ID, Bacteria: NO GROWTH

## 2024-07-10 ENCOUNTER — Ambulatory Visit: Payer: Self-pay | Admitting: Urology

## 2024-07-14 ENCOUNTER — Other Ambulatory Visit: Payer: Self-pay | Admitting: Family Medicine

## 2024-07-14 DIAGNOSIS — F32A Depression, unspecified: Secondary | ICD-10-CM

## 2024-07-15 ENCOUNTER — Other Ambulatory Visit: Payer: Self-pay | Admitting: Family Medicine

## 2024-07-15 DIAGNOSIS — E7800 Pure hypercholesterolemia, unspecified: Secondary | ICD-10-CM

## 2024-07-16 ENCOUNTER — Telehealth: Payer: Self-pay

## 2024-07-16 ENCOUNTER — Ambulatory Visit (HOSPITAL_BASED_OUTPATIENT_CLINIC_OR_DEPARTMENT_OTHER)
Admission: RE | Admit: 2024-07-16 | Discharge: 2024-07-16 | Disposition: A | Discharge status: Home / Self Care | Source: Ambulatory Visit | Attending: Family Medicine | Admitting: Family Medicine

## 2024-07-16 ENCOUNTER — Telehealth: Payer: Self-pay | Admitting: Urology

## 2024-07-16 DIAGNOSIS — M8589 Other specified disorders of bone density and structure, multiple sites: Secondary | ICD-10-CM | POA: Insufficient documentation

## 2024-07-16 DIAGNOSIS — Z5181 Encounter for therapeutic drug level monitoring: Secondary | ICD-10-CM | POA: Diagnosis present

## 2024-07-16 DIAGNOSIS — E349 Endocrine disorder, unspecified: Secondary | ICD-10-CM | POA: Diagnosis present

## 2024-07-16 NOTE — Telephone Encounter (Signed)
 Spoke with pt in reference to surgery questions. Pt stated he has not heard from pre-admit and was unsure of what to do for 11/4 surgery. Made pt aware I would follow up with pre-admit and give him a call back.   Spoke with Caitlin in pre-admit. Reche stated she start Tuesday's schedule today and pt should hear from her today or tomorrow.   Spoke with pt again and made aware Caitlin will be calling today or tomorrow. Pt voiced understanding.

## 2024-07-16 NOTE — Telephone Encounter (Signed)
 Pt called about procedure next Tuesday has a couple of questions. Please advise.

## 2024-07-17 ENCOUNTER — Encounter (HOSPITAL_COMMUNITY): Payer: Self-pay | Admitting: Urology

## 2024-07-17 NOTE — Progress Notes (Signed)
 Spoke w/ via phone for pre-op  interview--- Brian [name] needs dos----  NONE       [name] results------ COVID test -----patient states asymptomatic no test needed Arrive at -------1200 NPO after MN NO Solid Food.  Clear liquids from MN until---1100 Pre-Surgery Ensure or G2:  Med rec completed Medications to take morning of surgery -----Diltiazem  Diabetic medication -----  GLP1 agonist last dose: GLP1 instructions:  Patient instructed no nail polish to be worn day of surgery Patient instructed to bring photo id and insurance card day of surgery Patient aware to have Driver (ride ) / caregiver    for 24 hours after surgery - [name] [facility] Patient Special Instructions ----- Fleet enema per surgeons instructions. Pre-Op  special Instructions -----NS IV pt has Rocephin  ordered.  Patient verbalized understanding of instructions that were given at this phone interview. Patient denies chest pain, sob, fever, cough at the interview.

## 2024-07-19 ENCOUNTER — Encounter: Payer: Self-pay | Admitting: Family Medicine

## 2024-07-20 ENCOUNTER — Encounter: Payer: Self-pay | Admitting: Family Medicine

## 2024-07-20 DIAGNOSIS — M8580 Other specified disorders of bone density and structure, unspecified site: Secondary | ICD-10-CM | POA: Insufficient documentation

## 2024-07-20 NOTE — Telephone Encounter (Signed)
 I think it is okay to hold off for now to start fosamax.

## 2024-07-21 ENCOUNTER — Encounter (HOSPITAL_COMMUNITY): Payer: Self-pay | Admitting: Urology

## 2024-07-21 ENCOUNTER — Other Ambulatory Visit: Payer: Self-pay

## 2024-07-21 ENCOUNTER — Ambulatory Visit (HOSPITAL_COMMUNITY): Admitting: Anesthesiology

## 2024-07-21 ENCOUNTER — Ambulatory Visit (HOSPITAL_COMMUNITY)
Admission: RE | Admit: 2024-07-21 | Discharge: 2024-07-21 | Disposition: A | Discharge status: Home / Self Care | Attending: Urology | Admitting: Urology

## 2024-07-21 ENCOUNTER — Encounter (HOSPITAL_COMMUNITY)
Admission: RE | Disposition: A | Discharge status: Home / Self Care | Payer: Self-pay | Source: Home / Self Care | Attending: Urology

## 2024-07-21 DIAGNOSIS — N400 Enlarged prostate without lower urinary tract symptoms: Secondary | ICD-10-CM | POA: Diagnosis not present

## 2024-07-21 DIAGNOSIS — Z87442 Personal history of urinary calculi: Secondary | ICD-10-CM | POA: Insufficient documentation

## 2024-07-21 DIAGNOSIS — I2510 Atherosclerotic heart disease of native coronary artery without angina pectoris: Secondary | ICD-10-CM | POA: Insufficient documentation

## 2024-07-21 DIAGNOSIS — I480 Paroxysmal atrial fibrillation: Secondary | ICD-10-CM | POA: Insufficient documentation

## 2024-07-21 DIAGNOSIS — C61 Malignant neoplasm of prostate: Secondary | ICD-10-CM | POA: Insufficient documentation

## 2024-07-21 DIAGNOSIS — Z79899 Other long term (current) drug therapy: Secondary | ICD-10-CM | POA: Insufficient documentation

## 2024-07-21 DIAGNOSIS — Z8249 Family history of ischemic heart disease and other diseases of the circulatory system: Secondary | ICD-10-CM | POA: Diagnosis not present

## 2024-07-21 HISTORY — DX: Personal history of urinary calculi: Z87.442

## 2024-07-21 HISTORY — PX: SPACE OAR INSTILLATION: SHX6769

## 2024-07-21 HISTORY — DX: Cardiac arrhythmia, unspecified: I49.9

## 2024-07-21 HISTORY — PX: GOLD SEED IMPLANT: SHX6343

## 2024-07-21 SURGERY — INSERTION, GOLD SEEDS
Anesthesia: Monitor Anesthesia Care

## 2024-07-21 MED ORDER — OXYCODONE HCL 5 MG/5ML PO SOLN: 5.0000 mg | Freq: Once | ORAL | Status: DC | PRN

## 2024-07-21 MED ORDER — CHLORHEXIDINE GLUCONATE 0.12 % MT SOLN
15.0000 mL | Freq: Once | OROMUCOSAL | Status: AC
  Administered 2024-07-21: 15 mL via OROMUCOSAL

## 2024-07-21 MED ORDER — LIDOCAINE 2% (20 MG/ML) 5 ML SYRINGE
INTRAMUSCULAR | Status: DC | PRN
  Administered 2024-07-21: 100 mg via INTRAVENOUS

## 2024-07-21 MED ORDER — MIDAZOLAM HCL (PF) 2 MG/2ML IJ SOLN
INTRAMUSCULAR | Status: DC | PRN
  Administered 2024-07-21: 2 mg via INTRAVENOUS

## 2024-07-21 MED ORDER — SODIUM CHLORIDE 0.9 % IV SOLN
2.0000 g | INTRAVENOUS | Status: AC
  Administered 2024-07-21: 2 g via INTRAVENOUS
  Filled 2024-07-21: qty 20

## 2024-07-21 MED ORDER — PROPOFOL 1000 MG/100ML IV EMUL
INTRAVENOUS | Status: AC
  Filled 2024-07-21: qty 100

## 2024-07-21 MED ORDER — MIDAZOLAM HCL 2 MG/2ML IJ SOLN
INTRAMUSCULAR | Status: AC
  Filled 2024-07-21: qty 2

## 2024-07-21 MED ORDER — FENTANYL CITRATE (PF) 100 MCG/2ML IJ SOLN
INTRAMUSCULAR | Status: AC
  Filled 2024-07-21: qty 2

## 2024-07-21 MED ORDER — ONDANSETRON HCL 4 MG/2ML IJ SOLN: 4.0000 mg | Freq: Once | INTRAMUSCULAR | Status: DC | PRN

## 2024-07-21 MED ORDER — ACETAMINOPHEN 10 MG/ML IV SOLN: 1000.0000 mg | Freq: Once | INTRAVENOUS | Status: DC | PRN

## 2024-07-21 MED ORDER — FENTANYL CITRATE (PF) 100 MCG/2ML IJ SOLN
INTRAMUSCULAR | Status: DC | PRN
  Administered 2024-07-21: 100 ug via INTRAVENOUS

## 2024-07-21 MED ORDER — LIDOCAINE 2% (20 MG/ML) 5 ML SYRINGE
INTRAMUSCULAR | Status: AC
  Filled 2024-07-21: qty 5

## 2024-07-21 MED ORDER — ONDANSETRON HCL 4 MG/2ML IJ SOLN
INTRAMUSCULAR | Status: DC | PRN
  Administered 2024-07-21: 4 mg via INTRAVENOUS

## 2024-07-21 MED ORDER — CHLORHEXIDINE GLUCONATE 0.12 % MT SOLN
OROMUCOSAL | Status: AC
  Filled 2024-07-21: qty 15

## 2024-07-21 MED ORDER — LIDOCAINE HCL 2 % IJ SOLN
INTRAMUSCULAR | Status: DC | PRN
  Administered 2024-07-21: 10 mL

## 2024-07-21 MED ORDER — DEXAMETHASONE SOD PHOSPHATE PF 10 MG/ML IJ SOLN
INTRAMUSCULAR | Status: DC | PRN
  Administered 2024-07-21: 10 mg via INTRAVENOUS

## 2024-07-21 MED ORDER — SODIUM CHLORIDE (PF) 0.9 % IJ SOLN
INTRAMUSCULAR | Status: AC
  Filled 2024-07-21: qty 10

## 2024-07-21 MED ORDER — OXYCODONE HCL 5 MG PO TABS: 5.0000 mg | ORAL_TABLET | Freq: Once | ORAL | Status: DC | PRN

## 2024-07-21 MED ORDER — PROPOFOL 10 MG/ML IV BOLUS
INTRAVENOUS | Status: DC | PRN
  Administered 2024-07-21: 75 ug/kg/min via INTRAVENOUS
  Administered 2024-07-21: 50 mg via INTRAVENOUS

## 2024-07-21 MED ORDER — ORAL CARE MOUTH RINSE: 15.0000 mL | Freq: Once | OROMUCOSAL | Status: AC

## 2024-07-21 MED ORDER — SODIUM CHLORIDE (PF) 0.9 % IJ SOLN
INTRAMUSCULAR | Status: DC | PRN
  Administered 2024-07-21: 5 mL

## 2024-07-21 MED ORDER — LIDOCAINE HCL 2 % IJ SOLN
INTRAMUSCULAR | Status: AC
  Filled 2024-07-21: qty 20

## 2024-07-21 MED ORDER — FENTANYL CITRATE (PF) 100 MCG/2ML IJ SOLN: 25.0000 ug | INTRAMUSCULAR | Status: DC | PRN

## 2024-07-21 MED ORDER — SODIUM CHLORIDE 0.9 % IV SOLN: INTRAVENOUS | Status: DC

## 2024-07-21 SURGICAL SUPPLY — 20 items
BLADE CLIPPER SENSICLIP SURGIC (BLADE) ×1 IMPLANT
BLANKET WARM UPPER BOD BAIR (MISCELLANEOUS) ×1 IMPLANT
CNTNR URN SCR LID CUP LEK RST (MISCELLANEOUS) ×1 IMPLANT
COVER BACK TABLE 60X90IN (DRAPES) ×1 IMPLANT
DRSG TEGADERM 4X4.5 CHG (GAUZE/BANDAGES/DRESSINGS) IMPLANT
DRSG TEGADERM 4X4.75 (GAUZE/BANDAGES/DRESSINGS) ×1 IMPLANT
DRSG TEGADERM 8X12 (GAUZE/BANDAGES/DRESSINGS) ×1 IMPLANT
GAUZE SPONGE 4X4 12PLY STRL (GAUZE/BANDAGES/DRESSINGS) ×1 IMPLANT
GLOVE BIO SURGEON STRL SZ8 (GLOVE) ×1 IMPLANT
IMPL SPACEOAR SYSTEM 10ML (Spacer) IMPLANT
MARKER GOLD PRELOAD 1.2X3 (Urological Implant) ×1 IMPLANT
MARKER SKIN DUAL TIP RULER LAB (MISCELLANEOUS) ×1 IMPLANT
NDL SPNL 22GX3.5 QUINCKE BK (NEEDLE) ×1 IMPLANT
NEEDLE SPNL 22GX3.5 QUINCKE BK (NEEDLE) ×1 IMPLANT
SHEATH ULTRASOUND LTX NONSTRL (SHEATH) IMPLANT
SLEEVE SCD COMPRESS KNEE MED (STOCKING) ×1 IMPLANT
SYR 10ML LL (SYRINGE) IMPLANT
SYR CONTROL 10ML LL (SYRINGE) ×1 IMPLANT
TOWEL OR 17X24 6PK STRL BLUE (TOWEL DISPOSABLE) ×1 IMPLANT
UNDERPAD 30X36 HEAVY ABSORB (UNDERPADS AND DIAPERS) ×1 IMPLANT

## 2024-07-21 NOTE — Discharge Instructions (Signed)

## 2024-07-21 NOTE — Transfer of Care (Signed)
 Immediate Anesthesia Transfer of Care Note  Patient: Brian [name]  Procedure(s) Performed: INSERTION, GOLD SEEDS INJECTION, HYDROGEL SPACER  Patient Location: PACU  Anesthesia Type:MAC  Level of Consciousness: awake, alert , and oriented  Airway & Oxygen Therapy: Patient Spontanous Breathing and Patient connected to face mask oxygen  Post-op Assessment: Report given to RN and Post -op Vital signs reviewed and unstable, Anesthesiologist notified  Post vital signs: Reviewed and stable  Last Vitals:  Vitals Value Taken Time  BP 98/71 07/21/24 13:22  Temp    Pulse 65 07/21/24 13:24  Resp 7 07/21/24 13:24  SpO2 94 % 07/21/24 13:24  Vitals shown include unfiled device data.  Last Pain:  Vitals:   07/21/24 1206  TempSrc: Oral  PainSc: 0-No pain      Patients Stated Pain Goal: 3 (07/21/24 1206)  Complications: No notable events documented.

## 2024-07-21 NOTE — Interval H&P Note (Signed)
 History and Physical Interval Note:  07/21/2024 12:43 PM  Brian [name]  has presented today for surgery, with the diagnosis of Malignant neoplasm of prostate.  The various methods of treatment have been discussed with the patient and family. After consideration of risks, benefits and other options for treatment, the patient has consented to  Procedure(s): INSERTION, GOLD SEEDS (N/A) INJECTION, HYDROGEL SPACER (N/A) as a surgical intervention.  The patient's history has been reviewed, patient examined, no change in status, stable for surgery.  I have reviewed the patient's chart and labs.  Questions were answered to the patient's satisfaction.     Adine Manly

## 2024-07-21 NOTE — Anesthesia Preprocedure Evaluation (Addendum)
 Anesthesia Evaluation  Patient identified by MRN, date of birth, ID band Patient awake    Reviewed: Allergy & Precautions, NPO status , Patient's Chart, lab work & pertinent test results, reviewed documented beta blocker date and time   History of Anesthesia Complications Negative for: history of anesthetic complications  Airway Mallampati: III  TM Distance: >3 FB     Dental no notable dental hx.    Pulmonary neg COPD   breath sounds clear to auscultation       Cardiovascular + CAD  + dysrhythmias Atrial Fibrillation  Rhythm:Regular Rate:Normal     Neuro/Psych neg Seizures PSYCHIATRIC DISORDERS  Depression       GI/Hepatic ,neg GERD  ,,(+) neg Cirrhosis        Endo/Other    Renal/GU Renal disease     Musculoskeletal  (+) Arthritis ,    Abdominal   Peds  Hematology   Anesthesia Other Findings   Reproductive/Obstetrics                              Anesthesia Physical Anesthesia Plan  ASA: 2  Anesthesia Plan: MAC   Post-op Pain Management:    Induction: Intravenous  PONV Risk Score and Plan: 1 and Ondansetron  and Propofol  infusion  Airway Management Planned: Natural Airway and Simple Face Mask  Additional Equipment:   Intra-op Plan:   Post-operative Plan:   Informed Consent: I have reviewed the patients History and Physical, chart, labs and discussed the procedure including the risks, benefits and alternatives for the proposed anesthesia with the patient or authorized representative who has indicated his/her understanding and acceptance.     Dental advisory given  Plan Discussed with: CRNA  Anesthesia Plan Comments:         Anesthesia Quick Evaluation

## 2024-07-21 NOTE — Anesthesia Postprocedure Evaluation (Signed)
 Anesthesia Post Note  Patient: Brian [name]  Procedure(s) Performed: INSERTION, GOLD SEEDS INJECTION, HYDROGEL SPACER     Patient location during evaluation: PACU Anesthesia Type: MAC Level of consciousness: awake and alert Pain management: pain level controlled Vital Signs Assessment: post-procedure vital signs reviewed and stable Respiratory status: spontaneous breathing, nonlabored ventilation, respiratory function stable and patient connected to nasal cannula oxygen Cardiovascular status: stable and blood pressure returned to baseline Postop Assessment: no apparent nausea or vomiting Anesthetic complications: no   No notable events documented.  Last Vitals:  Vitals:   07/21/24 1400 07/21/24 1415  BP: 121/74 112/77  Pulse: 63 66  Resp: 13 14  Temp:    SpO2: 94% 95%    Last Pain:  Vitals:   07/21/24 1400  TempSrc:   PainSc: 0-No pain                 [name] Cornea

## 2024-07-21 NOTE — Op Note (Signed)
 OPERATIVE NOTE   Patient Name: Brian [name]  MRN: [id number]   Date of Procedure: 07/21/24    Preoperative diagnosis:  Prostate cancer  Postoperative diagnosis:  Prostate cancer  Procedure:  Placement of fiducial markers in prostate Placement of SpaceOAR hydrogel  Attending: Adine DOROTHA Manly, MD  Anesthesia: MAC with local  Estimated blood loss:  minimal  Fluids: Per anesthesia record  Antibiotics: Rocephin  1 gm IV  Indications:  73 year old male with recent diagnosis of high risk prostate cancer presents for placement of fiducial markers in the prostate and SpaceOAR insertion in preparation for radiation therapy.  He is currently on ADT with Orgovyx  which he is tolerating well.  The procedure fluting potential risk has been discussed with the patient in detail.  He understands wishes to proceed as described.  Description of Procedure:  The patient was taken to the operating room suite and properly identified. Preoperative antibiotics were administered.  After successful induction of MAC anesthesia, he was placed in the dorsal lithotomy position, and prepped and draped in the usual sterile fashion. A preoperative time out was performed.  Next, transrectal ultrasonography was utilized to visualize the prostate. 10 cc of 0.25% bupivacaine was then used to infiltrate the subcuateous tissue of the perineum and an additional 10 cc was injected into the lateral apical tissue surrounding the prostate for a periprostatic nerve block.  Three gold fiducial markers were then placed into the prostate via transperineal needles under ultrasound guidance at the right apex, right base, and left mid gland under direct ultrasound guidance.  A site in the midline was then selected on the perineum for placement of an 18 g needle with saline.  The needle was advanced above the rectum and below Denonvillier's fascia to the mid gland and confirmed to be in the midline on transverse imaging.  One cc  of saline was injected confirming appropriate expansion of this space.  A total of 5-10 cc of saline was then injected to open the space further bilaterally.  The saline syringe was then removed and the SpaceOAR hydrogel was injected with good distribution bilaterally. He tolerated the procedure well and without complications. He was able to be awakened and transferred to the PACU in stable condition.   Complications: None  Condition: Stable, transferred to PACU  Plan:  Discharge to home. Follow-up with radiation oncology as scheduled.

## 2024-07-21 NOTE — Anesthesia Procedure Notes (Signed)
 Procedure Name: MAC Date/Time: 07/21/2024 1:05 PM  Performed by: Obadiah Reyes BROCKS, CRNAPre-anesthesia Checklist: Patient identified, Emergency Drugs available, Suction available, Patient being monitored and Timeout performed Patient Re-evaluated:Patient Re-evaluated prior to induction Oxygen Delivery Method: Simple face mask Preoxygenation: Pre-oxygenation with 100% oxygen Induction Type: IV induction Airway Equipment and Method: Oral airway

## 2024-07-22 ENCOUNTER — Encounter (HOSPITAL_COMMUNITY): Payer: Self-pay | Admitting: Urology

## 2024-07-22 ENCOUNTER — Telehealth: Payer: Self-pay | Admitting: *Deleted

## 2024-07-22 NOTE — Telephone Encounter (Signed)
 Called patient to remind of sim for 07-23-24- arrival time- 2:15 pm @ [facility], informed patient to arrive with a full bladder, reminded patient of MRI for 07-23-24- arrival time- 5 pm  @ [facility], spoke with patient and he is aware of these appts. and the instructions

## 2024-07-22 NOTE — Progress Notes (Incomplete)
  Radiation Oncology         [phone] ________________________________  Name: Garreth Burnsworth MRN: [id number]  Date: 07/23/2024  DOB: [birth year]  SIMULATION AND TREATMENT PLANNING NOTE    ICD-10-CM   1. Prostate cancer (HCC); PSA 4.0; cT1cNxMx; GG 1; low risk disease  C61       DIAGNOSIS:  73 y.o. gentleman with Stage T1c adenocarcinoma of the prostate with Gleason Score of 4+4, and PSA of 4.1.   NARRATIVE:  The patient was brought to the CT Simulation planning suite.  Identity was confirmed.  All relevant records and images related to the planned course of therapy were reviewed.  The patient freely provided informed written consent to proceed with treatment after reviewing the details related to the planned course of therapy. The consent form was witnessed and verified by the simulation staff.  Then, the patient was set-up in a stable reproducible supine position for radiation therapy.  A vacuum lock pillow device was custom fabricated to position his legs in a reproducible immobilized position.  Then, I performed a urethrogram under sterile conditions to identify the prostatic apex.  CT images were obtained.  Surface markings were placed.  The CT images were loaded into the planning software.  Then the prostate target and avoidance structures including the rectum, bladder, bowel and hips were contoured.  Treatment planning then occurred.  The radiation prescription was entered and confirmed.  A total of one complex treatment devices was fabricated. I have requested : Intensity Modulated Radiotherapy (IMRT) is medically necessary for this case for the following reason:  Rectal sparing.SABRA  PLAN:   The prostate, seminal vesicles, and pelvic lymph nodes will initially be treated to 45 Gy in 25 fractions of 1.8 Gy followed by a boost to the prostate only, to 75 Gy with 15 additional fractions of 2.0 Gy   ________________________________  Donnice FELIX Patrcia, M.D.

## 2024-07-23 ENCOUNTER — Ambulatory Visit (HOSPITAL_COMMUNITY)
Admission: RE | Admit: 2024-07-23 | Discharge: 2024-07-23 | Disposition: A | Discharge status: Home / Self Care | Source: Ambulatory Visit | Attending: Urology | Admitting: Urology

## 2024-07-23 ENCOUNTER — Ambulatory Visit: Admitting: Radiation Oncology

## 2024-07-23 DIAGNOSIS — C61 Malignant neoplasm of prostate: Secondary | ICD-10-CM

## 2024-07-24 ENCOUNTER — Ambulatory Visit
Admission: RE | Admit: 2024-07-24 | Discharge: 2024-07-24 | Disposition: A | Discharge status: Home / Self Care | Source: Ambulatory Visit | Attending: Radiation Oncology | Admitting: Radiation Oncology

## 2024-07-24 DIAGNOSIS — C61 Malignant neoplasm of prostate: Secondary | ICD-10-CM | POA: Insufficient documentation

## 2024-07-24 DIAGNOSIS — Z510 Encounter for antineoplastic radiation therapy: Secondary | ICD-10-CM | POA: Insufficient documentation

## 2024-07-24 NOTE — Progress Notes (Signed)
  Radiation Oncology         [phone]) [phone] ________________________________  Name: Brian [name] MRN: [id number]  Date: 07/24/2024  DOB: [birth year]  SIMULATION AND TREATMENT PLANNING NOTE    ICD-10-CM   1. Prostate cancer (HCC); PSA 4.0; cT1cNxMx; GG 1; low risk disease  C61       DIAGNOSIS:  73 y.o. gentleman with Stage T1c adenocarcinoma of the prostate with Gleason score of 4+4, and PSA of 4.1. There is a dominant intraprostatic nodule on PSMA PET imaging that correlates with his MRI and pathology.  NARRATIVE:  The patient was brought to the CT Simulation planning suite.  Identity was confirmed.  All relevant records and images related to the planned course of therapy were reviewed.  The patient freely provided informed written consent to proceed with treatment after reviewing the details related to the planned course of therapy. The consent form was witnessed and verified by the simulation staff.  Then, the patient was set-up in a stable reproducible supine position for radiation therapy.  A vacuum lock pillow device was custom fabricated to position his legs in a reproducible immobilized position.  Then, I performed a urethrogram under sterile conditions to identify the prostatic apex.  CT images were obtained.  Surface markings were placed.  The CT images were loaded into the planning software.  Then the prostate target and avoidance structures including the rectum, bladder, bowel and hips were contoured.  Treatment planning then occurred.  The radiation prescription was entered and confirmed.  A total of one complex treatment devices was fabricated. I have requested : Intensity Modulated Radiotherapy (IMRT) is medically necessary for this case for the following reason:  Rectal sparing.SABRA  PLAN:   The prostate, seminal vesicles, and pelvic lymph nodes will initially be treated to 45 Gy in 25 fractions of 1.8 Gy followed by a boost to the prostate to 75 Gy with 15 additional fractions of 2.0 Gy  while the nodule goes to 88 Gy  ________________________________  Donnice LABOR. Patrcia, M.D.

## 2024-08-03 DIAGNOSIS — Z510 Encounter for antineoplastic radiation therapy: Secondary | ICD-10-CM | POA: Diagnosis not present

## 2024-08-04 ENCOUNTER — Other Ambulatory Visit: Payer: Self-pay

## 2024-08-04 ENCOUNTER — Ambulatory Visit
Admission: RE | Admit: 2024-08-04 | Discharge: 2024-08-04 | Disposition: A | Source: Ambulatory Visit | Attending: Radiation Oncology

## 2024-08-04 DIAGNOSIS — Z510 Encounter for antineoplastic radiation therapy: Secondary | ICD-10-CM | POA: Diagnosis not present

## 2024-08-04 LAB — RAD ONC ARIA SESSION SUMMARY
Course Elapsed Days: 0
Plan Fractions Treated to Date: 1
Plan Prescribed Dose Per Fraction: 1.8 Gy
Plan Total Fractions Prescribed: 25
Plan Total Prescribed Dose: 45 Gy
Reference Point Dosage Given to Date: 1.8 Gy
Reference Point Session Dosage Given: 1.8 Gy
Session Number: 1

## 2024-08-05 ENCOUNTER — Other Ambulatory Visit: Payer: Self-pay

## 2024-08-05 ENCOUNTER — Ambulatory Visit
Admission: RE | Admit: 2024-08-05 | Discharge: 2024-08-05 | Disposition: A | Discharge status: Home / Self Care | Source: Ambulatory Visit | Attending: Radiation Oncology | Admitting: Radiation Oncology

## 2024-08-05 DIAGNOSIS — Z510 Encounter for antineoplastic radiation therapy: Secondary | ICD-10-CM | POA: Diagnosis not present

## 2024-08-05 LAB — RAD ONC ARIA SESSION SUMMARY
Course Elapsed Days: 1
Plan Fractions Treated to Date: 2
Plan Prescribed Dose Per Fraction: 1.8 Gy
Plan Total Fractions Prescribed: 25
Plan Total Prescribed Dose: 45 Gy
Reference Point Dosage Given to Date: 3.6 Gy
Reference Point Session Dosage Given: 1.8 Gy
Session Number: 2

## 2024-08-06 ENCOUNTER — Ambulatory Visit
Admission: RE | Admit: 2024-08-06 | Discharge: 2024-08-06 | Disposition: A | Discharge status: Home / Self Care | Source: Ambulatory Visit | Attending: Radiation Oncology | Admitting: Radiation Oncology

## 2024-08-06 ENCOUNTER — Other Ambulatory Visit: Payer: Self-pay

## 2024-08-06 DIAGNOSIS — Z510 Encounter for antineoplastic radiation therapy: Secondary | ICD-10-CM | POA: Diagnosis not present

## 2024-08-06 LAB — RAD ONC ARIA SESSION SUMMARY
Course Elapsed Days: 2
Plan Fractions Treated to Date: 3
Plan Prescribed Dose Per Fraction: 1.8 Gy
Plan Total Fractions Prescribed: 25
Plan Total Prescribed Dose: 45 Gy
Reference Point Dosage Given to Date: 5.4 Gy
Reference Point Session Dosage Given: 1.8 Gy
Session Number: 3

## 2024-08-07 ENCOUNTER — Ambulatory Visit
Admission: RE | Admit: 2024-08-07 | Discharge: 2024-08-07 | Disposition: A | Discharge status: Home / Self Care | Source: Ambulatory Visit | Attending: Radiation Oncology | Admitting: Radiation Oncology

## 2024-08-07 ENCOUNTER — Other Ambulatory Visit: Payer: Self-pay

## 2024-08-07 DIAGNOSIS — Z510 Encounter for antineoplastic radiation therapy: Secondary | ICD-10-CM | POA: Diagnosis not present

## 2024-08-07 LAB — RAD ONC ARIA SESSION SUMMARY
Course Elapsed Days: 3
Plan Fractions Treated to Date: 4
Plan Prescribed Dose Per Fraction: 1.8 Gy
Plan Total Fractions Prescribed: 25
Plan Total Prescribed Dose: 45 Gy
Reference Point Dosage Given to Date: 7.2 Gy
Reference Point Session Dosage Given: 1.8 Gy
Session Number: 4

## 2024-08-09 ENCOUNTER — Other Ambulatory Visit: Payer: Self-pay

## 2024-08-09 ENCOUNTER — Ambulatory Visit
Admission: RE | Admit: 2024-08-09 | Discharge: 2024-08-09 | Disposition: A | Discharge status: Home / Self Care | Source: Ambulatory Visit | Attending: Radiation Oncology | Admitting: Radiation Oncology

## 2024-08-09 DIAGNOSIS — Z510 Encounter for antineoplastic radiation therapy: Secondary | ICD-10-CM | POA: Diagnosis not present

## 2024-08-09 LAB — RAD ONC ARIA SESSION SUMMARY
Course Elapsed Days: 5
Plan Fractions Treated to Date: 5
Plan Prescribed Dose Per Fraction: 1.8 Gy
Plan Total Fractions Prescribed: 25
Plan Total Prescribed Dose: 45 Gy
Reference Point Dosage Given to Date: 9 Gy
Reference Point Session Dosage Given: 1.8 Gy
Session Number: 5

## 2024-08-10 ENCOUNTER — Other Ambulatory Visit: Payer: Self-pay

## 2024-08-10 ENCOUNTER — Ambulatory Visit
Admission: RE | Admit: 2024-08-10 | Discharge: 2024-08-10 | Disposition: A | Discharge status: Home / Self Care | Source: Ambulatory Visit | Attending: Radiation Oncology | Admitting: Radiation Oncology

## 2024-08-10 DIAGNOSIS — Z510 Encounter for antineoplastic radiation therapy: Secondary | ICD-10-CM | POA: Diagnosis not present

## 2024-08-10 LAB — RAD ONC ARIA SESSION SUMMARY
Course Elapsed Days: 6
Plan Fractions Treated to Date: 6
Plan Prescribed Dose Per Fraction: 1.8 Gy
Plan Total Fractions Prescribed: 25
Plan Total Prescribed Dose: 45 Gy
Reference Point Dosage Given to Date: 10.8 Gy
Reference Point Session Dosage Given: 1.8 Gy
Session Number: 6

## 2024-08-11 ENCOUNTER — Ambulatory Visit
Admission: RE | Admit: 2024-08-11 | Discharge: 2024-08-11 | Disposition: A | Discharge status: Home / Self Care | Source: Ambulatory Visit | Attending: Radiation Oncology | Admitting: Radiation Oncology

## 2024-08-11 ENCOUNTER — Other Ambulatory Visit: Payer: Self-pay

## 2024-08-11 DIAGNOSIS — Z510 Encounter for antineoplastic radiation therapy: Secondary | ICD-10-CM | POA: Diagnosis not present

## 2024-08-11 LAB — RAD ONC ARIA SESSION SUMMARY
Course Elapsed Days: 7
Plan Fractions Treated to Date: 7
Plan Prescribed Dose Per Fraction: 1.8 Gy
Plan Total Fractions Prescribed: 25
Plan Total Prescribed Dose: 45 Gy
Reference Point Dosage Given to Date: 12.6 Gy
Reference Point Session Dosage Given: 1.8 Gy
Session Number: 7

## 2024-08-12 ENCOUNTER — Ambulatory Visit
Admission: RE | Admit: 2024-08-12 | Discharge: 2024-08-12 | Disposition: A | Source: Ambulatory Visit | Attending: Radiation Oncology

## 2024-08-12 ENCOUNTER — Ambulatory Visit
Admission: RE | Admit: 2024-08-12 | Discharge: 2024-08-12 | Disposition: A | Discharge status: Home / Self Care | Source: Ambulatory Visit | Attending: Radiation Oncology | Admitting: Radiation Oncology

## 2024-08-12 ENCOUNTER — Other Ambulatory Visit: Payer: Self-pay

## 2024-08-12 DIAGNOSIS — Z510 Encounter for antineoplastic radiation therapy: Secondary | ICD-10-CM | POA: Diagnosis not present

## 2024-08-12 LAB — RAD ONC ARIA SESSION SUMMARY
Course Elapsed Days: 8
Plan Fractions Treated to Date: 8
Plan Prescribed Dose Per Fraction: 1.8 Gy
Plan Total Fractions Prescribed: 25
Plan Total Prescribed Dose: 45 Gy
Reference Point Dosage Given to Date: 14.4 Gy
Reference Point Session Dosage Given: 1.8 Gy
Session Number: 8

## 2024-08-17 ENCOUNTER — Other Ambulatory Visit: Payer: Self-pay

## 2024-08-17 ENCOUNTER — Ambulatory Visit
Admission: RE | Admit: 2024-08-17 | Discharge: 2024-08-17 | Disposition: A | Source: Ambulatory Visit | Attending: Radiation Oncology

## 2024-08-17 DIAGNOSIS — C61 Malignant neoplasm of prostate: Secondary | ICD-10-CM | POA: Diagnosis present

## 2024-08-17 DIAGNOSIS — Z510 Encounter for antineoplastic radiation therapy: Secondary | ICD-10-CM | POA: Insufficient documentation

## 2024-08-17 LAB — RAD ONC ARIA SESSION SUMMARY
Course Elapsed Days: 13
Plan Fractions Treated to Date: 9
Plan Prescribed Dose Per Fraction: 1.8 Gy
Plan Total Fractions Prescribed: 25
Plan Total Prescribed Dose: 45 Gy
Reference Point Dosage Given to Date: 16.2 Gy
Reference Point Session Dosage Given: 1.8 Gy
Session Number: 9

## 2024-08-18 ENCOUNTER — Ambulatory Visit
Admission: RE | Admit: 2024-08-18 | Discharge: 2024-08-18 | Disposition: A | Source: Ambulatory Visit | Attending: Radiation Oncology

## 2024-08-18 ENCOUNTER — Other Ambulatory Visit: Payer: Self-pay

## 2024-08-18 DIAGNOSIS — Z510 Encounter for antineoplastic radiation therapy: Secondary | ICD-10-CM | POA: Diagnosis not present

## 2024-08-18 LAB — RAD ONC ARIA SESSION SUMMARY
Course Elapsed Days: 14
Plan Fractions Treated to Date: 10
Plan Prescribed Dose Per Fraction: 1.8 Gy
Plan Total Fractions Prescribed: 25
Plan Total Prescribed Dose: 45 Gy
Reference Point Dosage Given to Date: 18 Gy
Reference Point Session Dosage Given: 1.8 Gy
Session Number: 10

## 2024-08-19 ENCOUNTER — Other Ambulatory Visit: Payer: Self-pay

## 2024-08-19 ENCOUNTER — Ambulatory Visit
Admission: RE | Admit: 2024-08-19 | Discharge: 2024-08-19 | Disposition: A | Source: Ambulatory Visit | Attending: Radiation Oncology

## 2024-08-19 DIAGNOSIS — Z510 Encounter for antineoplastic radiation therapy: Secondary | ICD-10-CM | POA: Diagnosis not present

## 2024-08-19 LAB — RAD ONC ARIA SESSION SUMMARY
Course Elapsed Days: 15
Plan Fractions Treated to Date: 11
Plan Prescribed Dose Per Fraction: 1.8 Gy
Plan Total Fractions Prescribed: 25
Plan Total Prescribed Dose: 45 Gy
Reference Point Dosage Given to Date: 19.8 Gy
Reference Point Session Dosage Given: 1.8 Gy
Session Number: 11

## 2024-08-20 ENCOUNTER — Other Ambulatory Visit: Payer: Self-pay

## 2024-08-20 ENCOUNTER — Ambulatory Visit
Admission: RE | Admit: 2024-08-20 | Discharge: 2024-08-20 | Disposition: A | Source: Ambulatory Visit | Attending: Radiation Oncology

## 2024-08-20 DIAGNOSIS — Z510 Encounter for antineoplastic radiation therapy: Secondary | ICD-10-CM | POA: Diagnosis not present

## 2024-08-20 LAB — RAD ONC ARIA SESSION SUMMARY
Course Elapsed Days: 16
Plan Fractions Treated to Date: 12
Plan Prescribed Dose Per Fraction: 1.8 Gy
Plan Total Fractions Prescribed: 25
Plan Total Prescribed Dose: 45 Gy
Reference Point Dosage Given to Date: 21.6 Gy
Reference Point Session Dosage Given: 1.8 Gy
Session Number: 12

## 2024-08-21 ENCOUNTER — Ambulatory Visit
Admission: RE | Admit: 2024-08-21 | Discharge: 2024-08-21 | Disposition: A | Source: Ambulatory Visit | Attending: Radiation Oncology

## 2024-08-21 ENCOUNTER — Other Ambulatory Visit: Payer: Self-pay

## 2024-08-21 DIAGNOSIS — Z510 Encounter for antineoplastic radiation therapy: Secondary | ICD-10-CM | POA: Diagnosis not present

## 2024-08-21 LAB — RAD ONC ARIA SESSION SUMMARY
Course Elapsed Days: 17
Plan Fractions Treated to Date: 13
Plan Prescribed Dose Per Fraction: 1.8 Gy
Plan Total Fractions Prescribed: 25
Plan Total Prescribed Dose: 45 Gy
Reference Point Dosage Given to Date: 23.4 Gy
Reference Point Session Dosage Given: 1.8 Gy
Session Number: 13

## 2024-08-24 ENCOUNTER — Other Ambulatory Visit: Payer: Self-pay

## 2024-08-24 ENCOUNTER — Ambulatory Visit
Admission: RE | Admit: 2024-08-24 | Discharge: 2024-08-24 | Disposition: A | Source: Ambulatory Visit | Attending: Radiation Oncology

## 2024-08-24 DIAGNOSIS — Z510 Encounter for antineoplastic radiation therapy: Secondary | ICD-10-CM | POA: Diagnosis not present

## 2024-08-24 LAB — RAD ONC ARIA SESSION SUMMARY
Course Elapsed Days: 20
Plan Fractions Treated to Date: 14
Plan Prescribed Dose Per Fraction: 1.8 Gy
Plan Total Fractions Prescribed: 25
Plan Total Prescribed Dose: 45 Gy
Reference Point Dosage Given to Date: 25.2 Gy
Reference Point Session Dosage Given: 1.8 Gy
Session Number: 14

## 2024-08-25 ENCOUNTER — Ambulatory Visit
Admission: RE | Admit: 2024-08-25 | Discharge: 2024-08-25 | Disposition: A | Source: Ambulatory Visit | Attending: Radiation Oncology

## 2024-08-25 ENCOUNTER — Other Ambulatory Visit: Payer: Self-pay

## 2024-08-25 DIAGNOSIS — Z510 Encounter for antineoplastic radiation therapy: Secondary | ICD-10-CM | POA: Diagnosis not present

## 2024-08-25 LAB — RAD ONC ARIA SESSION SUMMARY
Course Elapsed Days: 21
Plan Fractions Treated to Date: 15
Plan Prescribed Dose Per Fraction: 1.8 Gy
Plan Total Fractions Prescribed: 25
Plan Total Prescribed Dose: 45 Gy
Reference Point Dosage Given to Date: 27 Gy
Reference Point Session Dosage Given: 1.8 Gy
Session Number: 15

## 2024-08-26 ENCOUNTER — Other Ambulatory Visit: Payer: Self-pay

## 2024-08-26 ENCOUNTER — Ambulatory Visit
Admission: RE | Admit: 2024-08-26 | Discharge: 2024-08-26 | Disposition: A | Discharge status: Home / Self Care | Source: Ambulatory Visit | Attending: Radiation Oncology | Admitting: Radiation Oncology

## 2024-08-26 DIAGNOSIS — Z510 Encounter for antineoplastic radiation therapy: Secondary | ICD-10-CM | POA: Diagnosis not present

## 2024-08-26 LAB — RAD ONC ARIA SESSION SUMMARY
Course Elapsed Days: 22
Plan Fractions Treated to Date: 16
Plan Prescribed Dose Per Fraction: 1.8 Gy
Plan Total Fractions Prescribed: 25
Plan Total Prescribed Dose: 45 Gy
Reference Point Dosage Given to Date: 28.8 Gy
Reference Point Session Dosage Given: 1.8 Gy
Session Number: 16

## 2024-08-27 ENCOUNTER — Other Ambulatory Visit: Payer: Self-pay

## 2024-08-27 ENCOUNTER — Ambulatory Visit
Admission: RE | Admit: 2024-08-27 | Discharge: 2024-08-27 | Disposition: A | Discharge status: Home / Self Care | Source: Ambulatory Visit | Attending: Radiation Oncology | Admitting: Radiation Oncology

## 2024-08-27 DIAGNOSIS — Z510 Encounter for antineoplastic radiation therapy: Secondary | ICD-10-CM | POA: Diagnosis not present

## 2024-08-27 LAB — RAD ONC ARIA SESSION SUMMARY
Course Elapsed Days: 23
Plan Fractions Treated to Date: 17
Plan Prescribed Dose Per Fraction: 1.8 Gy
Plan Total Fractions Prescribed: 25
Plan Total Prescribed Dose: 45 Gy
Reference Point Dosage Given to Date: 30.6 Gy
Reference Point Session Dosage Given: 1.8 Gy
Session Number: 17

## 2024-08-28 ENCOUNTER — Ambulatory Visit: Admission: RE | Admit: 2024-08-28 | Discharge: 2024-08-28 | Attending: Radiation Oncology

## 2024-08-28 ENCOUNTER — Other Ambulatory Visit: Payer: Self-pay

## 2024-08-28 DIAGNOSIS — Z510 Encounter for antineoplastic radiation therapy: Secondary | ICD-10-CM | POA: Diagnosis not present

## 2024-08-28 LAB — RAD ONC ARIA SESSION SUMMARY
Course Elapsed Days: 24
Plan Fractions Treated to Date: 18
Plan Prescribed Dose Per Fraction: 1.8 Gy
Plan Total Fractions Prescribed: 25
Plan Total Prescribed Dose: 45 Gy
Reference Point Dosage Given to Date: 32.4 Gy
Reference Point Session Dosage Given: 1.8 Gy
Session Number: 18

## 2024-08-31 ENCOUNTER — Other Ambulatory Visit: Payer: Self-pay

## 2024-08-31 ENCOUNTER — Ambulatory Visit: Admission: RE | Admit: 2024-08-31 | Discharge: 2024-08-31 | Attending: Radiation Oncology

## 2024-08-31 ENCOUNTER — Ambulatory Visit

## 2024-08-31 DIAGNOSIS — Z510 Encounter for antineoplastic radiation therapy: Secondary | ICD-10-CM | POA: Diagnosis not present

## 2024-08-31 LAB — RAD ONC ARIA SESSION SUMMARY
Course Elapsed Days: 27
Plan Fractions Treated to Date: 19
Plan Prescribed Dose Per Fraction: 1.8 Gy
Plan Total Fractions Prescribed: 25
Plan Total Prescribed Dose: 45 Gy
Reference Point Dosage Given to Date: 34.2 Gy
Reference Point Session Dosage Given: 1.8 Gy
Session Number: 19

## 2024-09-01 ENCOUNTER — Ambulatory Visit

## 2024-09-02 ENCOUNTER — Ambulatory Visit: Admission: RE | Admit: 2024-09-02 | Discharge: 2024-09-02 | Attending: Radiation Oncology

## 2024-09-02 ENCOUNTER — Other Ambulatory Visit: Payer: Self-pay

## 2024-09-02 DIAGNOSIS — Z510 Encounter for antineoplastic radiation therapy: Secondary | ICD-10-CM | POA: Diagnosis not present

## 2024-09-02 LAB — RAD ONC ARIA SESSION SUMMARY
Course Elapsed Days: 29
Plan Fractions Treated to Date: 20
Plan Prescribed Dose Per Fraction: 1.8 Gy
Plan Total Fractions Prescribed: 25
Plan Total Prescribed Dose: 45 Gy
Reference Point Dosage Given to Date: 36 Gy
Reference Point Session Dosage Given: 1.8 Gy
Session Number: 20

## 2024-09-03 ENCOUNTER — Ambulatory Visit: Admission: RE | Admit: 2024-09-03

## 2024-09-03 ENCOUNTER — Other Ambulatory Visit: Payer: Self-pay

## 2024-09-03 DIAGNOSIS — Z510 Encounter for antineoplastic radiation therapy: Secondary | ICD-10-CM | POA: Diagnosis not present

## 2024-09-03 LAB — RAD ONC ARIA SESSION SUMMARY
Course Elapsed Days: 30
Plan Fractions Treated to Date: 21
Plan Prescribed Dose Per Fraction: 1.8 Gy
Plan Total Fractions Prescribed: 25
Plan Total Prescribed Dose: 45 Gy
Reference Point Dosage Given to Date: 37.8 Gy
Reference Point Session Dosage Given: 1.8 Gy
Session Number: 21

## 2024-09-04 ENCOUNTER — Other Ambulatory Visit: Payer: Self-pay

## 2024-09-04 ENCOUNTER — Ambulatory Visit: Admission: RE | Admit: 2024-09-04 | Discharge: 2024-09-04 | Attending: Radiation Oncology

## 2024-09-04 DIAGNOSIS — Z510 Encounter for antineoplastic radiation therapy: Secondary | ICD-10-CM | POA: Diagnosis not present

## 2024-09-04 LAB — RAD ONC ARIA SESSION SUMMARY
Course Elapsed Days: 31
Plan Fractions Treated to Date: 22
Plan Prescribed Dose Per Fraction: 1.8 Gy
Plan Total Fractions Prescribed: 25
Plan Total Prescribed Dose: 45 Gy
Reference Point Dosage Given to Date: 39.6 Gy
Reference Point Session Dosage Given: 1.8 Gy
Session Number: 22

## 2024-09-07 ENCOUNTER — Other Ambulatory Visit: Payer: Self-pay

## 2024-09-07 ENCOUNTER — Ambulatory Visit: Admission: RE | Admit: 2024-09-07 | Discharge: 2024-09-07 | Attending: Radiation Oncology

## 2024-09-07 DIAGNOSIS — Z510 Encounter for antineoplastic radiation therapy: Secondary | ICD-10-CM | POA: Diagnosis not present

## 2024-09-07 LAB — RAD ONC ARIA SESSION SUMMARY
Course Elapsed Days: 34
Plan Fractions Treated to Date: 23
Plan Prescribed Dose Per Fraction: 1.8 Gy
Plan Total Fractions Prescribed: 25
Plan Total Prescribed Dose: 45 Gy
Reference Point Dosage Given to Date: 41.4 Gy
Reference Point Session Dosage Given: 1.8 Gy
Session Number: 23

## 2024-09-08 ENCOUNTER — Other Ambulatory Visit: Payer: Self-pay

## 2024-09-08 ENCOUNTER — Ambulatory Visit
Admission: RE | Admit: 2024-09-08 | Discharge: 2024-09-08 | Disposition: A | Discharge status: Home / Self Care | Source: Ambulatory Visit | Attending: Radiation Oncology | Admitting: Radiation Oncology

## 2024-09-08 DIAGNOSIS — Z510 Encounter for antineoplastic radiation therapy: Secondary | ICD-10-CM | POA: Diagnosis not present

## 2024-09-08 LAB — RAD ONC ARIA SESSION SUMMARY
Course Elapsed Days: 35
Plan Fractions Treated to Date: 24
Plan Prescribed Dose Per Fraction: 1.8 Gy
Plan Total Fractions Prescribed: 25
Plan Total Prescribed Dose: 45 Gy
Reference Point Dosage Given to Date: 43.2 Gy
Reference Point Session Dosage Given: 1.8 Gy
Session Number: 24

## 2024-09-09 ENCOUNTER — Ambulatory Visit

## 2024-09-09 ENCOUNTER — Other Ambulatory Visit: Payer: Self-pay

## 2024-09-09 ENCOUNTER — Ambulatory Visit
Admission: RE | Admit: 2024-09-09 | Discharge: 2024-09-09 | Disposition: A | Source: Ambulatory Visit | Attending: Radiation Oncology

## 2024-09-09 DIAGNOSIS — Z510 Encounter for antineoplastic radiation therapy: Secondary | ICD-10-CM | POA: Diagnosis not present

## 2024-09-09 LAB — RAD ONC ARIA SESSION SUMMARY
Course Elapsed Days: 36
Plan Fractions Treated to Date: 25
Plan Prescribed Dose Per Fraction: 1.8 Gy
Plan Total Fractions Prescribed: 25
Plan Total Prescribed Dose: 45 Gy
Reference Point Dosage Given to Date: 45 Gy
Reference Point Session Dosage Given: 1.8 Gy
Session Number: 25

## 2024-09-14 ENCOUNTER — Other Ambulatory Visit: Payer: Self-pay

## 2024-09-14 ENCOUNTER — Ambulatory Visit
Admission: RE | Admit: 2024-09-14 | Discharge: 2024-09-14 | Disposition: A | Discharge status: Home / Self Care | Source: Ambulatory Visit | Attending: Radiation Oncology | Admitting: Radiation Oncology

## 2024-09-14 DIAGNOSIS — Z510 Encounter for antineoplastic radiation therapy: Secondary | ICD-10-CM | POA: Diagnosis not present

## 2024-09-14 LAB — RAD ONC ARIA SESSION SUMMARY
Course Elapsed Days: 41
Plan Fractions Treated to Date: 1
Plan Prescribed Dose Per Fraction: 2 Gy
Plan Total Fractions Prescribed: 15
Plan Total Prescribed Dose: 30 Gy
Reference Point Dosage Given to Date: 2 Gy
Reference Point Session Dosage Given: 2 Gy
Session Number: 26

## 2024-09-15 ENCOUNTER — Other Ambulatory Visit: Payer: Self-pay

## 2024-09-15 ENCOUNTER — Ambulatory Visit
Admission: RE | Admit: 2024-09-15 | Discharge: 2024-09-15 | Disposition: A | Discharge status: Home / Self Care | Source: Ambulatory Visit | Attending: Radiation Oncology | Admitting: Radiation Oncology

## 2024-09-15 DIAGNOSIS — Z510 Encounter for antineoplastic radiation therapy: Secondary | ICD-10-CM | POA: Diagnosis not present

## 2024-09-15 LAB — RAD ONC ARIA SESSION SUMMARY
Course Elapsed Days: 42
Plan Fractions Treated to Date: 2
Plan Prescribed Dose Per Fraction: 2 Gy
Plan Total Fractions Prescribed: 15
Plan Total Prescribed Dose: 30 Gy
Reference Point Dosage Given to Date: 4 Gy
Reference Point Session Dosage Given: 2 Gy
Session Number: 27

## 2024-09-16 ENCOUNTER — Other Ambulatory Visit: Payer: Self-pay

## 2024-09-16 ENCOUNTER — Ambulatory Visit
Admission: RE | Admit: 2024-09-16 | Discharge: 2024-09-16 | Disposition: A | Discharge status: Home / Self Care | Source: Ambulatory Visit | Attending: Radiation Oncology | Admitting: Radiation Oncology

## 2024-09-16 DIAGNOSIS — Z510 Encounter for antineoplastic radiation therapy: Secondary | ICD-10-CM | POA: Diagnosis not present

## 2024-09-16 LAB — RAD ONC ARIA SESSION SUMMARY
Course Elapsed Days: 43
Plan Fractions Treated to Date: 3
Plan Prescribed Dose Per Fraction: 2 Gy
Plan Total Fractions Prescribed: 15
Plan Total Prescribed Dose: 30 Gy
Reference Point Dosage Given to Date: 6 Gy
Reference Point Session Dosage Given: 2 Gy
Session Number: 28

## 2024-09-18 ENCOUNTER — Ambulatory Visit
Admission: RE | Admit: 2024-09-18 | Discharge: 2024-09-18 | Disposition: A | Source: Ambulatory Visit | Attending: Radiation Oncology

## 2024-09-18 ENCOUNTER — Other Ambulatory Visit: Payer: Self-pay

## 2024-09-18 ENCOUNTER — Ambulatory Visit
Admission: RE | Admit: 2024-09-18 | Discharge: 2024-09-18 | Disposition: A | Discharge status: Home / Self Care | Source: Ambulatory Visit | Attending: Radiation Oncology | Admitting: Radiation Oncology

## 2024-09-18 DIAGNOSIS — Z510 Encounter for antineoplastic radiation therapy: Secondary | ICD-10-CM | POA: Diagnosis present

## 2024-09-18 DIAGNOSIS — C61 Malignant neoplasm of prostate: Secondary | ICD-10-CM | POA: Insufficient documentation

## 2024-09-18 LAB — RAD ONC ARIA SESSION SUMMARY
Course Elapsed Days: 45
Plan Fractions Treated to Date: 4
Plan Prescribed Dose Per Fraction: 2 Gy
Plan Total Fractions Prescribed: 15
Plan Total Prescribed Dose: 30 Gy
Reference Point Dosage Given to Date: 8 Gy
Reference Point Session Dosage Given: 2 Gy
Session Number: 29

## 2024-09-21 ENCOUNTER — Other Ambulatory Visit: Payer: Self-pay

## 2024-09-21 ENCOUNTER — Ambulatory Visit
Admission: RE | Admit: 2024-09-21 | Discharge: 2024-09-21 | Disposition: A | Discharge status: Home / Self Care | Source: Ambulatory Visit | Attending: Radiation Oncology | Admitting: Radiation Oncology

## 2024-09-21 DIAGNOSIS — Z510 Encounter for antineoplastic radiation therapy: Secondary | ICD-10-CM | POA: Diagnosis not present

## 2024-09-21 LAB — RAD ONC ARIA SESSION SUMMARY
Course Elapsed Days: 48
Plan Fractions Treated to Date: 5
Plan Prescribed Dose Per Fraction: 2 Gy
Plan Total Fractions Prescribed: 15
Plan Total Prescribed Dose: 30 Gy
Reference Point Dosage Given to Date: 10 Gy
Reference Point Session Dosage Given: 2 Gy
Session Number: 30

## 2024-09-22 ENCOUNTER — Ambulatory Visit
Admission: RE | Admit: 2024-09-22 | Discharge: 2024-09-22 | Disposition: A | Source: Ambulatory Visit | Attending: Radiation Oncology

## 2024-09-22 ENCOUNTER — Other Ambulatory Visit: Payer: Self-pay

## 2024-09-22 DIAGNOSIS — Z510 Encounter for antineoplastic radiation therapy: Secondary | ICD-10-CM | POA: Diagnosis not present

## 2024-09-22 LAB — RAD ONC ARIA SESSION SUMMARY
Course Elapsed Days: 49
Plan Fractions Treated to Date: 6
Plan Prescribed Dose Per Fraction: 2 Gy
Plan Total Fractions Prescribed: 15
Plan Total Prescribed Dose: 30 Gy
Reference Point Dosage Given to Date: 12 Gy
Reference Point Session Dosage Given: 2 Gy
Session Number: 31

## 2024-09-23 ENCOUNTER — Other Ambulatory Visit: Payer: Self-pay

## 2024-09-23 ENCOUNTER — Ambulatory Visit
Admission: RE | Admit: 2024-09-23 | Discharge: 2024-09-23 | Disposition: A | Discharge status: Home / Self Care | Source: Ambulatory Visit | Attending: Radiation Oncology | Admitting: Radiation Oncology

## 2024-09-23 DIAGNOSIS — Z510 Encounter for antineoplastic radiation therapy: Secondary | ICD-10-CM | POA: Diagnosis not present

## 2024-09-23 LAB — RAD ONC ARIA SESSION SUMMARY
Course Elapsed Days: 50
Plan Fractions Treated to Date: 7
Plan Prescribed Dose Per Fraction: 2 Gy
Plan Total Fractions Prescribed: 15
Plan Total Prescribed Dose: 30 Gy
Reference Point Dosage Given to Date: 14 Gy
Reference Point Session Dosage Given: 2 Gy
Session Number: 32

## 2024-09-24 ENCOUNTER — Other Ambulatory Visit: Payer: Self-pay

## 2024-09-24 ENCOUNTER — Ambulatory Visit
Admission: RE | Admit: 2024-09-24 | Discharge: 2024-09-24 | Disposition: A | Discharge status: Home / Self Care | Source: Ambulatory Visit | Attending: Radiation Oncology | Admitting: Radiation Oncology

## 2024-09-24 DIAGNOSIS — Z510 Encounter for antineoplastic radiation therapy: Secondary | ICD-10-CM | POA: Diagnosis not present

## 2024-09-24 LAB — RAD ONC ARIA SESSION SUMMARY
Course Elapsed Days: 51
Plan Fractions Treated to Date: 8
Plan Prescribed Dose Per Fraction: 2 Gy
Plan Total Fractions Prescribed: 15
Plan Total Prescribed Dose: 30 Gy
Reference Point Dosage Given to Date: 16 Gy
Reference Point Session Dosage Given: 2 Gy
Session Number: 33

## 2024-09-25 ENCOUNTER — Ambulatory Visit
Admission: RE | Admit: 2024-09-25 | Discharge: 2024-09-25 | Disposition: A | Source: Ambulatory Visit | Attending: Radiation Oncology

## 2024-09-25 ENCOUNTER — Other Ambulatory Visit: Payer: Self-pay

## 2024-09-25 DIAGNOSIS — Z510 Encounter for antineoplastic radiation therapy: Secondary | ICD-10-CM | POA: Diagnosis not present

## 2024-09-25 LAB — RAD ONC ARIA SESSION SUMMARY
Course Elapsed Days: 52
Plan Fractions Treated to Date: 9
Plan Prescribed Dose Per Fraction: 2 Gy
Plan Total Fractions Prescribed: 15
Plan Total Prescribed Dose: 30 Gy
Reference Point Dosage Given to Date: 18 Gy
Reference Point Session Dosage Given: 2 Gy
Session Number: 34

## 2024-09-28 ENCOUNTER — Ambulatory Visit
Admission: RE | Admit: 2024-09-28 | Discharge: 2024-09-28 | Disposition: A | Source: Ambulatory Visit | Attending: Radiation Oncology

## 2024-09-28 ENCOUNTER — Other Ambulatory Visit: Payer: Self-pay

## 2024-09-28 DIAGNOSIS — Z510 Encounter for antineoplastic radiation therapy: Secondary | ICD-10-CM | POA: Diagnosis not present

## 2024-09-28 LAB — RAD ONC ARIA SESSION SUMMARY
Course Elapsed Days: 55
Plan Fractions Treated to Date: 10
Plan Prescribed Dose Per Fraction: 2 Gy
Plan Total Fractions Prescribed: 15
Plan Total Prescribed Dose: 30 Gy
Reference Point Dosage Given to Date: 20 Gy
Reference Point Session Dosage Given: 2 Gy
Session Number: 35

## 2024-09-29 ENCOUNTER — Ambulatory Visit
Admission: RE | Admit: 2024-09-29 | Discharge: 2024-09-29 | Disposition: A | Source: Ambulatory Visit | Attending: Radiation Oncology

## 2024-09-29 ENCOUNTER — Other Ambulatory Visit: Payer: Self-pay

## 2024-09-29 DIAGNOSIS — Z510 Encounter for antineoplastic radiation therapy: Secondary | ICD-10-CM | POA: Diagnosis not present

## 2024-09-29 LAB — RAD ONC ARIA SESSION SUMMARY
Course Elapsed Days: 56
Plan Fractions Treated to Date: 11
Plan Prescribed Dose Per Fraction: 2 Gy
Plan Total Fractions Prescribed: 15
Plan Total Prescribed Dose: 30 Gy
Reference Point Dosage Given to Date: 22 Gy
Reference Point Session Dosage Given: 2 Gy
Session Number: 36

## 2024-09-30 ENCOUNTER — Other Ambulatory Visit: Payer: Self-pay

## 2024-09-30 ENCOUNTER — Ambulatory Visit
Admission: RE | Admit: 2024-09-30 | Discharge: 2024-09-30 | Disposition: A | Discharge status: Home / Self Care | Source: Ambulatory Visit | Attending: Radiation Oncology | Admitting: Radiation Oncology

## 2024-09-30 DIAGNOSIS — Z510 Encounter for antineoplastic radiation therapy: Secondary | ICD-10-CM | POA: Diagnosis not present

## 2024-09-30 LAB — RAD ONC ARIA SESSION SUMMARY
Course Elapsed Days: 57
Plan Fractions Treated to Date: 12
Plan Prescribed Dose Per Fraction: 2 Gy
Plan Total Fractions Prescribed: 15
Plan Total Prescribed Dose: 30 Gy
Reference Point Dosage Given to Date: 24 Gy
Reference Point Session Dosage Given: 2 Gy
Session Number: 37

## 2024-10-01 ENCOUNTER — Other Ambulatory Visit: Payer: Self-pay

## 2024-10-01 ENCOUNTER — Ambulatory Visit
Admission: RE | Admit: 2024-10-01 | Discharge: 2024-10-01 | Disposition: A | Source: Ambulatory Visit | Attending: Radiation Oncology

## 2024-10-01 DIAGNOSIS — Z510 Encounter for antineoplastic radiation therapy: Secondary | ICD-10-CM | POA: Diagnosis not present

## 2024-10-01 LAB — RAD ONC ARIA SESSION SUMMARY
Course Elapsed Days: 58
Plan Fractions Treated to Date: 13
Plan Prescribed Dose Per Fraction: 2 Gy
Plan Total Fractions Prescribed: 15
Plan Total Prescribed Dose: 30 Gy
Reference Point Dosage Given to Date: 26 Gy
Reference Point Session Dosage Given: 2 Gy
Session Number: 38

## 2024-10-02 ENCOUNTER — Ambulatory Visit

## 2024-10-02 ENCOUNTER — Other Ambulatory Visit: Payer: Self-pay

## 2024-10-02 ENCOUNTER — Ambulatory Visit
Admission: RE | Admit: 2024-10-02 | Discharge: 2024-10-02 | Disposition: A | Discharge status: Home / Self Care | Source: Ambulatory Visit | Attending: Radiation Oncology | Admitting: Radiation Oncology

## 2024-10-02 DIAGNOSIS — Z510 Encounter for antineoplastic radiation therapy: Secondary | ICD-10-CM | POA: Diagnosis not present

## 2024-10-02 LAB — RAD ONC ARIA SESSION SUMMARY
Course Elapsed Days: 59
Plan Fractions Treated to Date: 14
Plan Prescribed Dose Per Fraction: 2 Gy
Plan Total Fractions Prescribed: 15
Plan Total Prescribed Dose: 30 Gy
Reference Point Dosage Given to Date: 28 Gy
Reference Point Session Dosage Given: 2 Gy
Session Number: 39

## 2024-10-05 ENCOUNTER — Ambulatory Visit

## 2024-10-05 ENCOUNTER — Other Ambulatory Visit: Payer: Self-pay

## 2024-10-05 ENCOUNTER — Ambulatory Visit
Admission: RE | Admit: 2024-10-05 | Discharge: 2024-10-05 | Disposition: A | Source: Ambulatory Visit | Attending: Radiation Oncology

## 2024-10-05 DIAGNOSIS — Z510 Encounter for antineoplastic radiation therapy: Secondary | ICD-10-CM | POA: Diagnosis not present

## 2024-10-05 LAB — RAD ONC ARIA SESSION SUMMARY
Course Elapsed Days: 62
Plan Fractions Treated to Date: 15
Plan Prescribed Dose Per Fraction: 2 Gy
Plan Total Fractions Prescribed: 15
Plan Total Prescribed Dose: 30 Gy
Reference Point Dosage Given to Date: 30 Gy
Reference Point Session Dosage Given: 2 Gy
Session Number: 40

## 2024-10-07 NOTE — Radiation Completion Notes (Signed)
 Patient Name: Brian [name], Brian [name] MRN: [id number] Date of Birth: [birth year] Referring Physician: ADINE MANLY, M.D. Date of Service: 2024-10-07 Radiation Oncologist: Adina Barge, M.D.  [facility]                             RADIATION ONCOLOGY END OF TREATMENT NOTE     Diagnosis: C61 Malignant neoplasm of prostate Staging on 2024-03-30: Prostate cancer [facility]); PSA 4.0; cT1cNxMx; GG 1; low risk disease T=cT1c, N=cN0, M=cM0 Intent: Curative     ==========DELIVERED PLANS==========  First Treatment Date: 2024-08-04 Last Treatment Date: 2024-10-05   Plan Name: Prostate_Pelv Site: Prostate Technique: IMRT Mode: Photon Dose Per Fraction: 1.8 Gy Prescribed Dose (Delivered / Prescribed): 45 Gy / 45 Gy Prescribed Fxs (Delivered / Prescribed): 25 / 25   Plan Name: Prostate_Bst Site: Prostate Technique: IMRT Mode: Photon Dose Per Fraction: 2 Gy Prescribed Dose (Delivered / Prescribed): 30 Gy / 30 Gy Prescribed Fxs (Delivered / Prescribed): 15 / 15     ==========ON TREATMENT VISIT DATES========== 2024-08-07, 2024-08-12, 2024-08-21, 2024-09-02, 2024-09-08, 2024-09-18, 2024-09-25, 2024-10-02     ==========UPCOMING VISITS========== 10/14/2024 [facility]-CT IMAGING CT CHEST WO CONTRAST [facility]-CT 2        ==========APPENDIX - ON TREATMENT VISIT NOTES==========   See weekly On Treatment Notes in Epic for details in the Media tab (listed as Progress notes on the On Treatment Visit Dates listed above).

## 2024-10-09 ENCOUNTER — Other Ambulatory Visit: Payer: Self-pay | Admitting: Family Medicine

## 2024-10-09 DIAGNOSIS — I480 Paroxysmal atrial fibrillation: Secondary | ICD-10-CM

## 2024-10-09 DIAGNOSIS — G4700 Insomnia, unspecified: Secondary | ICD-10-CM

## 2024-10-14 ENCOUNTER — Ambulatory Visit (HOSPITAL_BASED_OUTPATIENT_CLINIC_OR_DEPARTMENT_OTHER)
Admission: RE | Admit: 2024-10-14 | Discharge: 2024-10-14 | Disposition: A | Discharge status: Home / Self Care | Source: Ambulatory Visit | Attending: Family Medicine | Admitting: Family Medicine

## 2024-10-14 DIAGNOSIS — R911 Solitary pulmonary nodule: Secondary | ICD-10-CM | POA: Diagnosis present

## 2024-10-18 ENCOUNTER — Other Ambulatory Visit: Payer: Self-pay | Admitting: Family Medicine

## 2024-10-18 ENCOUNTER — Encounter: Payer: Self-pay | Admitting: Family Medicine

## 2024-10-18 DIAGNOSIS — C61 Malignant neoplasm of prostate: Secondary | ICD-10-CM

## 2024-10-18 DIAGNOSIS — R911 Solitary pulmonary nodule: Secondary | ICD-10-CM

## 2024-10-22 NOTE — Progress Notes (Signed)
 Patient was a RadOnc Consult on 05/19/24 for his stage T1c adenocarcinoma of the prostate with Gleason score of 4+4, and PSA of 4.1.  Patient proceed with treatment recommendations of 8 weeks of external beam therapy concurrent with LT-ADT and had his final radiation treatment on 10/05/24.   Patient will be scheduled for a post treatment nurse call and patient will be reaching out to Dr. Lajoyce office for a follow up appointment.   RN spoke with patient and provided education on post treatment PSA monitoring.  Encouraged patient to reach out with any needs or questions.

## 2024-10-23 NOTE — Progress Notes (Incomplete)
" °  Radiation Oncology         [phone] ________________________________  Name: Brian [name] MRN: [id number]  Date of Service: 11/03/2024  DOB: 1951-06-01  Post Treatment Telephone Note  Diagnosis:  Malignant neoplasm of prostate   First Treatment Date: 2024-08-04 Last Treatment Date: 2024-10-05   Plan Name: Prostate_Pelv Site: Prostate Technique: IMRT Mode: Photon Dose Per Fraction: 1.8 Gy Prescribed Dose (Delivered / Prescribed): 45 Gy / 45 Gy Prescribed Fxs (Delivered / Prescribed): 25 / 25   Plan Name: Prostate_Bst Site: Prostate Technique: IMRT Mode: Photon Dose Per Fraction: 2 Gy Prescribed Dose (Delivered / Prescribed): 30 Gy / 30 Gy Prescribed Fxs (Delivered / Prescribed): 15 / 15  Pre Treatment IPSS Score: 5 (as documented in the provider consult note)  The patient {WAS/WAS NOT:[phone]::was not} available for call today.   Symptoms of fatigue {ACTIONS; HAVE/HAVE NOT:19434} improved since completing therapy.  Symptoms of bladder changes {ACTIONS; HAVE/HAVE WNU:80565} improved since completing therapy. Current symptoms include ***, and medications for bladder symptoms include ***.  Symptoms of bowel changes {ACTIONS; HAVE/HAVE NOT:19434} improved since completing therapy. Current symptoms include ***, and medications for bowel symptoms include ***.   Post Treatment IPSS Score: IPSS Questionnaire (AUA-7): Over the past month   1)  How often have you had a sensation of not emptying your bladder completely after you finish urinating?  {Rating:19227}  2)  How often have you had to urinate again less than two hours after you finished urinating? {Rating:19227}  3)  How often have you found you stopped and started again several times when you urinated?  {Rating:19227}  4) How difficult have you found it to postpone urination?  {Rating:19227}  5) How often have you had a weak urinary stream?  {Rating:19227}  6) How often have you had to push or strain to begin  urination?  {Rating:19227}  7) How many times did you most typically get up to urinate from the time you went to bed until the time you got up in the morning?  {Rating:19228}  Total score:  {0-35:19561} Which indicates {Mild/Moderate/Severe:20260} symptoms  0-7 mildly symptomatic   8-19 moderately symptomatic   20-35 severely symptomatic   Patient (has a scheduled follow up visit with his urologist, Dr. Adine Manly, on ***/ does not currently have any scheduled follow up with his urologist to his knowledge so he was advised to call [facility] to schedule his post-treatment follow up with Dr. GARLON for ongoing surveillance. He was counseled that PSA levels will be drawn in the urology office, and was reassured that additional time is expected to improve bowel and bladder symptoms. He was encouraged to call back with concerns or questions regarding radiation.   "

## 2024-11-03 ENCOUNTER — Ambulatory Visit

## 2024-11-09 ENCOUNTER — Ambulatory Visit: Admitting: Pulmonary Disease

## 2024-12-28 ENCOUNTER — Encounter: Admitting: Family Medicine
